# Patient Record
Sex: Male | Born: 1960 | Race: White | Hispanic: No | Marital: Single | Smoking: Current every day smoker
Health system: Southern US, Community
[De-identification: ages and names within clinical notes are randomized; demographics above are authoritative.]

## PROBLEM LIST (undated history)

## (undated) DIAGNOSIS — F32A Depression, unspecified: Secondary | ICD-10-CM

## (undated) DIAGNOSIS — I2510 Atherosclerotic heart disease of native coronary artery without angina pectoris: Secondary | ICD-10-CM

## (undated) DIAGNOSIS — Z8489 Family history of other specified conditions: Secondary | ICD-10-CM

## (undated) DIAGNOSIS — Z950 Presence of cardiac pacemaker: Secondary | ICD-10-CM

## (undated) DIAGNOSIS — K219 Gastro-esophageal reflux disease without esophagitis: Secondary | ICD-10-CM

## (undated) DIAGNOSIS — I10 Essential (primary) hypertension: Secondary | ICD-10-CM

## (undated) DIAGNOSIS — E039 Hypothyroidism, unspecified: Secondary | ICD-10-CM

## (undated) DIAGNOSIS — F329 Major depressive disorder, single episode, unspecified: Secondary | ICD-10-CM

## (undated) DIAGNOSIS — IMO0002 Reserved for concepts with insufficient information to code with codable children: Secondary | ICD-10-CM

## (undated) DIAGNOSIS — E785 Hyperlipidemia, unspecified: Secondary | ICD-10-CM

## (undated) HISTORY — PX: LUMBAR MICRODISCECTOMY: SHX99

## (undated) HISTORY — DX: Atherosclerotic heart disease of native coronary artery without angina pectoris: I25.10

## (undated) HISTORY — PX: APPENDECTOMY: SHX54

## (undated) HISTORY — DX: Presence of cardiac pacemaker: Z95.0

## (undated) HISTORY — DX: Hyperlipidemia, unspecified: E78.5

## (undated) HISTORY — PX: ABDOMINOPLASTY: SUR9

## (undated) HISTORY — PX: SQUAMOUS CELL CARCINOMA EXCISION: SHX2433

## (undated) HISTORY — DX: Hypothyroidism, unspecified: E03.9

## (undated) HISTORY — PX: TONSILLECTOMY: SUR1361

## (undated) HISTORY — DX: Depression, unspecified: F32.A

## (undated) HISTORY — PX: BACK SURGERY: SHX140

## (undated) HISTORY — DX: Major depressive disorder, single episode, unspecified: F32.9

---

## 1998-12-18 ENCOUNTER — Ambulatory Visit (HOSPITAL_COMMUNITY): Admission: RE | Admit: 1998-12-18 | Discharge: 1998-12-18 | Payer: Self-pay | Admitting: *Deleted

## 2000-03-05 ENCOUNTER — Ambulatory Visit (HOSPITAL_COMMUNITY): Admission: RE | Admit: 2000-03-05 | Discharge: 2000-03-05 | Payer: Self-pay | Admitting: *Deleted

## 2002-03-26 ENCOUNTER — Encounter: Payer: Self-pay | Admitting: Family Medicine

## 2002-03-26 ENCOUNTER — Inpatient Hospital Stay (HOSPITAL_COMMUNITY): Admission: AD | Admit: 2002-03-26 | Discharge: 2002-03-28 | Payer: Self-pay | Admitting: Family Medicine

## 2002-03-27 HISTORY — PX: CARDIAC CATHETERIZATION: SHX172

## 2002-09-26 ENCOUNTER — Encounter: Payer: Self-pay | Admitting: Emergency Medicine

## 2002-09-26 ENCOUNTER — Emergency Department (HOSPITAL_COMMUNITY): Admission: EM | Admit: 2002-09-26 | Discharge: 2002-09-26 | Payer: Self-pay | Admitting: Emergency Medicine

## 2004-05-13 ENCOUNTER — Ambulatory Visit (HOSPITAL_COMMUNITY): Admission: RE | Admit: 2004-05-13 | Discharge: 2004-05-13 | Payer: Self-pay | Admitting: Family Medicine

## 2005-08-26 ENCOUNTER — Ambulatory Visit (HOSPITAL_COMMUNITY): Admission: RE | Admit: 2005-08-26 | Discharge: 2005-08-26 | Payer: Self-pay | Admitting: Neurosurgery

## 2005-09-14 ENCOUNTER — Ambulatory Visit (HOSPITAL_COMMUNITY): Admission: RE | Admit: 2005-09-14 | Discharge: 2005-09-15 | Payer: Self-pay | Admitting: Neurosurgery

## 2005-11-02 ENCOUNTER — Ambulatory Visit (HOSPITAL_COMMUNITY): Admission: RE | Admit: 2005-11-02 | Discharge: 2005-11-02 | Payer: Self-pay | Admitting: Neurosurgery

## 2005-11-09 ENCOUNTER — Ambulatory Visit (HOSPITAL_COMMUNITY): Admission: RE | Admit: 2005-11-09 | Discharge: 2005-11-10 | Payer: Self-pay | Admitting: Neurosurgery

## 2008-02-24 ENCOUNTER — Encounter: Admission: RE | Admit: 2008-02-24 | Discharge: 2008-02-24 | Payer: Self-pay | Admitting: Neurosurgery

## 2009-11-27 HISTORY — PX: APPENDECTOMY: SHX54

## 2010-09-15 ENCOUNTER — Ambulatory Visit (HOSPITAL_COMMUNITY): Admission: RE | Admit: 2010-09-15 | Discharge: 2010-09-15 | Payer: Self-pay | Admitting: General Surgery

## 2010-09-15 ENCOUNTER — Inpatient Hospital Stay (HOSPITAL_COMMUNITY): Admission: AD | Admit: 2010-09-15 | Discharge: 2010-09-17 | Payer: Self-pay | Admitting: General Surgery

## 2010-09-16 ENCOUNTER — Encounter (INDEPENDENT_AMBULATORY_CARE_PROVIDER_SITE_OTHER): Payer: Self-pay | Admitting: General Surgery

## 2011-02-08 LAB — CBC
HCT: 38.1 % — ABNORMAL LOW (ref 39.0–52.0)
HCT: 40.6 % (ref 39.0–52.0)
Hemoglobin: 13.2 g/dL (ref 13.0–17.0)
Hemoglobin: 13.9 g/dL (ref 13.0–17.0)
MCH: 32.7 pg (ref 26.0–34.0)
MCH: 32.8 pg (ref 26.0–34.0)
MCHC: 34.4 g/dL (ref 30.0–36.0)
MCHC: 34.5 g/dL (ref 30.0–36.0)
MCV: 94.8 fL (ref 78.0–100.0)
MCV: 95.3 fL (ref 78.0–100.0)
Platelets: 226 10*3/uL (ref 150–400)
Platelets: 275 10*3/uL (ref 150–400)
RBC: 4.02 MIL/uL — ABNORMAL LOW (ref 4.22–5.81)
RBC: 4.26 MIL/uL (ref 4.22–5.81)
RDW: 12.9 % (ref 11.5–15.5)
RDW: 13.1 % (ref 11.5–15.5)
WBC: 8.1 10*3/uL (ref 4.0–10.5)
WBC: 9.3 10*3/uL (ref 4.0–10.5)

## 2011-02-08 LAB — DIFFERENTIAL
Basophils Absolute: 0 10*3/uL (ref 0.0–0.1)
Basophils Absolute: 0 10*3/uL (ref 0.0–0.1)
Basophils Relative: 0 % (ref 0–1)
Basophils Relative: 1 % (ref 0–1)
Eosinophils Absolute: 0.2 10*3/uL (ref 0.0–0.7)
Eosinophils Absolute: 0.2 10*3/uL (ref 0.0–0.7)
Eosinophils Relative: 2 % (ref 0–5)
Eosinophils Relative: 2 % (ref 0–5)
Lymphocytes Relative: 19 % (ref 12–46)
Lymphocytes Relative: 25 % (ref 12–46)
Lymphs Abs: 1.8 10*3/uL (ref 0.7–4.0)
Lymphs Abs: 2.1 10*3/uL (ref 0.7–4.0)
Monocytes Absolute: 0.6 10*3/uL (ref 0.1–1.0)
Monocytes Absolute: 1 10*3/uL (ref 0.1–1.0)
Monocytes Relative: 11 % (ref 3–12)
Monocytes Relative: 7 % (ref 3–12)
Neutro Abs: 5.3 10*3/uL (ref 1.7–7.7)
Neutro Abs: 6.2 10*3/uL (ref 1.7–7.7)
Neutrophils Relative %: 65 % (ref 43–77)
Neutrophils Relative %: 67 % (ref 43–77)

## 2011-02-08 LAB — BASIC METABOLIC PANEL
BUN: 11 mg/dL (ref 6–23)
BUN: 7 mg/dL (ref 6–23)
CO2: 24 mEq/L (ref 19–32)
CO2: 25 mEq/L (ref 19–32)
Calcium: 8.7 mg/dL (ref 8.4–10.5)
Calcium: 9.3 mg/dL (ref 8.4–10.5)
Chloride: 104 mEq/L (ref 96–112)
Chloride: 108 mEq/L (ref 96–112)
Creatinine, Ser: 0.86 mg/dL (ref 0.4–1.5)
Creatinine, Ser: 0.87 mg/dL (ref 0.4–1.5)
GFR calc Af Amer: >60 mL/min (ref >60)
GFR calc Af Amer: >60 mL/min (ref >60)
GFR calc non Af Amer: >60 mL/min (ref >60)
GFR calc non Af Amer: >60 mL/min (ref >60)
Glucose, Bld: 94 mg/dL (ref 70–99)
Glucose, Bld: 98 mg/dL (ref 70–99)
Potassium: 4 mEq/L (ref 3.5–5.1)
Potassium: 4.2 mEq/L (ref 3.5–5.1)
Sodium: 137 mEq/L (ref 135–145)
Sodium: 139 mEq/L (ref 135–145)

## 2011-04-14 NOTE — Op Note (Signed)
NAMETIMM, BONENBERGER               ACCOUNT NO.:  [id number]   MEDICAL RECORD NO.:  [id number]          PATIENT TYPE:  AMB   LOCATION:  SDS                          FACILITY:  [facility]   PHYSICIAN:  Danae Orleans. Venetia Maxon, M.D.  DATE OF BIRTH:  August 29, 1961   DATE OF PROCEDURE:  09/14/2005  DATE OF DISCHARGE:                                 OPERATIVE REPORT   PREOPERATIVE DIAGNOSIS:  L4-5 herniated lumbar disk on the left with  spondylosis, degenerative disk disease and lumbar radiculopathy.   POSTOPERATIVE DIAGNOSIS:  L4-5 herniated lumbar disk on the left with  spondylosis, degenerative disk disease and lumbar radiculopathy.   OPERATION PERFORMED:  Left L4-5 microdiskectomy with microdissection.   SURGEON:  Danae Orleans. Venetia Maxon, M.D.   ANESTHESIA:  General endotracheal.   ESTIMATED BLOOD LOSS:  Minimal.   COMPLICATIONS:  None.   DISPOSITION:  Recovery.   INDICATIONS FOR PROCEDURE:  Bill Castro is a 50 year old man with a large  free fragment disk herniation at the L4-5 level on the left.  It was elected  to take him to surgery for microdiskectomy.   DESCRIPTION OF PROCEDURE:  Bill Castro was brought to the operating room.  Following the satisfactory and uncomplicated induction of general  endotracheal anesthesia and placement of intravenous lines, the patient was  placed in a prone position on the Wilson frame.  The low back was then  prepped and draped in the usual sterile fashion.  The area of planned  incision was infiltrated with 0.25% Marcaine, 0.5% lidocaine, 1:200,000  epinephrine.  A small incision was made in the midline overlying the L4-5  interspace and carried through subcutaneous fat to the lumbodorsal fascia  which was incised on the left side of midline.  Subperiosteal dissection was  performed exposing the  interspace.  A marker probe was placed at this level  and intraoperative x-ray confirmed this to be the correct level.  Using a  high speed drill under loupe  magnification, a laminotomy was performed of L4  as well as a medial facetectomy.  A foraminotomy was performed overlying the  L5 nerve root with the removal by Kerrison rongeur of the superior aspect of  the L5 lamina.  The ligamentum flavum was then detached and removed in  piecemeal fashion using a variety of Kerrison rongeurs.  The lateral recess  was identified and in this location there was a fairly large fragment of  herniated disk material.  A microscope was brought into the field and using  microdissection technique, the L5 nerve root was mobilized medially and  multiple fragments of disk material were removed with resultant significant  decompression of the L5 nerve root and thecal sac.  There was a small  spondylitic ridge that remained but there did not appear to be free disk  material and there did not appear to be any significant residual compression  of the nerve root.  The annulus was inspected and while there was a bulge of  the annulus, there did not appear to be significant nerve root compression  from this and consequently, it was elected not to  incise the annulus and  remove any additional disk material.  Hemostasis was assured.  The wound was  copiously irrigated with bacitracin saline.  The nerve root was then bathed  in 2 mL of fentanyl and 80 mg of Depo-Medrol and the self-retaining  retractor was removed.  The lumbodorsal fascia was closed with 0 Vicryl  suture.  The subcutaneous tissue were reapproximated with 2-0 Vicryl  interrupted inverted sutures and the skin edges were reapproximated with  interrupted 3-0 Vicryl interrupted inverted sutures.  The wound was dressed  with DermaBond.  The patient was extubated in the operating room and taken  to the recovery room in stable and satisfactory condition having tolerated  the operation well.  Counts were correct at the end of the case.      Danae Orleans. Venetia Maxon, M.D.  Electronically Signed     JDS/MEDQ  D:   09/14/2005  T:  09/14/2005  Job:  [id number]

## 2011-04-14 NOTE — H&P (Signed)
[facility]  Patient:    Bill Castro, Bill Castro Visit Number: [id number] MRN: [id number]          Service Type: MED Location: [phone] Attending Physician:  Virgina Evener Dictated by:   Colette Ribas, M.D. Admit Date:  03/27/2002                           History and Physical  ADMISSION DIAGNOSIS:  Chest pain.  PRIMARY PHYSICIAN:  [facility].  HISTORY OF PRESENT ILLNESS:  [age]-year-old gentleman, who yesterday afternoon had acute onset of left-sided chest pain radiating to the neck, associated nausea, some diaphoresis and shortness of breath.  Seemed to last all evening and when he went to bed it was still present.  It was quite intense at first. He felt this could be just stomach related and did not think much about it, but almost came to the emergency department.  Today he came to the doctors office completely chest pain-free, and no chest pain all day today.  He had been taking over-the-counter cold medicines for a little bit of nasal congestion.  He has been hypertensive in the past but blood pressure well controlled now that he has lost 70 pounds.  Strong family history of coronary artery disease and he is a smoker, as well as has a history of hyperlipidemia.  In the office he was quite stable, with no complaints whatsoever, but on EKG showed ST elevation in pericordial leads as well as J-point elevation and peaked T waves.  He had some inferior lead elevation as well.  Due to these changes it was felt that he could have had a myocardial event and he must be admitted to the hospital.  PAST MEDICAL HISTORY:  Hypertension.  PAST SURGICAL HISTORY:  "Tumor" removed from the left side of his skin ten years ago.  MEDICATIONS:  None.  ALLERGIES:  1. PENICILLIN.  2. ERYTHROMYCIN.  SOCIAL HISTORY:  Tobacco use.  No alcohol use.  No cocaine use.  Occupation, works Armed forces operational officer for [employer].  FAMILY HISTORY:  Quite strong  for coronary artery disease and CVAs.  PHYSICAL EXAMINATION:  VITAL SIGNS:  Blood pressure 110/80, pulse 72, respirations 18, TEMP 98.0 degrees.  Weight 217 pounds.  GENERAL:  Pleasant, talkative gentleman in no acute distress.  HEENT:  Normocephalic, atraumatic.  PERRL.  EOMI.  Nasopharynx and oropharynx clear.  NECK:  Supple.  CHEST:  Clear to auscultation bilaterally.  CARDIOVASCULAR:  Regular rate and rhythm.  Normal S1 and S2.  No lifts, heaves, gallops or rubs.  ABDOMEN:  Bowel sounds positive.  Soft, nontender, nondistended.  No hepatosplenomegaly.  No masses.  EXTREMITIES:  No clubbing, cyanosis, or edema.  LABORATORY DATA:  ECGs as stated above.  ASSESSMENT:  [age]-year-old gentleman with chest pain.  Rule out myocardial cause.  PLAN:  1. Admit for monitoring and serial enzymes, CPK, troponin, MB x3.  2. ECG daily.  3. Monitor.  4. Aspirin 325 mg q.d.  5. Heparinize.  6. Consult Dr. Domingo Sep, who will see the patient this afternoon and     will discuss with her further interventions. Dictated by:   Colette Ribas, M.D. Attending Physician:  Virgina Evener DD:  03/26/02 TD:  03/27/02 Job: [id number]

## 2011-04-14 NOTE — Op Note (Signed)
NAMEMICHIO, THIER               ACCOUNT NO.:  [id number]   MEDICAL RECORD NO.:  [id number]          PATIENT TYPE:  OIB   LOCATION:  3007                         FACILITY:  [facility]   PHYSICIAN:  Danae Orleans. Venetia Maxon, M.D.  DATE OF BIRTH:  August 23, 1961   DATE OF PROCEDURE:  11/09/2005  DATE OF DISCHARGE:  11/10/2005                                 OPERATIVE REPORT   PREOPERATIVE DIAGNOSIS:  Recurrent herniated lumbar disc L4-L5 left with  spondylosis, degenerative disc disease, and radiculopathy.   POSTOPERATIVE DIAGNOSIS:  Recurrent herniated lumbar disc L4-L5 left with  spondylosis, degenerative disc disease, and radiculopathy.   PROCEDURE:  Redo left L4-L5 microdiscectomy with microdissection.   SURGEON:  Danae Orleans. Venetia Maxon, M.D.   ASSISTANT:  Stefani Dama, M.D.   ANESTHESIA:  General endotracheal anesthesia.   ESTIMATED BLOOD LOSS:  Minimal.   COMPLICATIONS:  None.   DISPOSITION:  Recovery room.   INDICATIONS FOR PROCEDURE:  Bill Castro is a 50 year old man who  previously underwent left L4-L5 microdiscectomy on September 14, 2005, for a  large free fragment herniated disc at the L4-L5 level on the left.  He did  well postoperatively, but then had sudden recurrence of pain and underwent a  repeat MRI which showed a recurrent disc fragment.  At the time of the  initial surgery, the annulus was preserved and the fragment was removed  without more aggressive discectomy.  However, because of the large recurrent  disc herniation and the significant disc degeneration at this level, it was  felt appropriate at this point to remove recurrent disc material but also to  perform a discectomy with removal of disc material from the interspace, as  well.   DESCRIPTION OF PROCEDURE:  Bill Castro is brought to the operating room.  Following satisfactory uncomplicated induction of general endotracheal  anesthesia and the placement of intravenous lines, the patient was placed in  a prone  position on the Wilson frame.  His low back was then prepped and  draped in the usual sterile fashion.  The area of planned incision was  infiltrated with 0.25% Marcaine and 0.5% lidocaine with 1:200,000  epinephrine.  His previous incision was reopened and carried through about 2  inches of adipost tissue to the lumbodorsal fascia which was incised on the  left side of the midline.  Subperiosteal dissection was performed exposing  the L4 and L5 interspace.  A laminotomy defect was identified at this level  and a marker probe was placed and an interoperative x-ray confirmed correct  orientation at the L4-L5 level.  Subsequently, the lateral recess was  further decompressed with Kerrison rongeurs and laminotomy defect was  carried cephalad, somewhat.  The lateral thecal sac was identified.  There  was a fragment of herniated disc material in the lateral recess directly  compressing the L5 nerve root.  The fragment of disc material was removed  which resulted in significant decompression of the thecal sac and nerve  root.  The annulus was then inspected.  There was a hole in the annulus from  which the fragment had emanated and  the annulus appeared to be fairly  degenerated.  It was, therefore, elected to perform an annulotomy and to  remove the disc material from the interspace, as well.  An annulotomy was  performed with the retractor protecting the nerve root and the common dural  tube.  This was done using micro-dissection technique to mobilize the sac  and nerve root.  Subsequently, after the annulutotomy was performed, the  interspace was evacuated of disc material using a variety of pituitary  rongeurs.  A Coblation wand was then used at standard settings to coblate  the remaining loose disc material and this was used both medially and  laterally and subsequently residual disc material was removed with pituitary  rongeurs and curets were used to remove any remaining disc material that  was  felt to cause significant persistent compression.  The wound was then  copiously irrigated with Bacitracin saline and the operative bed was bathed  in 2 mL of Fentanyl and 80 mg Depo-Medrol.  The self-retaining retractor was  then removed.  The lumbodorsal fascia was closed with #1 Vicryl sutures, the  subcutaneous tissues were reapproximated with 2-0 Vicryl interrupted  inverted sutures, and the skin edges were reapproximated with interrupted 3-  0 Vicryl subcuticular stitch.  The wound was dressed with Dermabond.  The  patient was extubated in the operating room and taken to the recovery room  in stable condition having tolerated the operation well.  Counts were  correct at the end of the case.      Danae Orleans. Venetia Maxon, M.D.  Electronically Signed     JDS/MEDQ  D:  11/09/2005  T:  11/10/2005  Job:  [id number]

## 2011-04-14 NOTE — Discharge Summary (Signed)
[facility]  Patient:    Bill Castro, Bill Castro Visit Number: [id number] MRN: [id number]          Service Type: MED Location: [phone] Attending Physician:  Heinz Knuckles Dictated by:   Colette Ribas, M.D. Admit Date:  03/27/2002 Disc. Date: 03/27/02                             Discharge Summary  TRANSFER DIAGNOSES:  Chest pain, rule out myocardial infarction.  HISTORY OF PRESENT ILLNESS/PAST MEDICAL HISTORY:  Please see admission H&P.  HOSPITAL COURSE:  A 50 year old gentleman who had acute onset of left-sided substernal chest pain with associated symptoms.  He was put in for rule out protocol.  Dr. Domingo Sep and I talked about the patient right at admission and were strongly concerned that this could be cardiac.  We were quite surprised that both sets of enzymes at this point have been negative.  Despite this he was set up for transfer to [facility] for cardiac catheterization as the ECG changes and symptomatology is quite suspicious.  Vital signs have remained stable.  He has remained chest pain free overnight and is ready for transfer to [facility] today.  Please see progress note on day of transfer for laboratories and physical.  Transfer to [facility] today. Dictated by:   Colette Ribas, M.D. Attending Physician:  Heinz Knuckles DD:  03/27/02 TD:  03/28/02 Job: [id number]

## 2011-04-14 NOTE — Procedures (Signed)
[facility]  Patient:    Bill Castro, Bill Castro Visit Number: [id number] MRN: [id number]          Service Type: MED Location: [phone] Attending Physician:  Heinz Knuckles Dictated by:   Kari Baars, M.D. Proc. Date: 03/26/02 Admit Date:  03/27/2002                            EKG Interpretations  The rhythm is a sinus rhythm with a rate in the 70s.  There is a left ventricular hypertrophy by voltage criteria.  There is relatively wide spread ST-T wave changes which could be myocardial infarction, early repolarization. There are Q-waves inferiorly which are prominent in limb lead 3.  This could also be from inferior myocardial infarction.  Clinical correlation is suggested.  Abnormal electrocardiogram. Dictated by:   Kari Baars, M.D. Attending Physician:  Heinz Knuckles DD:  03/26/02 TD:  03/28/02 Job: [id number]

## 2011-04-14 NOTE — Discharge Summary (Signed)
[place]  Patient:    Bill Castro, Bill Castro Visit Number: [id number] MRN: [id number]          Service Type: MED Location: [phone] Attending Physician:  Virgina Evener Dictated by:   Marya Fossa, P.A. Admit Date:  03/27/2002 Discharge Date: 03/28/2002   CC:         Dr. Lenore Cordia, [place]   Discharge Summary  DATE OF BIRTH:  Dec 18, 1960  ADMISSION DIAGNOSES: 1. Chest pain, rule out myocardial infarction. 2. Abnormal EKG. 3. Hypertension. 4. History of obsessive-compulsive disorder. 5. Herniated disk. 6. Unknown lipid status. 7. Ongoing tobacco abuse.  DISCHARGE DIAGNOSES: 1. Chest pain, resolved, myocardial infarction ruled out with negative    enzymes status post cardiac catheterization revealing noncritical    coronary artery disease.  LV dysfunction 30-35% ejection fraction.    Medical therapy. 2. Abnormal EKG. 3. Hypertension. 4. History of obsessive-compulsive disorder. 5. Herniated disk. 6. Unknown lipid status. 7. Ongoing tobacco abuse - counseled on smoking cessation.  HISTORY OF PRESENT ILLNESS:  Bill Castro is a 50 year old male with OCD, hypertension, and herniated disk who experienced the first episode of chest pain around three oclock in the afternoon on March 25, 2002.  It continued all night.  At worst, it was 10/10.  In the morning, he began having recurrent chest pain and went and saw Dr. Zollie Pee.  He had shortness of breath, diaphoresis, and radiation to the left chest and bilaterally to the neck.  Dr. Zollie Pee asked Dr. Domingo Sep to evaluate the patient at [facility].  At that time, he was currently pain-free.  His cardiac risk factors include tobacco, hypertension, unknown lipid status, and family history.  EKG in the emergency room shows normal sinus rhythm with changes suggestive of an old inferior MI, and mild ST elevation diffusely.  Initial cardiac enzymes are pending.  Because  of the patients recent symptoms of chest pain and abnormal EKG, Dr. Domingo Sep feels it prudent to evaluate for unstable angina, rule out MI.  She will transfer him to [facility] for cardiac catheterization. IV heparin and IV nitroglycerin started, as well as beta-blocker and statins if LFTs okay.  PROCEDURE:  Cardiac catheterization by Dr. Lenise Herald Mar 27, 2002.  COMPLICATIONS:  None.  CONSULTATIONS:  None.  COURSE IN HOSPITAL:  Bill Castro was admitted to [facility] as a transfer from [facility] on Mar 27, 2002.  He was on IV heparin and IV nitroglycerin, and started on a beta-blocker as well as aspirin.  PREPROCEDURE LABORATORY STUDIES:  Potassium 3.9, BUN 11, creatinine 1.0.  LFTs within normal limits.  WBC 8.2, hemoglobin 14.7, and platelet 243.  INR 1.5. Cardiac enzymes negative.  PROCEDURE:  The patient was taken to the cardiac cath lab on Mar 27, 2002 by Dr. Lenise Herald.  This revealed a normal left main and normal LAD. Diagonal one had a 30% ostial lesion.  The circumflex was widely patent. There was a 20% ostial/proximal OM lesion.  The RCA was free of disease except at its distal-most portion.  Two small branches had 90% lesions.  EF, however, was low at 30-35% with global hypokinesis.  Dr. Jenne Campus recommended medical therapy of coronary artery disease.  He feels these small vessels are not responsible for the patients chest pain, however, if the patient has recurrent or calcitrant symptoms, we may need to attempt intervention.  He also does not feel that the small vessels are responsible for  his LV dysfunction and this is an idiopathic cardiomyopathy or cardiomyopathy of some other cause, possibly hypertension.  The patient tolerated the procedure well. On Mar 28, 2002, the patient continued to be stable.  BUN 11, creatinine 1.2, total cholesterol 159, triglycerides 118, HDL 45, and LDL 90.  The patient was felt stable for discharge to home.  A  ______ consult will be obtained before the patient is discharged.  He will be also seen by cardiac rehab.  DISCHARGE MEDICATIONS: 1. Coated aspirin 81 mg q.d. 2. Coreg 3.125 mg b.i.d. 3. Lanoxin 0.125 mg a day. 4. Altace 2.5 mg b.i.d. 5. Zocor 10 mg q.d. 6. Nitroglycerin as needed for chest pain.  DISCHARGE INSTRUCTIONS: 1. No strenuous activity, lifting more than five pounds, or driving for    two days. 2. May return to work Tuesday, Apr 01, 2002 without restrictions. 3. He is to stop smoking. 4. Low fat, low cholesterol, 2 gram, salt restriction. 5. May shower.  Do not soak in a tub for three days. 6. He is asked to call the office with any problems or questions.  Dr. Domingo Sep will see the patient back in followup on Thursday, May 15 at 9:45.  Of note, the new closure device was used, a Medtronic, on this patient and the groin remained stable. Dictated by:   Marya Fossa, P.A. Attending Physician:  Virgina Evener DD:  03/28/02 TD:  03/31/02 Job: [id number]

## 2011-04-14 NOTE — Cardiovascular Report (Signed)
[place]  Patient:    Bill Castro, Bill Castro Visit Number: [id number] MRN: [id number]          Service Type: MED Location: [phone] Attending Physician:  Virgina Evener Dictated by:   Lenise Herald, M.D. Proc. Date: 03/27/02 Admit Date:  03/27/2002 Discharge Date: 03/28/2002   CC:         Netta Cedars, M.D.   Cardiac Catheterization  PROCEDURES: 1. Left heart catheterization. 2. Coronary angiography. 3. Left ventriculogram.  ATTENDING:  Lenise Herald, M.D.  COMPLICATIONS:  None.  INDICATIONS:  Bill Castro is a 50 year old male patient of Dr. Kem Boroughs and [facility] with a history of recent chest pain.  He was seen at [facility] ER with mild ST elevation consistent with pericarditis; however, his symptoms were consistent with unstable angina.  He ruled out for myocardial infarction.  He is now referred for cardiac catheterization to define his coronary anatomy.  DESCRIPTION OF PROCEDURE:  After giving informed written consent, the patient was brought to the cardiac catheterization lab where his right and left groins were shaved, prepped, and draped in the usual sterile fashion.  ECG monitoring was established.  Using the modified Seldinger technique, a #6 French arterial sheath was inserted in the right femoral artery.  A #6 French diagnostic catheter was then used to perform diagnostic angiography.  CATHETERIZATION FINDINGS: 1. This revealed a large left main with no significant disease.  2. The LAD is a large vessel which coursed to the apex and gave rise to 1    diagonal branch.  The LAD has no significant disease.  The first diagonal    is a large vessel which bifurcates distally and has mild 30% ostial    narrowing.  3. The left coronary artery gives rise to a medium sized ramus intermedius    with no significant disease.  4. The left circumflex is a large vessel which coursed in the AV groove and   gave rise to 2 obtuse marginal branches.  The AV groove circumflex has no    significant disease.  The first OM is a large vessel which bifurcates    distally and has 20% proximal narrowing.  The second OM is a small vessel    with no significant disease.  5. The right coronary artery is a large vessel which is dominant and gives    rise to both PDA as well as a posterolateral branch.  The RCA has no    significant disease.  The PDA is a small vessel which bifurcates in the mid    segment.  Both of the distal segments of the PDA have 90% stenosis.  This    is a 1.5 to 2.0 vessel.  The PLA has no significant disease.  LEFT VENTRICULOGRAM:  Left ventriculogram reveals an EF of 30-35% with global hypokinesis.  HEMODYNAMICS:  Systemic arterial pressure 97/62.  LV systemic pressure 101/6. LVEDP of 14.  CONCLUSIONS:  Significant one-vessel coronary artery disease which appears to be a very small vessel and is not responsible for the patients low ejection fraction.  Medical management will be recommended at this time; however, should the symptoms persist, percutaneous coronary intervention could be attempted of his posterior descending artery. 2. Dictated by:   Lenise Herald, M.D. Attending Physician:  Virgina Evener DD:  03/27/02 TD:  03/29/02 Job: [id number]

## 2012-01-19 ENCOUNTER — Emergency Department (INDEPENDENT_AMBULATORY_CARE_PROVIDER_SITE_OTHER): Payer: BC Managed Care – PPO

## 2012-01-19 ENCOUNTER — Encounter (HOSPITAL_COMMUNITY): Payer: Self-pay | Admitting: *Deleted

## 2012-01-19 ENCOUNTER — Emergency Department (INDEPENDENT_AMBULATORY_CARE_PROVIDER_SITE_OTHER)
Admission: EM | Admit: 2012-01-19 | Discharge: 2012-01-19 | Disposition: A | Discharge status: Home / Self Care | Payer: BC Managed Care – PPO | Source: Home / Self Care | Attending: Emergency Medicine | Admitting: Emergency Medicine

## 2012-01-19 DIAGNOSIS — S62639A Displaced fracture of distal phalanx of unspecified finger, initial encounter for closed fracture: Secondary | ICD-10-CM

## 2012-01-19 NOTE — Progress Notes (Signed)
Orthopedic Tech Progress Note Patient Details:  Bill Castro 28-Jul-1961 [id number]  Type of Splint: Other (comment) (ulnar gutter) Splint Location: right hand Splint Interventions: Application    Nikki Dom 01/19/2012, 8:57 PM

## 2012-01-19 NOTE — Discharge Instructions (Signed)
Thank you for coming in today. You have a fracture of the base of the little finger, that involves the joint.  I would like you to see the orthopedic doctor preferably a hand doctor next week early.   Do not take the splint off. Use Tylenol or ibuprofen as needed for pain.  Finger Fracture (Phalangeal) A broken bone of the finger (phalangealfracture) is a common injury for athletes. A single injury (trauma) is likely to fracture multiple bones on the same or different fingers. SYMPTOMS   Severe pain, at the time of injury.   Pain, tenderness, swelling, and later bruising of the finger and then the hand.   Visible deformity, if the fracture is complete and the bone fragments separate enough to distort the normal shape.   Numbness or coldness from swelling in the finger, causing pressure on blood vessels or nerves (uncommon).  CAUSES  Direct or indirect injury (trauma) to the finger.  RISK INCREASES WITH:   Contact sports (football, rugby) or other sports where injury to the hand is likely (soccer, baseball, basketball).   Sports that require hitting (boxing, martial arts).   History of bone or joint disease, such as osteoporosis, or previous bone restraint.   Poor hand strength and flexibility.  PREVENTION   For contact sports, wear appropriate and properly fitted protective equipment for the hand.   Learn and use proper technique when hitting, punching, or landing after a fall.   If you had a previous finger injury or hand restraint, use tape or padding to protect the finger when playing sports where finger injury is likely.  PROGNOSIS  With proper treatment and normal alignment of the bones, healing can usually be expected in 4 to 6 weeks. Sometimes, surgery is needed.  RELATED COMPLICATIONS   Fracture does not heal (nonunion).   Bone heals in wrong position (malunion).   Chronic pain, stiffness, or swelling of the hand.   Excessive bleeding, causing pressure on nerves  and blood vessels.   Unstable or arthritic joint, following repeated injury or delayed treatment.   Hindrance of normal growth in children.   Infection in skin broken over the fracture (open fracture) or at the incision or pin sites from surgery.   Shortening of injured bones.   Bony bumps or loss of shape of the fingers.   Arthritic or stiff finger joint, if the fracture reaches the joint.  TREATMENT  If the bones are properly aligned, treatment involves ice and medicine to reduce pain and inflammation. Then, the finger is restrained for 4 or more weeks, to allow for healing. If the fracture is out of alignment (displaced), involves more than one bone, or involves a joint, surgery is usually advised. Surgery often involves placing removable pins, screws, and sometimes plates, to hold the bones in proper alignment. After restraint (with or without surgery), stretching and strengthening exercises are needed. Exercises may be completed at home or with a therapist. For certain sports, wearing a splint or having the finger taped during future activity is advised.  MEDICATION   If pain medicine is needed, nonsteroidal anti-inflammatory medicines (aspirin and ibuprofen), or other minor pain relievers (acetaminophen), are often advised.   Do not take pain medicine for 7 days before surgery.   Prescription pain relievers are usually prescribed only after surgery. Use only as directed and only as much as you need.  COLD THERAPY   Cold treatment (icing) relieves pain and reduces inflammation. Cold treatment should be applied for 10 to 15  minutes every 2 to 3 hours, and immediately after activity that aggravates your symptoms. Use ice packs or an ice massage.  SEEK MEDICAL CARE IF:   Pain, tenderness, or swelling gets worse, despite treatment.   You experience pain, numbness, or coldness in the hand.   Blue, gray, or dark color appears in the fingernails.   Any of the following occur after  surgery: fever, increased pain, swelling, redness, drainage of fluids, or bleeding in the affected area.   New, unexplained symptoms develop. (Drugs used in treatment may produce side effects.)  Document Released: 11/13/2005 Document Revised: 07/26/2011 Document Reviewed: 02/25/2009 [facility] Patient Information 2012 [place], [place].

## 2012-01-19 NOTE — ED Provider Notes (Signed)
Bill Castro is a 51 y.o. male who presents to Urgent Care today for today we'll leave in the vets office with his dog he tried to stop a door from closing with his right hand.  The metal bar on the door crush his were signed of his right hand resulting in immediate pain and swelling.  He has normal sensation to his fingertips but has noted some swelling.  He can move his hand normally although it does have pain. He denies any wrist pain or difficulty with motion.    PMH reviewed. Significant for hypertension ROS as above otherwise neg Medications reviewed. No current facility-administered medications for this encounter.   Current Outpatient Prescriptions  Medication Sig Dispense Refill  . aspirin 81 MG tablet Take 81 mg by mouth daily.      . ramipril (ALTACE) 5 MG tablet Take 5 mg by mouth daily.        Exam:  There were no vitals taken for this visit. Gen: Well NAD MSK: Fingers are neurovascularly intact with normal capillary refill.  Tender to palpation on fourth and fifth metacarpophalangeal joints. Tender to palpation on the distal fourth and fifth metacarpals.  Normal hand motion. Normal finger alignment with  a closed hand.  Hand x-ray shows small proximal right fifth phalangeal fracture involving the articular surface  Assessment and Plan: 51 year old male with proximal articular fifth phalangeal fracture.  Placed in ulnar gutter splint and referred to orthopedics (hand) for definitive management. I provided instructions and handout. Patient expresses understanding.     Clementeen Graham, MD 01/19/12 2059

## 2012-01-19 NOTE — ED Notes (Signed)
Pt slammed right hand in door.

## 2012-08-15 ENCOUNTER — Other Ambulatory Visit (HOSPITAL_COMMUNITY): Payer: Self-pay | Admitting: Internal Medicine

## 2012-08-15 ENCOUNTER — Ambulatory Visit (HOSPITAL_COMMUNITY)
Admission: RE | Admit: 2012-08-15 | Discharge: 2012-08-15 | Disposition: A | Discharge status: Home / Self Care | Payer: BC Managed Care – PPO | Source: Ambulatory Visit | Attending: Internal Medicine | Admitting: Internal Medicine

## 2012-08-15 DIAGNOSIS — R059 Cough, unspecified: Secondary | ICD-10-CM | POA: Insufficient documentation

## 2012-08-15 DIAGNOSIS — R05 Cough: Secondary | ICD-10-CM

## 2012-08-15 DIAGNOSIS — J069 Acute upper respiratory infection, unspecified: Secondary | ICD-10-CM | POA: Insufficient documentation

## 2014-03-30 ENCOUNTER — Emergency Department (HOSPITAL_COMMUNITY)
Admission: EM | Admit: 2014-03-30 | Discharge: 2014-03-30 | Disposition: A | Discharge status: Home / Self Care | Payer: BC Managed Care – PPO | Source: Home / Self Care | Attending: Emergency Medicine | Admitting: Emergency Medicine

## 2014-03-30 ENCOUNTER — Encounter (HOSPITAL_COMMUNITY): Payer: Self-pay | Admitting: Emergency Medicine

## 2014-03-30 DIAGNOSIS — J329 Chronic sinusitis, unspecified: Secondary | ICD-10-CM

## 2014-03-30 MED ORDER — SULFAMETHOXAZOLE-TMP DS 800-160 MG PO TABS: 1.0000 | ORAL_TABLET | Freq: Two times a day (BID) | ORAL | Status: DC

## 2014-03-30 NOTE — ED Provider Notes (Signed)
Medical screening examination/treatment/procedure(s) were performed by non-physician practitioner and as supervising physician I was immediately available for consultation/collaboration.  Enrique Manganaro, M.D.  Tawn Fitzner C [place] Whedbee, MD 03/30/14 1934 

## 2014-03-30 NOTE — Discharge Instructions (Signed)

## 2014-03-30 NOTE — ED Provider Notes (Signed)
CSN: [id number]     Arrival date & time 03/30/14  0918 History   First MD Initiated Contact with Patient 03/30/14 [phone]     Chief Complaint  Patient presents with  . Cough   (Consider location/radiation/quality/duration/timing/severity/associated sxs/prior Treatment) Patient is a 53 y.o. male presenting with cough. The history is provided by the patient. No language interpreter was used.  Cough Cough characteristics:  Productive and harsh Sputum characteristics:  Yellow Severity:  Moderate Onset quality:  Gradual Duration:  3 days Timing:  Constant Progression:  Worsening Chronicity:  New Smoker: no   Relieved by:  Nothing Worsened by:  Nothing tried Ineffective treatments:  None tried Associated symptoms: fever and sinus congestion   Associated symptoms: no chest pain     Past Medical History  Diagnosis Date  . Cardiomyopathy    Past Surgical History  Procedure Laterality Date  . Appendectomy     History reviewed. No pertinent family history. History  Substance Use Topics  . Smoking status: Current Every Day Smoker  . Smokeless tobacco: Not on file  . Alcohol Use: No    Review of Systems  Constitutional: Positive for fever.  Respiratory: Positive for cough.   Cardiovascular: Negative for chest pain.  All other systems reviewed and are negative.   Allergies  Erythromycin and Penicillins  Home Medications   Prior to Admission medications   Medication Sig Start Date End Date Taking? Authorizing Provider  BusPIRone HCl (BUSPAR PO) Take by mouth.   Yes Historical Provider, MD  Escitalopram Oxalate (LEXAPRO PO) Take by mouth.   Yes Historical Provider, MD  Levothyroxine Sodium (SYNTHROID PO) Take by mouth.   Yes Historical Provider, MD  aspirin 81 MG tablet Take 81 mg by mouth daily.    Historical Provider, MD  ramipril (ALTACE) 5 MG tablet Take 5 mg by mouth daily.    Historical Provider, MD   BP 132/78  Pulse 78  Temp(Src) 98.6 F (37 C) (Oral)  Resp 18   SpO2 100% Physical Exam  Nursing note and vitals reviewed. Constitutional: He is oriented to person, place, and time. He appears well-developed and well-nourished.  HENT:  Head: Normocephalic and atraumatic.  Right Ear: External ear normal.  Left Ear: External ear normal.  Nose: Nose normal.  Mouth/Throat: Oropharynx is clear and moist.  Tender maxillary sinuses bilat  Eyes: EOM are normal. Pupils are equal, round, and reactive to light.  Neck: Normal range of motion.  Pulmonary/Chest: Effort normal.  Abdominal: Soft. He exhibits no distension.  Musculoskeletal: Normal range of motion.  Neurological: He is alert and oriented to person, place, and time.  Skin: Skin is warm.  Psychiatric: He has a normal mood and affect.    ED Course  Procedures (including critical care time) Labs Review Labs Reviewed - No data to display  Imaging Review No results found.   MDM   1. Sinusitis    Bactrim DS [address]20    Lonia SkinnerLeslie K KensingtonSofia, New JerseyPA-C 03/30/14 1006

## 2014-03-30 NOTE — ED Notes (Signed)
Pt   Reports     Symptoms  Of         Congestion       Sinus  Drainage        And      Nasal  stuffyness     For  About 3  Days       Pt  Sitting  Upright on the  Exam table  Speaking in  Complete  sentances  And  Is  In no  Acute  Distress

## 2015-12-30 ENCOUNTER — Ambulatory Visit: Payer: Self-pay | Admitting: Cardiology

## 2016-02-18 ENCOUNTER — Encounter: Payer: Self-pay | Admitting: *Deleted

## 2016-02-21 ENCOUNTER — Ambulatory Visit: Payer: Self-pay | Admitting: Cardiology

## 2016-04-11 ENCOUNTER — Ambulatory Visit: Payer: Self-pay | Admitting: Cardiology

## 2017-09-09 ENCOUNTER — Encounter (HOSPITAL_COMMUNITY): Payer: Self-pay | Admitting: Emergency Medicine

## 2017-09-09 ENCOUNTER — Ambulatory Visit (HOSPITAL_COMMUNITY)
Admission: EM | Admit: 2017-09-09 | Discharge: 2017-09-09 | Disposition: A | Discharge status: Home / Self Care | Payer: Commercial Managed Care - PPO | Attending: Family Medicine | Admitting: Family Medicine

## 2017-09-09 DIAGNOSIS — S060X0A Concussion without loss of consciousness, initial encounter: Secondary | ICD-10-CM

## 2017-09-09 DIAGNOSIS — R51 Headache: Secondary | ICD-10-CM

## 2017-09-09 DIAGNOSIS — W228XXA Striking against or struck by other objects, initial encounter: Secondary | ICD-10-CM | POA: Diagnosis not present

## 2017-09-09 DIAGNOSIS — S0101XA Laceration without foreign body of scalp, initial encounter: Secondary | ICD-10-CM

## 2017-09-09 NOTE — ED Triage Notes (Signed)
Pt reports head inj last night ... sts he was throwing out the trash and bend over but when he raised his head he hit the corner of the cabinet  Has a small lac to top of scalp... Bleeding controlled... Denies LOC  C/o persistent HA, ringing on right ear.   A&O x4... NAD... Ambulatory

## 2017-09-09 NOTE — ED Provider Notes (Signed)
[facility]   [id number] 09/09/17 Arrival Time: 1201   SUBJECTIVE:  Bill Castro is a 56 y.o. male who presents to the urgent care with complaint of scalp laceration.  He had some bleeding and a persistent headache which is not worsening, not associated with N/V, and not associated with focal weakness.  He has had some tinnitus in the right ear.  He was standing up when he struck the left parietal area on a cabinet.  He did not sleep well despite taking Tylenol for pain.  No diplopia.  Patient does office work   Past Medical History:  Diagnosis Date  . Cardiomyopathy   . Depressive disorder   . Hyperlipidemia   . Hypothyroidism    Family History  Problem Relation Age of Onset  . Heart disease Father    Social History   Social History  . Marital status: Single    Spouse name: N/A  . Number of children: N/A  . Years of education: N/A   Occupational History  . Not on file.   Social History Main Topics  . Smoking status: Current Every Day Smoker  . Smokeless tobacco: Not on file  . Alcohol use No  . Drug use: No  . Sexual activity: Not on file   Other Topics Concern  . Not on file   Social History Narrative  . No narrative on file   No outpatient prescriptions have been marked as taking for the 09/09/17 encounter [facility] Encounter).   Allergies  Allergen Reactions  . Erythromycin   . Penicillins       ROS: As per HPI, remainder of ROS negative.   OBJECTIVE:   There were no vitals filed for this visit.   General appearance: alert; no distress Eyes: PERRL; EOMI; conjunctiva normal; normal fundi HENT: normocephalic; atraumatic; TMs normal, canal normal, external ears normal without trauma; nasal mucosa normal; oral mucosa normal Neck: supple Lungs:no respiratory distress Back: no CVA tenderness Extremities: no cyanosis or edema; symmetrical with no gross deformities Skin: warm and dry, dry 1/2 cm left parietal scalp  lac Neurologic: normal gait; grossly normal; CN III-XII intact Psychological: alert and cooperative; normal mood and affect      Labs:  Results for orders placed or performed during the hospital encounter of 54/27/06  Basic metabolic panel  Result Value Ref Range   Sodium 139 135 - 145 mEq/L   Potassium 4.2 3.5 - 5.1 mEq/L   Chloride 108 96 - 112 mEq/L   CO2 24 19 - 32 mEq/L   Glucose, Bld 98 70 - 99 mg/dL   BUN 11 6 - 23 mg/dL   Creatinine, Ser 0.86 0.4 - 1.5 mg/dL   Calcium 9.3 8.4 - 10.5 mg/dL   GFR calc non Af Amer >60 >60 mL/min   GFR calc Af Amer  >60 mL/min    >60        The eGFR has been calculated using the MDRD equation. This calculation has not been validated in all clinical situations. eGFR's persistently <60 mL/min signify possible Chronic Kidney Disease.  CBC  Result Value Ref Range   WBC 8.1 4.0 - 10.5 K/uL   RBC 4.26 4.22 - 5.81 MIL/uL   Hemoglobin 13.9 13.0 - 17.0 g/dL   HCT 40.6 39.0 - 52.0 %   MCV 95.3 78.0 - 100.0 fL   MCH 32.8 26.0 - 34.0 pg   MCHC 34.4 30.0 - 36.0 g/dL   RDW 12.9 11.5 - 15.5 %  Platelets 275 150 - 400 K/uL  Differential  Result Value Ref Range   Neutrophils Relative % 65 43 - 77 %   Neutro Abs 5.3 1.7 - 7.7 K/uL   Lymphocytes Relative 25 12 - 46 %   Lymphs Abs 2.1 0.7 - 4.0 K/uL   Monocytes Relative 7 3 - 12 %   Monocytes Absolute 0.6 0.1 - 1.0 K/uL   Eosinophils Relative 2 0 - 5 %   Eosinophils Absolute 0.2 0.0 - 0.7 K/uL   Basophils Relative 0 0 - 1 %   Basophils Absolute 0.0 0.0 - 0.1 K/uL  Basic metabolic panel  Result Value Ref Range   Sodium 137 135 - 145 mEq/L   Potassium 4.0 3.5 - 5.1 mEq/L   Chloride 104 96 - 112 mEq/L   CO2 25 19 - 32 mEq/L   Glucose, Bld 94 70 - 99 mg/dL   BUN 7 6 - 23 mg/dL   Creatinine, Ser 0.87 0.4 - 1.5 mg/dL   Calcium 8.7 8.4 - 10.5 mg/dL   GFR calc non Af Amer >60 >60 mL/min   GFR calc Af Amer  >60 mL/min    >60        The eGFR has been calculated using the MDRD  equation. This calculation has not been validated in all clinical situations. eGFR's persistently <60 mL/min signify possible Chronic Kidney Disease.  CBC  Result Value Ref Range   WBC 9.3 4.0 - 10.5 K/uL   RBC 4.02 (L) 4.22 - 5.81 MIL/uL   Hemoglobin 13.2 13.0 - 17.0 g/dL   HCT 38.1 (L) 39.0 - 52.0 %   MCV 94.8 78.0 - 100.0 fL   MCH 32.7 26.0 - 34.0 pg   MCHC 34.5 30.0 - 36.0 g/dL   RDW 13.1 11.5 - 15.5 %   Platelets 226 150 - 400 K/uL  Differential  Result Value Ref Range   Neutrophils Relative % 67 43 - 77 %   Neutro Abs 6.2 1.7 - 7.7 K/uL   Lymphocytes Relative 19 12 - 46 %   Lymphs Abs 1.8 0.7 - 4.0 K/uL   Monocytes Relative 11 3 - 12 %   Monocytes Absolute 1.0 0.1 - 1.0 K/uL   Eosinophils Relative 2 0 - 5 %   Eosinophils Absolute 0.2 0.0 - 0.7 K/uL   Basophils Relative 1 0 - 1 %   Basophils Absolute 0.0 0.0 - 0.1 K/uL    Labs Reviewed - No data to display  No results found.     ASSESSMENT & PLAN:  No diagnosis found.  No orders of the defined types were placed in this encounter.   Reviewed expectations re: course of current medical issues. Questions answered. Outlined signs and symptoms indicating need for more acute intervention. Patient verbalized understanding. After Visit Summary given.    Procedures:      Robyn Haber, MD 09/09/17 1234

## 2017-09-09 NOTE — Discharge Instructions (Signed)
No stressful work for several days.  Only work 6 hours tomorrow.  If headache worsens or new symptoms develop, go to emergency department for follow up CAT scan.  Tylenol may be used for pain up to 2000 mg per day in divided doses.

## 2017-12-19 ENCOUNTER — Encounter (HOSPITAL_COMMUNITY)
Admission: EM | Disposition: A | Discharge status: Home / Self Care | Payer: Self-pay | Source: Home / Self Care | Attending: Emergency Medicine

## 2017-12-19 ENCOUNTER — Observation Stay (HOSPITAL_COMMUNITY)
Admission: EM | Admit: 2017-12-19 | Discharge: 2017-12-20 | Disposition: A | Discharge status: Home / Self Care | Payer: Commercial Managed Care - PPO | Attending: Internal Medicine | Admitting: Internal Medicine

## 2017-12-19 ENCOUNTER — Inpatient Hospital Stay (HOSPITAL_COMMUNITY): Payer: Commercial Managed Care - PPO

## 2017-12-19 ENCOUNTER — Other Ambulatory Visit: Payer: Self-pay

## 2017-12-19 ENCOUNTER — Emergency Department (HOSPITAL_COMMUNITY): Payer: Commercial Managed Care - PPO

## 2017-12-19 ENCOUNTER — Encounter (HOSPITAL_COMMUNITY): Payer: Self-pay

## 2017-12-19 DIAGNOSIS — E039 Hypothyroidism, unspecified: Secondary | ICD-10-CM | POA: Insufficient documentation

## 2017-12-19 DIAGNOSIS — I442 Atrioventricular block, complete: Secondary | ICD-10-CM

## 2017-12-19 DIAGNOSIS — F17210 Nicotine dependence, cigarettes, uncomplicated: Secondary | ICD-10-CM | POA: Diagnosis not present

## 2017-12-19 DIAGNOSIS — Z79890 Hormone replacement therapy (postmenopausal): Secondary | ICD-10-CM | POA: Diagnosis not present

## 2017-12-19 DIAGNOSIS — R202 Paresthesia of skin: Secondary | ICD-10-CM

## 2017-12-19 DIAGNOSIS — R29810 Facial weakness: Secondary | ICD-10-CM | POA: Insufficient documentation

## 2017-12-19 DIAGNOSIS — I469 Cardiac arrest, cause unspecified: Secondary | ICD-10-CM

## 2017-12-19 DIAGNOSIS — E785 Hyperlipidemia, unspecified: Secondary | ICD-10-CM | POA: Diagnosis not present

## 2017-12-19 DIAGNOSIS — Z880 Allergy status to penicillin: Secondary | ICD-10-CM | POA: Diagnosis not present

## 2017-12-19 DIAGNOSIS — Z79899 Other long term (current) drug therapy: Secondary | ICD-10-CM | POA: Insufficient documentation

## 2017-12-19 DIAGNOSIS — Z881 Allergy status to other antibiotic agents status: Secondary | ICD-10-CM | POA: Insufficient documentation

## 2017-12-19 DIAGNOSIS — R55 Syncope and collapse: Secondary | ICD-10-CM

## 2017-12-19 DIAGNOSIS — Z950 Presence of cardiac pacemaker: Secondary | ICD-10-CM

## 2017-12-19 DIAGNOSIS — I429 Cardiomyopathy, unspecified: Secondary | ICD-10-CM | POA: Insufficient documentation

## 2017-12-19 HISTORY — DX: Reserved for concepts with insufficient information to code with codable children: IMO0002

## 2017-12-19 HISTORY — PX: INSERT / REPLACE / REMOVE PACEMAKER: SUR710

## 2017-12-19 HISTORY — DX: Presence of cardiac pacemaker: Z95.0

## 2017-12-19 HISTORY — DX: Family history of other specified conditions: Z84.89

## 2017-12-19 HISTORY — PX: PACEMAKER IMPLANT: EP1218

## 2017-12-19 LAB — CBC
HCT: 45.5 % (ref 39.0–52.0)
Hemoglobin: 15.2 g/dL (ref 13.0–17.0)
MCH: 31.7 pg (ref 26.0–34.0)
MCHC: 33.4 g/dL (ref 30.0–36.0)
MCV: 95 fL (ref 78.0–100.0)
Platelets: 300 10*3/uL (ref 150–400)
RBC: 4.79 MIL/uL (ref 4.22–5.81)
RDW: 12.9 % (ref 11.5–15.5)
WBC: 9.6 10*3/uL (ref 4.0–10.5)

## 2017-12-19 LAB — COMPREHENSIVE METABOLIC PANEL
ALT: 21 U/L (ref 17–63)
AST: 20 U/L (ref 15–41)
Albumin: 4.2 g/dL (ref 3.5–5.0)
Alkaline Phosphatase: 77 U/L (ref 38–126)
Anion gap: 11 (ref 5–15)
BUN: 12 mg/dL (ref 6–20)
CO2: 23 mmol/L (ref 22–32)
Calcium: 8.9 mg/dL (ref 8.9–10.3)
Chloride: 105 mmol/L (ref 101–111)
Creatinine, Ser: 0.88 mg/dL (ref 0.61–1.24)
GFR calc Af Amer: >60 mL/min (ref >60)
GFR calc non Af Amer: >60 mL/min (ref >60)
Glucose, Bld: 120 mg/dL — ABNORMAL HIGH (ref 65–99)
Potassium: 3.5 mmol/L (ref 3.5–5.1)
Sodium: 139 mmol/L (ref 135–145)
Total Bilirubin: 0.2 mg/dL — ABNORMAL LOW (ref 0.3–1.2)
Total Protein: 7.3 g/dL (ref 6.5–8.1)

## 2017-12-19 LAB — I-STAT CHEM 8, ED
BUN: 10 mg/dL (ref 6–20)
Calcium, Ion: 1.11 mmol/L — ABNORMAL LOW (ref 1.15–1.40)
Chloride: 106 mmol/L (ref 101–111)
Creatinine, Ser: 0.9 mg/dL (ref 0.61–1.24)
Glucose, Bld: 115 mg/dL — ABNORMAL HIGH (ref 65–99)
HCT: 47 % (ref 39.0–52.0)
Hemoglobin: 16 g/dL (ref 13.0–17.0)
Potassium: 3.6 mmol/L (ref 3.5–5.1)
Sodium: 142 mmol/L (ref 135–145)
TCO2: 23 mmol/L (ref 22–32)

## 2017-12-19 LAB — DIFFERENTIAL
Basophils Absolute: 0.1 10*3/uL (ref 0.0–0.1)
Basophils Relative: 1 %
Eosinophils Absolute: 0.4 10*3/uL (ref 0.0–0.7)
Eosinophils Relative: 4 %
Lymphocytes Relative: 34 %
Lymphs Abs: 3.3 10*3/uL (ref 0.7–4.0)
Monocytes Absolute: 0.8 10*3/uL (ref 0.1–1.0)
Monocytes Relative: 8 %
Neutro Abs: 5.1 10*3/uL (ref 1.7–7.7)
Neutrophils Relative %: 53 %

## 2017-12-19 LAB — MAGNESIUM: Magnesium: 2.2 mg/dL (ref 1.7–2.4)

## 2017-12-19 LAB — APTT: aPTT: 26 seconds (ref 24–36)

## 2017-12-19 LAB — ECHOCARDIOGRAM COMPLETE
Height: 72 in
Weight: 3200 oz

## 2017-12-19 LAB — PROTIME-INR
INR: 0.88
Prothrombin Time: 11.9 seconds (ref 11.4–15.2)

## 2017-12-19 LAB — I-STAT TROPONIN, ED: Troponin i, poc: 0.01 ng/mL (ref 0.00–0.08)

## 2017-12-19 LAB — T4, FREE: Free T4: 0.77 ng/dL (ref 0.61–1.12)

## 2017-12-19 LAB — TSH: TSH: 3.883 u[IU]/mL (ref 0.350–4.500)

## 2017-12-19 LAB — ETHANOL: Alcohol, Ethyl (B): <10 mg/dL (ref <10)

## 2017-12-19 SURGERY — PACEMAKER IMPLANT

## 2017-12-19 MED ORDER — MIDAZOLAM HCL 5 MG/5ML IJ SOLN
INTRAMUSCULAR | Status: AC
  Filled 2017-12-19: qty 5

## 2017-12-19 MED ORDER — FENTANYL CITRATE (PF) 100 MCG/2ML IJ SOLN
INTRAMUSCULAR | Status: AC
  Filled 2017-12-19: qty 2

## 2017-12-19 MED ORDER — VANCOMYCIN HCL IN DEXTROSE 1-5 GM/200ML-% IV SOLN
1000.0000 mg | Freq: Two times a day (BID) | INTRAVENOUS | Status: AC
  Administered 2017-12-20: 1000 mg via INTRAVENOUS
  Filled 2017-12-19: qty 200

## 2017-12-19 MED ORDER — SODIUM CHLORIDE 0.9% FLUSH: 3.0000 mL | Freq: Two times a day (BID) | INTRAVENOUS | Status: DC

## 2017-12-19 MED ORDER — LIDOCAINE HCL 1 % IJ SOLN
INTRAMUSCULAR | Status: AC
  Filled 2017-12-19: qty 20

## 2017-12-19 MED ORDER — YOU HAVE A PACEMAKER BOOK
Freq: Once | Status: AC
  Administered 2017-12-20: 06:00:00
  Filled 2017-12-19: qty 1

## 2017-12-19 MED ORDER — SODIUM CHLORIDE 0.9 % IV SOLN: INTRAVENOUS | Status: DC

## 2017-12-19 MED ORDER — POTASSIUM CHLORIDE 10 MEQ/100ML IV SOLN
10.0000 meq | Freq: Once | INTRAVENOUS | Status: AC
  Administered 2017-12-19: 10 meq via INTRAVENOUS
  Filled 2017-12-19: qty 100

## 2017-12-19 MED ORDER — FENTANYL CITRATE (PF) 100 MCG/2ML IJ SOLN
INTRAMUSCULAR | Status: DC | PRN
  Administered 2017-12-19 (×3): 25 ug via INTRAVENOUS

## 2017-12-19 MED ORDER — MIDAZOLAM HCL 5 MG/5ML IJ SOLN
INTRAMUSCULAR | Status: DC | PRN
  Administered 2017-12-19: 2 mg via INTRAVENOUS

## 2017-12-19 MED ORDER — ASPIRIN EC 81 MG PO TBEC: 81.0000 mg | DELAYED_RELEASE_TABLET | Freq: Every day | ORAL | Status: DC

## 2017-12-19 MED ORDER — HEPARIN (PORCINE) IN NACL 2-0.9 UNIT/ML-% IJ SOLN
INTRAMUSCULAR | Status: AC
  Filled 2017-12-19: qty 500

## 2017-12-19 MED ORDER — MIDAZOLAM HCL 5 MG/5ML IJ SOLN
INTRAMUSCULAR | Status: DC | PRN
  Administered 2017-12-19 (×2): 2 mg via INTRAVENOUS

## 2017-12-19 MED ORDER — SODIUM CHLORIDE 0.9 % IV SOLN: 250.0000 mL | INTRAVENOUS | Status: DC | PRN

## 2017-12-19 MED ORDER — SODIUM CHLORIDE 0.9% FLUSH: 3.0000 mL | INTRAVENOUS | Status: DC | PRN

## 2017-12-19 MED ORDER — ACETAMINOPHEN 325 MG PO TABS
650.0000 mg | ORAL_TABLET | ORAL | Status: DC | PRN
  Administered 2017-12-19: 650 mg via ORAL
  Filled 2017-12-19 (×2): qty 2

## 2017-12-19 MED ORDER — LIDOCAINE HCL 1 % IJ SOLN
INTRAMUSCULAR | Status: AC
  Filled 2017-12-19: qty 40

## 2017-12-19 MED ORDER — ACETAMINOPHEN 325 MG PO TABS
325.0000 mg | ORAL_TABLET | ORAL | Status: DC | PRN
  Administered 2017-12-20: 08:00:00 650 mg via ORAL

## 2017-12-19 MED ORDER — VANCOMYCIN HCL IN DEXTROSE 1-5 GM/200ML-% IV SOLN
1000.0000 mg | INTRAVENOUS | Status: AC
  Administered 2017-12-19: 1000 mg via INTRAVENOUS

## 2017-12-19 MED ORDER — CHLORHEXIDINE GLUCONATE 4 % EX LIQD: 60.0000 mL | Freq: Once | CUTANEOUS | Status: DC

## 2017-12-19 MED ORDER — ESCITALOPRAM OXALATE 20 MG PO TABS
20.0000 mg | ORAL_TABLET | Freq: Every day | ORAL | Status: DC
  Filled 2017-12-19: qty 1

## 2017-12-19 MED ORDER — DOBUTAMINE IN D5W 4-5 MG/ML-% IV SOLN
2.5000 ug/kg/min | INTRAVENOUS | Status: DC
  Administered 2017-12-19: 2.5 ug/kg/min via INTRAVENOUS

## 2017-12-19 MED ORDER — LEVOTHYROXINE SODIUM 50 MCG PO TABS: 50.0000 ug | ORAL_TABLET | Freq: Every day | ORAL | Status: DC

## 2017-12-19 MED ORDER — SODIUM CHLORIDE 0.9 % IV SOLN: 250.0000 mL | INTRAVENOUS | Status: DC

## 2017-12-19 MED ORDER — VANCOMYCIN HCL IN DEXTROSE 1-5 GM/200ML-% IV SOLN
INTRAVENOUS | Status: AC
  Filled 2017-12-19: qty 200

## 2017-12-19 MED ORDER — SODIUM CHLORIDE 0.9 % IR SOLN
80.0000 mg | Status: AC
  Administered 2017-12-19: 80 mg

## 2017-12-19 MED ORDER — ONDANSETRON HCL 4 MG/2ML IJ SOLN: 4.0000 mg | Freq: Four times a day (QID) | INTRAMUSCULAR | Status: DC | PRN

## 2017-12-19 MED ORDER — LIDOCAINE HCL (PF) 1 % IJ SOLN
INTRAMUSCULAR | Status: DC | PRN
  Administered 2017-12-19: 50 mL

## 2017-12-19 MED ORDER — DOBUTAMINE IN D5W 4-5 MG/ML-% IV SOLN
INTRAVENOUS | Status: AC
  Filled 2017-12-19: qty 250

## 2017-12-19 MED ORDER — HEPARIN (PORCINE) IN NACL 2-0.9 UNIT/ML-% IJ SOLN
INTRAMUSCULAR | Status: AC | PRN
  Administered 2017-12-19: 500 mL

## 2017-12-19 SURGICAL SUPPLY — 8 items
CABLE SURGICAL S-101-97-12 (CABLE) ×2 IMPLANT
IPG PACE AZUR XT DR MRI W1DR01 (Pacemaker) ×1 IMPLANT
LEAD CAPSURE NOVUS 5076-52CM (Lead) ×2 IMPLANT
LEAD CAPSURE NOVUS 5076-58CM (Lead) ×2 IMPLANT
PACE AZURE XT DR MRI W1DR01 (Pacemaker) ×2 IMPLANT
PAD DEFIB LIFELINK (PAD) ×2 IMPLANT
SHEATH CLASSIC 7F (SHEATH) ×4 IMPLANT
TRAY PACEMAKER INSERTION (PACKS) ×2 IMPLANT

## 2017-12-19 NOTE — ED Notes (Signed)
Pt awake at this time.  No distress.  Cardiology at bedside.  Orders received.

## 2017-12-19 NOTE — H&P (Signed)
Cardiology Admission History and Physical:   Patient ID: Bill Castro; MRN: [id number]; DOB: 1961/07/08   Admission date: 12/19/2017  Primary Care Provider: Assunta FoundGolding, John, MD Primary Cardiologist: Former pt of Bill Castro, [facility], Bill Castro  Chief Complaint:  Syncope, asystole  Patient Profile:   Bill Castro is a 57 y.o. male with a history of hypothyroidism, DCM, in review of Bill Castro's note, he was able to track down a note about LVEF 30-35% from 2003. Cath at that time without significant CAD, HLD.    History of Present Illness:   Bill Castro [facility] after a syncopal event at home, had warning with brief lightheadedness, [facility] he had 2 witnessed syncopal events associated with asystolic episodes.  In review of Bill Castro's note, once 17 seconds.  He was started on Dobutamine and [facility], and PPM implantation.  Echo was done on patient's arriveal, Bill Castro at bedside estimated LVEF at least 50-60%  He denies any illness of late, no CP, palpitations.  No prior hx of syncope.  LABS: K+ 3.6 BUN/Creat 10/0.90 Mag 2.2 poc Trop 0.01 WBC 9.6 H/H 16/47 Plts 300 TSH 2.883  Home meds reviewed, no nodal blocking/rate limiting agents   Past Medical History:  Diagnosis Date  . Cardiomyopathy   . Depressive disorder   . Hyperlipidemia   . Hypothyroidism     Past Surgical History:  Procedure Laterality Date  . APPENDECTOMY       Medications Prior to Admission: Prior to Admission medications   Medication Sig Start Date End Date Taking? Authorizing Provider  escitalopram (LEXAPRO) 20 MG tablet take 1 tablet once a day. 09/29/17  Yes [provider]  levothyroxine (SYNTHROID, LEVOTHROID) 50 MCG tablet take 1 tablet daily 09/29/17  Yes [provider]     Allergies:    Allergies  Allergen Reactions  . Erythromycin Swelling  . Penicillins     Has patient had a PCN  reaction causing immediate rash, facial/tongue/throat swelling, SOB or lightheadedness with hypotension: Unknown Has patient had a PCN reaction causing severe rash involving mucus membranes or skin necrosis: Unknown Has patient had a PCN reaction that required hospitalization: Unknown Has patient had a PCN reaction occurring within Bill last 10 years: No If all of Bill above answers are "NO", then may proceed with Cephalosporin use.     Social History:   Social History   Socioeconomic History  . Marital status: Single    Spouse name: Not on file  . Number of children: Not on file  . Years of education: Not on file  . Highest education level: Not on file  Social Needs  . Financial resource strain: Not on file  . Food insecurity - worry: Not on file  . Food insecurity - inability: Not on file  . Transportation needs - medical: Not on file  . Transportation needs - non-medical: Not on file  Occupational History  . Not on file  Tobacco Use  . Smoking status: Current Every Day Smoker  . Smokeless tobacco: Never Used  Substance and Sexual Activity  . Alcohol use: No  . Drug use: No  . Sexual activity: Not on file  Other Topics Concern  . Not on file  Social History Narrative  . Not on file    Family History:   Bill patient's family history includes Heart disease in [name].    ROS:  Please see Bill history of present illness.  All other ROS reviewed and negative.     Physical Exam/Data:   Vitals:   12/19/17 1320 12/19/17 1325 12/19/17 1330 12/19/17 1335  BP: (!) 192/93 (!) 169/86 (!) 162/76   Pulse: (!) 104 (!) 109 (!) 103 (!) 102  Resp: 13 20 (!) 27 (!) 24  SpO2: 100% 99% 99% 99%  Weight:      Height:        Intake/Output Summary (Last 24 hours) at 12/19/2017 1343 Last data filed at 12/19/2017 1300 Gross per 24 hour  Intake 104.42 ml  Output -  Net 104.42 ml   Filed Weights   12/19/17 1113  Weight: 200 lb (90.7 kg)   Body mass index is 27.12 kg/m.    General:  Well nourished, well developed, in no acute distress HEENT: normal Lymph: no adenopathy Neck: no JVD Endocrine:  No thryomegaly Vascular: No carotid bruits  Cardiac: RRR; tachycardic, no murmurs, gallops or rubs Lungs:  CTA b/l, no wheezing, rhonchi or rales  Abd: soft, nontender Ext: no edema Musculoskeletal:  No deformities, BUE and BLE strength normal and equal Skin: warm and dry  Neuro:  No focal abnormalities noted Psych:  Normal affect    EKG:  Bill ECG that was done was personally reviewed and demonstrates  SR 105bpm, PR , QRS 96ms, QTc Telemetry: currently SR/ST 100-105bpm  Relevant CV Studies:  Echo done, pending read  Laboratory Data:  Chemistry Recent Labs  Lab 12/19/17 1115 12/19/17 1131  NA 139 142  K 3.5 3.6  CL 105 106  CO2 23  --   GLUCOSE 120* 115*  BUN 12 10  CREATININE 0.88 0.90  CALCIUM 8.9  --   GFRNONAA >60  --   GFRAA >60  --   ANIONGAP 11  --     Recent Labs  Lab 12/19/17 1115  PROT 7.3  ALBUMIN 4.2  AST 20  ALT 21  ALKPHOS 77  BILITOT 0.2*   Hematology Recent Labs  Lab 12/19/17 1115 12/19/17 1131  WBC 9.6  --   RBC 4.79  --   HGB 15.2 16.0  HCT 45.5 47.0  MCV 95.0  --   MCH 31.7  --   MCHC 33.4  --   RDW 12.9  --   PLT 300  --    Cardiac EnzymesNo results for input(s): TROPONINI in Bill last 168 hours.  Recent Labs  Lab 12/19/17 1129  TROPIPOC 0.01    BNPNo results for input(s): BNP, PROBNP in Bill last 168 hours.  DDimer No results for input(s): DDIMER in Bill last 168 hours.  Radiology/Studies:  Dg Chest Port 1 View Result Date: 12/19/2017 CLINICAL DATA:  57 year old male with intermittent right face numbness today followed by syncopal episode, woke up on floor. EXAM: PORTABLE CHEST 1 VIEW COMPARISON:  08/15/2012 and earlier. FINDINGS: Portable AP upright view at 1138 hrs. Pacer or resuscitation pads project over Bill left chest. Normal cardiac size and mediastinal contours. Visualized tracheal  air column is within normal limits. Allowing for portable technique Bill lungs are clear. No acute osseous abnormality identified. IMPRESSION: Negative.  No acute cardiopulmonary abnormality. Electronically Signed   By: Odessa Fleming M.D.   On: 12/19/2017 11:59    Ct Head Code Stroke Wo Contrast Result Date: 12/19/2017 CLINICAL DATA:  Code stroke.  Right facial droop and numbness. EXAM: CT HEAD WITHOUT CONTRAST TECHNIQUE: Contiguous axial images were obtained from Bill base of Bill skull through  Bill vertex without intravenous contrast. COMPARISON:  None. FINDINGS: Brain: There is no evidence of acute infarct, intracranial hemorrhage, mass, midline shift, or extra-axial fluid collection. Bill ventricles and sulci are normal for age. Vascular: Mild calcified atherosclerosis in Bill carotid siphons. No hyperdense vessel. Skull: No fracture or focal osseous lesion. Sinuses/Orbits: Mild left frontal and ethmoid sinus mucosal thickening. Clear mastoid air cells. Unremarkable orbits. Other: None. ASPECTS [facility] Stroke Program Early CT Score) - Ganglionic level infarction (caudate, lentiform nuclei, internal capsule, insula, M1-M3 cortex): 7 - Supraganglionic infarction (M4-M6 cortex): 3 Total score (0-10 with 10 being normal): 10 IMPRESSION: 1. No evidence of acute intracranial abnormality. Unremarkable CT appearance of Bill brain. 2. ASPECTS is 10. These results were called by telephone at Bill time of interpretation on 12/19/2017 at 11:34 am to Dr. Samuel Jester , who verbally acknowledged these results. Electronically Signed   By: Sebastian Ache M.D.   On: 12/19/2017 11:34    Assessment and Plan:   1. Recurrent syncope 2. Asystole observed at [facility]  No reversible causes for sinus arrest/asystolic events, EKG is non-ischemic looking, no anginal complaints, TSH is wnl.    Dr. Ladona Ridgel has seen and examined Bill patient, discussed PPM implant procedure, risks and benefits, Bill patient is agreeable to proceed.     For  questions or updates, please contact [facility] Please consult [email or web address] for contact info under Cardiology/STEMI.    Signed, Sheilah Pigeon, PA-C  12/19/2017 1:43 PM   EP attending  Patient seen and examined.  Agree with Bill findings as noted above.  I have reviewed Bill documentation above and agree.  Bill patient has had recurrent syncope secondary to prolonged periods of asystole.  There has been transient complete heart block.  Bill patient's symptoms are not consistent with autonomic dysfunction although this still represents Bill most likely diagnosis.  There is no reversible features with regard to his treatment and for this reason I recommended insertion of a permanent dual-chamber pacemaker in hopes of preventing additional malignant neurally mediated syncope.  His EKG demonstrates sinus rhythm with no conduction abnormalities.  Review of his electrogram is demonstrates prolonged pauses of over 10 seconds.  I have discussed Bill treatment options with Bill patient.  Bill risks, goals, benefits, and expectations of pacemaker insertion were reviewed.  He wishes to proceed.  Of note, I have discussed Bill mechanism and physiology of neurally mediated syncope.  Despite successful pacemaker insertion, there is certainly a potential for recurrent syncope if his vaso-depression is not controlled.  Sharrell Ku, MD

## 2017-12-19 NOTE — Progress Notes (Signed)
CODE STROKE 1105 CALL 1110 EXAM STARTED 1112 EXAM FINISHED 1112 IMAGES SENT TO PACS 1118 EXAM COMPLETED IN EPIC 1120 [facility] CALLED

## 2017-12-19 NOTE — ED Notes (Signed)
Pt episode while getting off CT table where he felt like he was having another episode (like he had PTA).  Pt on cardiac monitor and went into asystole.  Pt unresponsive.  Code blue called and preparing to start compressions.  Pt became responsive while starting compressions.

## 2017-12-19 NOTE — ED Notes (Signed)
Pt awake at this time.  No distress.  Sinus tach on monitor  105 .

## 2017-12-19 NOTE — ED Notes (Signed)
Patient to CT.

## 2017-12-19 NOTE — ED Triage Notes (Addendum)
Patient reports of waking up at 1000 this morning and felt normal. States he went to get a cup of coffee at 1015 and felt numbness to right side of face.  States numbness went way  And came back a short time later, yet numbness is present again. Reports of going into garage and passed out and woke up in floor. Abrasion noted to right forearm. Patient has swelling/possible mild droop noted to right side of face.    EDP at bedside.

## 2017-12-19 NOTE — Plan of Care (Signed)
  Progressing Activity: Risk for activity intolerance will decrease 12/19/2017 2253 - Progressing by Leata MouseAninon, Sharla Tankard S, RN Pain Managment: General experience of comfort will improve 12/19/2017 2253 - Progressing by Leata MouseAninon, Evyn Putzier S, RN Safety: Ability to remain free from injury will improve 12/19/2017 2253 - Progressing by Leata MouseAninon, Kerby Borner S, RN Education: Knowledge of cardiac device and self-care will improve 12/19/2017 2253 - Progressing by Leata MouseAninon, Marie Chow S, RN Ability to safely manage health related needs after discharge will improve 12/19/2017 2253 - Progressing by Leata MouseAninon, Hicks Feick S, RN Cardiac: Ability to achieve and maintain adequate cardiopulmonary perfusion will improve 12/19/2017 2253 - Progressing by Leata MouseAninon, Khamauri Bauernfeind S, RN

## 2017-12-19 NOTE — Progress Notes (Signed)
  Echocardiogram 2D Echocardiogram has been performed.  Leta JunglingCooper, Rosemary Pentecost M 12/19/2017, 1:24 PM

## 2017-12-19 NOTE — Consult Note (Signed)
[facility] TeleNeurology Consult Services  Asked to see this patient in telemedicine consultation. Consultation was performed with assistance of ancillary/medical staff at bedside.  Comments: Last Known normal last night Door Time: 1059 [facility] Contacted: 1124 [facility] first log in: 1134 NIHSS assessment time: 1140 Needle Time: no iv tpa Call back time: 1140  HPI:  [id number] yom presents to hospital with concern for stroke sxs. He went to bed feeling fine but woke up this am at 915 with R facial numbness. He state he walked to the garage with recurrent sxs and woke up on the ground. He denies hx of seizures or stroke and in ED there was concern for few second of pause on telemetry per d/w nursing. CT scan head per radiology report is negative for acute process.  VSS  Gen Wn/Wd in Nad  TeleStroke Assessment: LOC:   0 LOC questions:  0 LOC Commands :   0 Gaze : 0 Visual fields :  0  Facial movements : 0 Upper limb Motor  0 Lower limb Motor  0 Limb Coordination  - 0 Sensory -  - 1 Language -  0 Speech -   0 Neglect / extinction -  0  NIHSS Score: 1    IMPRESSION  TIA/VBI Syncope concern for cardiac etiology  Differential Diagnosis: 1- Cardioembolic stroke 2- Small vessel disease 3- Thrombotic artery to artery mechanism 4- Hypercoagulable state related 5- Thrombotic large vessel disease  Blood Pressure and Blood Glucose within acceptable parameters.   Medical Decision Making:   Patient is not candidate for alteplase due to onset greater than 4.5hrs  Not an IR candidate as low clinical suspicion for LVO by neurologic assessment.   Recommendations: - Daily antithrombotics to initiate now if no contraindication.  - Further work up with Stroke labs, MRI brain, ECHO, NIVS Carotid will be deferred to inpt neurology service -  Needs Inpatient Neurology consultation and follow up  - Thank you for allowing us to participate in the care of your  patient, if there are any questions please don't hesitate to contact us  Discussed plan of care with patient/family/hospital staff   Physician: Terrace ArabiaMohammed Christyn Gutkowski, DO   [facility]

## 2017-12-19 NOTE — Progress Notes (Signed)
Full consult to follow. Contacted about multiple episodes of asystole in ER. Admitted with syncope at home. Documented episodes of asystole in ER, self recovered. Home meds are only lexapro and synthroid. Pads in place, starting dobutamine. Will need transfer to [facility] for temp wire placement, my PA is working to help make arrangements.   Dina RichJonathan Teaghan Melrose MD

## 2017-12-19 NOTE — ED Notes (Signed)
Back in ER with  doctor at bedside and pt had another episode where he went into asystole and was unresponsive.

## 2017-12-19 NOTE — ED Provider Notes (Signed)
[facility] EMERGENCY DEPARTMENT Provider Note   CSN: [id number] Arrival date & time: 12/19/17  1059   An emergency department physician performed an initial assessment on this suspected stroke patient at 1111.  History   Chief Complaint Chief Complaint  Patient presents with  . Code Stroke    HPI MATHAN DARROCH is a 57 y.o. male.  The history is provided by the patient. The history is limited by the condition of the patient (Urgent need for intervention).    Pt was seen at 1110.  Per pt, c/o sudden onset and resolution of 1 episodes of near syncope and 1 episode of syncope that occurred PTA. Pt states he woke up at 10am and "felt fine." Pt states at 1015am he felt his right lower face "go numb" and he felt lightheaded. Pt states this lasted approximately 5 minutes before resolving. This occurred again a short time later. Pt states the 3rd time this happened he "woke up on the floor in the garage."  Pt states he felt similar symptoms x1 a few months ago, but did not seek medical attention. Pt continues to have right sided facial "numbness." He states he currently does not feel lightheaded. Denies CP/palpitations, no SOB/cough, no abd pain, no N/V/D, no back pain, no neck pain, no headache, no slurred speech, no visual changes, no focal motor weakness, no tingling/numbness in extremities.   Past Medical History:  Diagnosis Date  . Cardiomyopathy   . Depressive disorder   . Hyperlipidemia   . Hypothyroidism     Patient Active Problem List   Diagnosis Date Noted  . Asystole (HCC) 12/19/2017    Past Surgical History:  Procedure Laterality Date  . APPENDECTOMY         Home Medications    Prior to Admission medications   Medication Sig Start Date End Date Taking? Authorizing Provider  escitalopram (LEXAPRO) 20 MG tablet take 1 tablet once a day. 09/29/17  Yes [provider]  levothyroxine (SYNTHROID, LEVOTHROID) 50 MCG tablet take 1 tablet daily 09/29/17  Yes  [provider]    Family History Family History  Problem Relation Age of Onset  . Heart disease Father     Social History Social History   Tobacco Use  . Smoking status: Current Every Day Smoker  . Smokeless tobacco: Never Used  Substance Use Topics  . Alcohol use: No  . Drug use: No     Allergies   Erythromycin and Penicillins   Review of Systems Review of Systems  Unable to perform ROS: Acuity of condition     Physical Exam Updated Vital Signs BP (!) 148/94   Pulse (!) 107   Resp (!) 29   Ht 6' (1.829 m)   Wt 90.7 kg (200 lb)   SpO2 100%   BMI 27.12 kg/m    Patient Vitals for the past 24 hrs:  BP Pulse Resp SpO2 Height Weight  12/19/17 1200 (!) 148/94 (!) 107 (!) 29 100 % - -  12/19/17 1145 (!) 153/90 (!) 101 (!) 27 100 % - -  12/19/17 1130 (!) 165/91 (!) 105 (!) 23 100 % - -  12/19/17 1129 - (!) 107 (!) 28 100 % - -  12/19/17 1128 138/89 (!) 108 (!) 27 100 % - -  12/19/17 1121 (!) 154/100 96 (!) 34 100 % - -  12/19/17 1115 (!) 121/91 (!) 108 (!) 24 100 % - -  12/19/17 1113 - - - - 6' (1.829 m) 90.7  kg (200 lb)  12/19/17 1110 138/84 (!) 105 18 100 % - -     Physical Exam 1110: Physical examination:  Nursing notes reviewed; Vital signs and O2 SAT reviewed;  Constitutional: Well developed, Well nourished, Well hydrated, In no acute distress; Head:  Normocephalic, atraumatic; Eyes: EOMI, PERRL, No scleral icterus; ENMT: Mouth and pharynx normal, Mucous membranes moist; Neck: Supple, Full range of motion, No lymphadenopathy; Cardiovascular: Tachycardic rate and rhythm, No gallop; Respiratory: Breath sounds clear & equal bilaterally, No wheezes.  Speaking full sentences with ease, Normal respiratory effort/excursion; Chest: Nontender, Movement normal; Abdomen: Soft, Nontender, Nondistended, Normal bowel sounds; Genitourinary: No CVA tenderness; Extremities: Pulses normal, +superficial abrasion right forearm. No tenderness, No edema, No calf edema or  asymmetry.; Neuro: AA&Ox3, Major CN grossly intact. Speech clear.  +right lower facial droop.  No nystagmus. Grips equal. Strength 5/5 equal bilat UE's and LE's.  DTR 2/4 equal bilat UE's and LE's.  +mild subjective decreased sensation right face, otherwise no gross sensory deficits.  Normal cerebellar testing bilat UE's (finger-nose) and LE's (heel-shin)..; Skin: Color normal, Warm, Dry.   ED Treatments / Results  Labs (all labs ordered are listed, but only abnormal results are displayed)   EKG  EKG Interpretation  Date/Time:  Wednesday December 19 2017 11:10:24 EST Ventricular Rate:  105 PR Interval:    QRS Duration: 96 QT Interval:  342 QTC Calculation: 452 R Axis:   19 Text Interpretation:  Sinus tachycardia Borderline prolonged PR interval Abnormal R-wave progression, early transition Artifact When compared with ECG of 09/15/2010 No significant change was found Confirmed by Samuel Jester [phone]) on 12/19/2017 11:20:02 AM       Radiology   Procedures Procedures (including critical care time)  Medications Ordered in ED Medications  potassium chloride 10 mEq in 100 mL IVPB (10 mEq Intravenous New Bag/Given 12/19/17 1150)  0.9 %  sodium chloride infusion (not administered)  DOBUTamine (DOBUTREX) infusion 4000 mcg/mL (2.5 mcg/kg/min  90.7 kg Intravenous New Bag/Given 12/19/17 1142)  aspirin EC tablet 81 mg (not administered)  acetaminophen (TYLENOL) tablet 650 mg (not administered)  sodium chloride flush (NS) 0.9 % injection 3 mL (not administered)  sodium chloride flush (NS) 0.9 % injection 3 mL (not administered)  0.9 %  sodium chloride infusion (not administered)  escitalopram (LEXAPRO) tablet 20 mg (not administered)  levothyroxine (SYNTHROID, LEVOTHROID) tablet 50 mcg (not administered)     Initial Impression / Assessment and Plan / ED Course  I have reviewed the triage vital signs and the nursing notes.  Pertinent labs & imaging results that were available during  my care of the patient were reviewed by me and considered in my medical decision making (see chart for details).  MDM Reviewed: previous chart, nursing note and vitals Reviewed previous: labs and ECG Interpretation: labs, ECG, x-ray and CT scan Total time providing critical care: 30-74 minutes. This excludes time spent performing separately reportable procedures and services. Consults: cardiology and neurology   CRITICAL CARE Performed by: Laray Anger Total critical care time: 40 minutes Critical care time was exclusive of separately billable procedures and treating other patients. Critical care was necessary to treat or prevent imminent or life-threatening deterioration. Critical care was time spent personally by me on the following activities: development of treatment plan with patient and/or surrogate as well as nursing, discussions with consultants, evaluation of patient's response to treatment, examination of patient, obtaining history from patient or surrogate, ordering and performing treatments and interventions, ordering and review of laboratory studies,  ordering and review of radiographic studies, pulse oximetry and re-evaluation of patient's condition.   Results for orders placed or performed during the hospital encounter of 12/19/17  Ethanol  Result Value Ref Range   Alcohol, Ethyl (B) <10 <10 mg/dL  Protime-INR  Result Value Ref Range   Prothrombin Time 11.9 11.4 - 15.2 seconds   INR 0.88   APTT  Result Value Ref Range   aPTT 26 24 - 36 seconds  CBC  Result Value Ref Range   WBC 9.6 4.0 - 10.5 K/uL   RBC 4.79 4.22 - 5.81 MIL/uL   Hemoglobin 15.2 13.0 - 17.0 g/dL   HCT 60.4 54.0 - 98.1 %   MCV 95.0 78.0 - 100.0 fL   MCH 31.7 26.0 - 34.0 pg   MCHC 33.4 30.0 - 36.0 g/dL   RDW 19.1 47.8 - 29.5 %   Platelets 300 150 - 400 K/uL  Differential  Result Value Ref Range   Neutrophils Relative % 53 %   Neutro Abs 5.1 1.7 - 7.7 K/uL   Lymphocytes Relative 34 %    Lymphs Abs 3.3 0.7 - 4.0 K/uL   Monocytes Relative 8 %   Monocytes Absolute 0.8 0.1 - 1.0 K/uL   Eosinophils Relative 4 %   Eosinophils Absolute 0.4 0.0 - 0.7 K/uL   Basophils Relative 1 %   Basophils Absolute 0.1 0.0 - 0.1 K/uL  Comprehensive metabolic panel  Result Value Ref Range   Sodium 139 135 - 145 mmol/L   Potassium 3.5 3.5 - 5.1 mmol/L   Chloride 105 101 - 111 mmol/L   CO2 23 22 - 32 mmol/L   Glucose, Bld 120 (H) 65 - 99 mg/dL   BUN 12 6 - 20 mg/dL   Creatinine, Ser 6.21 0.61 - 1.24 mg/dL   Calcium 8.9 8.9 - 30.8 mg/dL   Total Protein 7.3 6.5 - 8.1 g/dL   Albumin 4.2 3.5 - 5.0 g/dL   AST 20 15 - 41 U/L   ALT 21 17 - 63 U/L   Alkaline Phosphatase 77 38 - 126 U/L   Total Bilirubin 0.2 (L) 0.3 - 1.2 mg/dL   GFR calc non Af Amer >60 >60 mL/min   GFR calc Af Amer >60 >60 mL/min   Anion gap 11 5 - 15  Magnesium  Result Value Ref Range   Magnesium 2.2 1.7 - 2.4 mg/dL  I-Stat Chem 8, ED  (not at [facility], [facility])  Result Value Ref Range   Sodium 142 135 - 145 mmol/L   Potassium 3.6 3.5 - 5.1 mmol/L   Chloride 106 101 - 111 mmol/L   BUN 10 6 - 20 mg/dL   Creatinine, Ser 6.57 0.61 - 1.24 mg/dL   Glucose, Bld 846 (H) 65 - 99 mg/dL   Calcium, Ion 9.62 (L) 1.15 - 1.40 mmol/L   TCO2 23 22 - 32 mmol/L   Hemoglobin 16.0 13.0 - 17.0 g/dL   HCT 95.2 84.1 - 32.4 %  I-stat troponin, ED (not at [facility], [facility])  Result Value Ref Range   Troponin i, poc 0.01 0.00 - 0.08 ng/mL   Comment 3           Dg Chest Port 1 View Result Date: 12/19/2017 CLINICAL DATA:  57 year old male with intermittent right face numbness today followed by syncopal episode, woke up on floor. EXAM: PORTABLE CHEST 1 VIEW COMPARISON:  08/15/2012 and earlier. FINDINGS: Portable AP upright view at 1138 hrs. Pacer or resuscitation pads project over the  left chest. Normal cardiac size and mediastinal contours. Visualized tracheal air column is within normal limits. Allowing for portable technique the lungs are clear. No acute  osseous abnormality identified. IMPRESSION: Negative.  No acute cardiopulmonary abnormality. Electronically Signed   By: Odessa FlemingH  Hall M.D.   On: 12/19/2017 11:59   Ct Head Code Stroke Wo Contrast Result Date: 12/19/2017 CLINICAL DATA:  Code stroke.  Right facial droop and numbness. EXAM: CT HEAD WITHOUT CONTRAST TECHNIQUE: Contiguous axial images were obtained from the base of the skull through the vertex without intravenous contrast. COMPARISON:  None. FINDINGS: Brain: There is no evidence of acute infarct, intracranial hemorrhage, mass, midline shift, or extra-axial fluid collection. The ventricles and sulci are normal for age. Vascular: Mild calcified atherosclerosis in the carotid siphons. No hyperdense vessel. Skull: No fracture or focal osseous lesion. Sinuses/Orbits: Mild left frontal and ethmoid sinus mucosal thickening. Clear mastoid air cells. Unremarkable orbits. Other: None. ASPECTS [facility]([place] Stroke Program Early CT Score) - Ganglionic level infarction (caudate, lentiform nuclei, internal capsule, insula, M1-M3 cortex): 7 - Supraganglionic infarction (M4-M6 cortex): 3 Total score (0-10 with 10 being normal): 10 IMPRESSION: 1. No evidence of acute intracranial abnormality. Unremarkable CT appearance of the brain. 2. ASPECTS is 10. These results were called by telephone at the time of interpretation on 12/19/2017 at 11:34 am to Dr. Samuel JesterKATHLEEN Annelisa Ryback , who verbally acknowledged these results. Electronically Signed   By: Sebastian AcheAllen  Grady M.D.   On: 12/19/2017 11:34    1135:  Code Stroke called on pt's arrival. While in CT scan, pt stated he "felt like he was having another episode." Pt became unresponsive. Cardiac monitor changed from NSR to asystole for approximately 15-30 sec, then before CPR could be started, pt's monitor changed to NSR and pt became awake/alert. BP and O2 Sats stable. T/C to Cards Dr. Wyline MoodBranch, case discussed, including:  HPI, pertinent PM/SHx, VS/PE, dx testing, ED course and treatment:   Agreeable to consult.   1140:  TeleNeuro MD has evaluated pt: recommends CT-A. Pt will need to have this completed after asystole is addressed.   1145:  Pt had another episode of asystole while in the ED. Monitor changes from NSR rate 70-100's to asystole, back to NSR with rate 70-100's. BP stable. Sats 100% R/A. Cards at bedside for consult; [facility].      Final Clinical Impressions(s) / ED Diagnoses   Final diagnoses:  Paresthesias  Asystole [facility](HCC)    ED Discharge Orders    None        Samuel JesterMcManus, Makynlie Rossini, DO 12/22/17 [phone]

## 2017-12-19 NOTE — Consult Note (Signed)
Cardiology Consultation:   Patient ID: Trinna PostMark L Vanhandel; [id number]; 1961-02-02   Admit date: 12/19/2017 Date of Consult: 12/19/2017  Primary Care Provider: Assunta FoundGolding, John, MD Primary Cardiologist: Former Alanda AmassWeintraub Primary Electrophysiologist:  n/a   Patient Profile:   Trinna PostMark L Lessner is a 57 y.o. male with a hx of hypothyroidism who is being seen today for the evaluation of syncope at the request of Dr Clarene DukeMcManus.  History of Present Illness:   Mr. Julian HyChildrey 57 yo male history of depression, HL, hypothyroidism, cardiomyopathy previously followed by Dr Alanda AmassWeintraub little is known about this admitted with episodes of AMS and syncope. In ER patient had 2-3 episodes that all corresponded with asystole with spontaneous recovery of rhtyyhm. TElemetry reviewed, SR with sudden sinus arrest, specific strip I reviewed for 16 seconds. He is not on any av nodal agents at home, reports compliance with synthroid.   Past Medical History:  Diagnosis Date  . Cardiomyopathy   . Depressive disorder   . Hyperlipidemia   . Hypothyroidism     Past Surgical History:  Procedure Laterality Date  . APPENDECTOMY        Inpatient Medications: Scheduled Meds:  Continuous Infusions: . sodium chloride    . DOBUTamine    . DOBUTamine 2.5 mcg/kg/min (12/19/17 1142)  . potassium chloride     PRN Meds:   Allergies:    Allergies  Allergen Reactions  . Erythromycin Swelling  . Penicillins     Has patient had a PCN reaction causing immediate rash, facial/tongue/throat swelling, SOB or lightheadedness with hypotension: Unknown Has patient had a PCN reaction causing severe rash involving mucus membranes or skin necrosis: Unknown Has patient had a PCN reaction that required hospitalization: Unknown Has patient had a PCN reaction occurring within the last 10 years: No If all of the above answers are "NO", then may proceed with Cephalosporin use.     Social History:   Social History   Socioeconomic History   . Marital status: Single    Spouse name: Not on file  . Number of children: Not on file  . Years of education: Not on file  . Highest education level: Not on file  Social Needs  . Financial resource strain: Not on file  . Food insecurity - worry: Not on file  . Food insecurity - inability: Not on file  . Transportation needs - medical: Not on file  . Transportation needs - non-medical: Not on file  Occupational History  . Not on file  Tobacco Use  . Smoking status: Current Every Day Smoker  . Smokeless tobacco: Never Used  Substance and Sexual Activity  . Alcohol use: No  . Drug use: No  . Sexual activity: Not on file  Other Topics Concern  . Not on file  Social History Narrative  . Not on file    Family History:    Family History  Problem Relation Age of Onset  . Heart disease Father      ROS:  Per HPI  Physical Exam/Data:   Vitals:   12/19/17 1113  Weight: 200 lb (90.7 kg)  Height: 6' (1.829 m)   No intake or output data in the 24 hours ending 12/19/17 1149 Filed Weights   12/19/17 1113  Weight: 200 lb (90.7 kg)   Body mass index is 27.12 kg/m.  General:  Well nourished, well developed, in no acute distress HEENT: normal Lymph: no adenopathy Neck: no JVD Endocrine:  No thryomegaly Cardiac:  2/6 systolic murmur rusb Lungs:  clear  to auscultation bilaterally, no wheezing, rhonchi or rales  Abd: soft, nontender, no hepatomegaly  Ext: no edema Musculoskeletal:  No deformities, BUE and BLE strength normal and equal Skin: warm and dry  Neuro:  CNs 2-12 intact, no focal abnormalities noted Psych:  Normal affect     Laboratory Data:  Chemistry Recent Labs  Lab 12/19/17 1131  NA 142  K 3.6  CL 106  GLUCOSE 115*  BUN 10  CREATININE 0.90    No results for input(s): PROT, ALBUMIN, AST, ALT, ALKPHOS, BILITOT in the last 168 hours. Hematology Recent Labs  Lab 12/19/17 1115 12/19/17 1131  WBC 9.6  --   RBC 4.79  --   HGB 15.2 16.0  HCT 45.5  47.0  MCV 95.0  --   MCH 31.7  --   MCHC 33.4  --   RDW 12.9  --   PLT 300  --    Cardiac EnzymesNo results for input(s): TROPONINI in the last 168 hours.  Recent Labs  Lab 12/19/17 1129  TROPIPOC 0.01    BNPNo results for input(s): BNP, PROBNP in the last 168 hours.  DDimer No results for input(s): DDIMER in the last 168 hours.  Radiology/Studies:  Ct Head Code Stroke Wo Contrast  Result Date: 12/19/2017 CLINICAL DATA:  Code stroke.  Right facial droop and numbness. EXAM: CT HEAD WITHOUT CONTRAST TECHNIQUE: Contiguous axial images were obtained from the base of the skull through the vertex without intravenous contrast. COMPARISON:  None. FINDINGS: Brain: There is no evidence of acute infarct, intracranial hemorrhage, mass, midline shift, or extra-axial fluid collection. The ventricles and sulci are normal for age. Vascular: Mild calcified atherosclerosis in the carotid siphons. No hyperdense vessel. Skull: No fracture or focal osseous lesion. Sinuses/Orbits: Mild left frontal and ethmoid sinus mucosal thickening. Clear mastoid air cells. Unremarkable orbits. Other: None. ASPECTS [facility] Stroke Program Early CT Score) - Ganglionic level infarction (caudate, lentiform nuclei, internal capsule, insula, M1-M3 cortex): 7 - Supraganglionic infarction (M4-M6 cortex): 3 Total score (0-10 with 10 being normal): 10 IMPRESSION: 1. No evidence of acute intracranial abnormality. Unremarkable CT appearance of the brain. 2. ASPECTS is 10. These results were called by telephone at the time of interpretation on 12/19/2017 at 11:34 am to Dr. Samuel Jester , who verbally acknowledged these results. Electronically Signed   By: Sebastian Ache M.D.   On: 12/19/2017 11:34   2013 cath The patient was taken to the cardiac cath lab on Mar 27, 2002 by Dr. Lenise Herald.  This revealed a normal left main and normal LAD. Diagonal one had a 30% ostial lesion.  The circumflex was widely patent. There was a 20%  ostial/proximal OM lesion.  The RCA was free of disease except at its distal-most portion.  Two small branches had 90% lesions.  EF, however, was low at 30-35% with global hypokinesis Assessment and Plan:   1. Sinus arrest/asystole with syncope - documented episodes in the ER of asytole and syncope, the strip I have is up to 16 seconds. He is not on any av nodal agents. Thyroid tests pending - started on dobutamine 2.5, external pads placed in case pacing required - urgent transfer to [facility] for EP evaluation, and permanent vs temp pacer   2. Cardiomyopathy - per patient report, I don't know details. Previously seen by Dr Alanda Amass.  - there is a note about LVEF 30-35% from 2003. Cath at that time without significant CAD.  -I do not seen an echo in our  system, patient reports he has not followed up in some time.  - will need echo this admit   For questions or updates, please contact [facility] Please consult [email or web address] for contact info under Cardiology/STEMI.   Joanie Coddington, MD  12/19/2017 11:49 AM

## 2017-12-19 NOTE — Discharge Summary (Signed)
DISCHARGE SUMMARY    Patient ID: Bill Castro,  MRN: [id number], DOB/AGE: [id number] y.o.  Admit date: 12/19/2017 Discharge date: 12/20/17  Primary Care Physician: Assunta FoundGolding, John, MD  [facility]: [facility], Dr. Wyline MoodBranch Electrophysiologist: [facility], Dr. Ladona Ridgelaylor  Primary Discharge Diagnosis:  1. Syncope 2. Asystole  Secondary Discharge Diagnosis:  1. Hypothyroidism  Allergies  Allergen Reactions  . Erythromycin Swelling  . Penicillins     Has patient had a PCN reaction causing immediate rash, facial/tongue/throat swelling, SOB or lightheadedness with hypotension: Unknown Has patient had a PCN reaction causing severe rash involving mucus membranes or skin necrosis: Unknown Has patient had a PCN reaction that required hospitalization: Unknown Has patient had a PCN reaction occurring within the last 10 years: No If all of the above answers are "NO", then may proceed with Cephalosporin use.      Procedures This Admission:  1.  Implantation of a MDT dual chamber PPM on 12/19/17 by Dr Ladona Ridgelaylor.  The patient received a Medtronic (serial number [id number] right atrial lead and a medtronic (serial number [id number]) right ventricular lead medtronic (serial number [id number]) pacemaker There were no immediate post procedure complications. 2.  CXR on 12/20/17 demonstrated no pneumothorax status post device implantation.   Brief HPI: Bill Castro is a 57 y.o. male with a history of hypothyroidism, DCM, in review of Dr. Verna CzechBranch's note, he was able to track down a note about LVEF 30-35% from 2003. Cath at that time without significant CAD, HLD, [facility] a [facility]opal event while working in his garage, was seated at [employer] work[employer], while in the ED he had 2 recurrent syncopal events associated with asystolic episodes, started on Dobutamine and [facility]   Hospital Course:  The patient was admitted and without finding reversible  causes, suspected to be severe autonomic dysfunction, echo done noted LVEF 65-70%, and underwent implantation of a PPM with details as outlined above.  His dobutamine stopped post pacemaker, he was monitored on telemetry overnight which demonstrated SR.  Left chest was without hematoma or ecchymosis.  The device was interrogated and found to be functioning normally.  CXR was obtained and demonstrated no pneumothorax status post device implantation.  Wound care, arm mobility, and restrictions were reviewed with the patient.  The patient was examined by Dr. Ladona Ridgelaylor and considered stable for discharge to home.     Physical Exam: Vitals:   12/20/17 0200 12/20/17 0534 12/20/17 0600 12/20/17 0800  BP: 118/77 137/86 139/81 (!) 146/89  Pulse: 80 70 72 77  Resp: 17 16 17 14   Temp:  98.5 F (36.9 C)  98.4 F (36.9 C)  TempSrc:  Oral  Oral  SpO2: 98% 98% 96% 97%  Weight:  260 lb 9.3 oz (118.2 kg)    Height:        GEN- The patient is well appearing, alert and oriented x 3 today.   HEENT: normocephalic, atraumatic; sclera clear, conjunctiva pink; hearing intact; oropharynx clear; neck supple, no JVP Lungs- CTA b/l, normal work of breathing.  No wheezes, rales, rhonchi Heart- RRR, no murmurs, rubs or gallops, PMI not laterally displaced GI- soft, non-tender, non-distended Extremities- no clubbing, cyanosis, or edema MS- no significant deformity or atrophy Skin- warm and dry, no rash or lesion, left chest without hematoma/ecchymosis Psych- euthymic mood, full affect Neuro- no gross deficits   Labs:   Lab Results  Component Value Date   WBC 9.9 12/20/2017  HGB 14.5 12/20/2017   HCT 42.4 12/20/2017   MCV 95.3 12/20/2017   PLT 261 12/20/2017    Recent Labs  Lab 12/19/17 1115  12/20/17 0250  NA 139   < > 141  K 3.5   < > 4.0  CL 105   < > 110  CO2 23  --  22  BUN 12   < > 8  CREATININE 0.88   < > 0.91  CALCIUM 8.9  --  8.9  PROT 7.3  --   --   BILITOT 0.2*  --   --   ALKPHOS 77   --   --   ALT 21  --   --   AST 20  --   --   GLUCOSE 120*   < > 101*   < > = values in this interval not displayed.    Discharge Medications:  Allergies as of 12/20/2017      Reactions   Erythromycin Swelling   Penicillins    Has patient had a PCN reaction causing immediate rash, facial/tongue/throat swelling, SOB or lightheadedness with hypotension: Unknown Has patient had a PCN reaction causing severe rash involving mucus membranes or skin necrosis: Unknown Has patient had a PCN reaction that required hospitalization: Unknown Has patient had a PCN reaction occurring within the last 10 years: No If all of the above answers are "NO", then may proceed with Cephalosporin use.      Medication List    TAKE these medications   escitalopram 20 MG tablet Commonly known as:  LEXAPRO take 1 tablet once a day.   levothyroxine 50 MCG tablet Commonly known as:  SYNTHROID, LEVOTHROID take 1 tablet daily       Disposition:  Home  Discharge Instructions    Diet - low sodium heart healthy   Complete by:  As directed    Increase activity slowly   Complete by:  As directed      Follow-up Information    [facility] Follow up on 01/02/2018.   Specialty:  Cardiology Why:  9:00AM, wound check visit Contact information: [address], [address] [place] [address] [phone]       Marinus Maw, MD Follow up on 03/21/2018.   Specialty:  Cardiology Why:  10:15AM Contact information: [address] [place] [address] [phone]           Duration of Discharge Encounter: Greater than 30 minutes including physician time.  Norma Fredrickson, PA-C 12/20/2017 9:02 AM  EP Attending  Patient seen and examined. Agree with above. His PPM has been interogated under my direction and is working normally. Ok for DC home with usual followup.  Leonia Reeves.D.

## 2017-12-20 ENCOUNTER — Inpatient Hospital Stay (HOSPITAL_COMMUNITY): Payer: Commercial Managed Care - PPO

## 2017-12-20 ENCOUNTER — Encounter (HOSPITAL_COMMUNITY): Payer: Self-pay | Admitting: Internal Medicine

## 2017-12-20 DIAGNOSIS — I442 Atrioventricular block, complete: Secondary | ICD-10-CM | POA: Diagnosis not present

## 2017-12-20 LAB — CBC
HCT: 42.4 % (ref 39.0–52.0)
Hemoglobin: 14.5 g/dL (ref 13.0–17.0)
MCH: 32.6 pg (ref 26.0–34.0)
MCHC: 34.2 g/dL (ref 30.0–36.0)
MCV: 95.3 fL (ref 78.0–100.0)
Platelets: 261 10*3/uL (ref 150–400)
RBC: 4.45 MIL/uL (ref 4.22–5.81)
RDW: 13.2 % (ref 11.5–15.5)
WBC: 9.9 10*3/uL (ref 4.0–10.5)

## 2017-12-20 LAB — BASIC METABOLIC PANEL
Anion gap: 9 (ref 5–15)
BUN: 8 mg/dL (ref 6–20)
CO2: 22 mmol/L (ref 22–32)
Calcium: 8.9 mg/dL (ref 8.9–10.3)
Chloride: 110 mmol/L (ref 101–111)
Creatinine, Ser: 0.91 mg/dL (ref 0.61–1.24)
GFR calc Af Amer: >60 mL/min (ref >60)
GFR calc non Af Amer: >60 mL/min (ref >60)
Glucose, Bld: 101 mg/dL — ABNORMAL HIGH (ref 65–99)
Potassium: 4 mmol/L (ref 3.5–5.1)
Sodium: 141 mmol/L (ref 135–145)

## 2017-12-20 LAB — LIPID PANEL
Cholesterol: 197 mg/dL (ref 0–200)
HDL: 34 mg/dL — ABNORMAL LOW (ref >40)
LDL Cholesterol: 136 mg/dL — ABNORMAL HIGH (ref 0–99)
Total CHOL/HDL Ratio: 5.8 RATIO
Triglycerides: 134 mg/dL (ref <150)
VLDL: 27 mg/dL (ref 0–40)

## 2017-12-20 MED FILL — Lidocaine HCl Local Inj 1%: INTRAMUSCULAR | Qty: 60 | Status: AC

## 2017-12-20 NOTE — Care Management Note (Signed)
Case Management Note  Patient Details  Name: Bill Castro MRN: [id number] Date of Birth: [birth year]  Subjective/Objective:   From home alone, pta indep, s/p pace maker.  He states he has family that lives close by and neighbors if he needs any assistance but states he is pretty indep.                 Action/Plan: DC home no needs.   Expected Discharge Date:  12/20/17               Expected Discharge Plan:  Home/Self Care  In-House Referral:     Discharge planning Services  CM Consult  Post Acute Care Choice:    Choice offered to:     DME Arranged:    DME Agency:     HH Arranged:    HH Agency:     Status of Service:  Completed, signed off  If discussed at [employer] Length of Stay Meetings, dates discussed:    Additional Comments:  Leone Havenaylor, Jaquel [place], RN 12/20/2017, 9:53 AM

## 2017-12-20 NOTE — Discharge Instructions (Signed)
° ° °  Supplemental Discharge Instructions for  Pacemaker/Defibrillator Patients  Activity No heavy lifting or vigorous activity with your left/right arm for 6 to 8 weeks.  Do not raise your left/right arm above your head for one week.  Gradually raise your affected arm as drawn below.              12/23/17                    12/24/17                     12/25/17                   12/26/17 __  NO DRIVING for one week, you can resume driving 9/60/451/30/19  WOUND CARE - Keep the wound area clean and dry.  Do not get this area wet for one week. No showers for one week; you may shower on 12/26/17 . - The tape/steri-strips on your wound will fall off; do not pull them off.  No bandage is needed on the site.  DO  NOT apply any creams, oils, or ointments to the wound area. - If you notice any drainage or discharge from the wound, any swelling or bruising at the site, or you develop a fever > 101? F after you are discharged home, call the office at once.  Special Instructions - You are still able to use cellular telephones; use the ear opposite the side where you have your pacemaker/defibrillator.  Avoid carrying your cellular phone near your device. - When traveling through airports, show security personnel your identification card to avoid being screened in the metal detectors.  Ask the security personnel to use the hand wand. - Avoid arc welding equipment, MRI testing (magnetic resonance imaging), TENS units (transcutaneous nerve stimulators).  Call the office for questions about other devices. - Avoid electrical appliances that are in poor condition or are not properly grounded. - Microwave ovens are safe to be near or to operate.  Additional information for defibrillator patients should your device go off: - If your device goes off ONCE and you feel fine afterward, notify the device clinic nurses. - If your device goes off ONCE and you do not feel well afterward, call 911. - If your device goes off  TWICE, call 911. - If your device goes off THREE times in one day, call 911.  DO NOT DRIVE YOURSELF OR A FAMILY MEMBER WITH A DEFIBRILLATOR TO THE HOSPITAL--CALL 911.

## 2017-12-24 ENCOUNTER — Encounter: Payer: Self-pay | Admitting: Internal Medicine

## 2017-12-25 NOTE — Telephone Encounter (Signed)
Spoke with pt informed him not to take the bandage. The bandage will be removed at his 2/6 OV. Pt voiced understanding

## 2017-12-28 ENCOUNTER — Encounter: Payer: Self-pay | Admitting: Internal Medicine

## 2018-01-02 ENCOUNTER — Ambulatory Visit (INDEPENDENT_AMBULATORY_CARE_PROVIDER_SITE_OTHER): Payer: Commercial Managed Care - PPO | Admitting: *Deleted

## 2018-01-02 DIAGNOSIS — I469 Cardiac arrest, cause unspecified: Secondary | ICD-10-CM

## 2018-01-02 DIAGNOSIS — I442 Atrioventricular block, complete: Secondary | ICD-10-CM | POA: Diagnosis not present

## 2018-01-02 LAB — CUP PACEART INCLINIC DEVICE CHECK
Battery Remaining Longevity: 185 mo
Battery Voltage: 3.21 V
Brady Statistic AP VP Percent: 0.01 %
Brady Statistic AP VS Percent: 0.61 %
Brady Statistic AS VP Percent: 0.03 %
Brady Statistic AS VS Percent: 99.35 %
Brady Statistic RA Percent Paced: 0.75 %
Brady Statistic RV Percent Paced: 0.04 %
Date Time Interrogation Session: 20190206094129
Implantable Lead Implant Date: 20190123
Implantable Lead Implant Date: 20190123
Implantable Lead Location: 753859
Implantable Lead Location: 753860
Implantable Lead Model: 5076
Implantable Lead Model: 5076
Implantable Pulse Generator Implant Date: 20190123
Lead Channel Impedance Value: 380 Ohm
Lead Channel Impedance Value: 437 Ohm
Lead Channel Impedance Value: 513 Ohm
Lead Channel Impedance Value: 551 Ohm
Lead Channel Pacing Threshold Amplitude: 0.75 V
Lead Channel Pacing Threshold Amplitude: 1.25 V
Lead Channel Pacing Threshold Pulse Width: 0.4 ms
Lead Channel Pacing Threshold Pulse Width: 0.4 ms
Lead Channel Sensing Intrinsic Amplitude: 1.9 mV
Lead Channel Sensing Intrinsic Amplitude: 15.875 mV
Lead Channel Setting Pacing Amplitude: 3.5 V
Lead Channel Setting Pacing Amplitude: 3.5 V
Lead Channel Setting Pacing Pulse Width: 0.4 ms
Lead Channel Setting Sensing Sensitivity: 1.2 mV

## 2018-01-02 NOTE — Progress Notes (Signed)
Wound check appointment. Steri-strips removed. Wound without redness or edema. Incision edges approximated, wound well healed. Normal device function. Thresholds, sensing, and impedances consistent with implant measurements. Device programmed at 3.5V for extra safety margin until 3 month visit. Histogram distribution appropriate for patient and level of activity. 2 fast A&V episodes-- EGMs appear 1:1, Max rate 158/158 bpm, longest duration 1 minute 51 seconds, patient states asymptomatic. No mode switches or high ventricular rates noted. Patient educated about wound care, arm mobility, lifting restrictions. ROV with GT/R 03/21/18.

## 2018-01-08 ENCOUNTER — Encounter: Payer: Self-pay | Admitting: Internal Medicine

## 2018-01-09 ENCOUNTER — Encounter: Payer: Self-pay | Admitting: Neurology

## 2018-01-09 NOTE — Telephone Encounter (Signed)
[name] called to verify information in MyChart message. He is appreciative of information/help.

## 2018-01-13 ENCOUNTER — Encounter: Payer: Self-pay | Admitting: Neurology

## 2018-01-31 ENCOUNTER — Encounter: Payer: Self-pay | Admitting: Internal Medicine

## 2018-02-18 ENCOUNTER — Encounter: Payer: Self-pay | Admitting: Internal Medicine

## 2018-02-27 ENCOUNTER — Ambulatory Visit: Payer: Commercial Managed Care - PPO | Admitting: Neurology

## 2018-03-10 ENCOUNTER — Encounter (HOSPITAL_COMMUNITY): Payer: Self-pay | Admitting: Emergency Medicine

## 2018-03-10 ENCOUNTER — Ambulatory Visit (HOSPITAL_COMMUNITY)
Admission: EM | Admit: 2018-03-10 | Discharge: 2018-03-10 | Disposition: A | Discharge status: Home / Self Care | Payer: Commercial Managed Care - PPO | Attending: Internal Medicine | Admitting: Internal Medicine

## 2018-03-10 ENCOUNTER — Other Ambulatory Visit: Payer: Self-pay

## 2018-03-10 DIAGNOSIS — Z950 Presence of cardiac pacemaker: Secondary | ICD-10-CM

## 2018-03-10 DIAGNOSIS — Z9109 Other allergy status, other than to drugs and biological substances: Secondary | ICD-10-CM

## 2018-03-10 DIAGNOSIS — R42 Dizziness and giddiness: Secondary | ICD-10-CM

## 2018-03-10 DIAGNOSIS — T7840XA Allergy, unspecified, initial encounter: Secondary | ICD-10-CM | POA: Diagnosis not present

## 2018-03-10 DIAGNOSIS — I429 Cardiomyopathy, unspecified: Secondary | ICD-10-CM | POA: Diagnosis not present

## 2018-03-10 MED ORDER — FLUTICASONE PROPIONATE 50 MCG/ACT NA SUSP: 2.0000 | Freq: Every day | NASAL | 11 refills | Status: DC

## 2018-03-10 MED ORDER — CETIRIZINE HCL 10 MG PO TABS: 10.0000 mg | ORAL_TABLET | Freq: Every day | ORAL | 1 refills | Status: DC
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
## 2018-03-10 NOTE — Discharge Instructions (Signed)
Hydrate well with at least 2 liters (1 gallon) of water daily. You can use Opcon A for your eyes.

## 2018-03-10 NOTE — ED Triage Notes (Signed)
2 episodes dizziness today.  Described as fleeting episodes of dizziness.  No sob, no pain noticed, but felt like a fluttering in chest.    Denies cough, cold , runny nose.  Patient has had itchy ears and burning sinus.    Pacemaker placed in January 2019.

## 2018-03-21 ENCOUNTER — Ambulatory Visit (INDEPENDENT_AMBULATORY_CARE_PROVIDER_SITE_OTHER): Payer: Commercial Managed Care - PPO | Admitting: Internal Medicine

## 2018-03-21 ENCOUNTER — Encounter: Payer: Self-pay | Admitting: Internal Medicine

## 2018-03-21 VITALS — BP 166/98 | HR 97 | Ht 73.0 in | Wt 267.0 lb

## 2018-03-21 DIAGNOSIS — I10 Essential (primary) hypertension: Secondary | ICD-10-CM | POA: Diagnosis not present

## 2018-03-21 DIAGNOSIS — I471 Supraventricular tachycardia: Secondary | ICD-10-CM | POA: Diagnosis not present

## 2018-03-21 DIAGNOSIS — R55 Syncope and collapse: Secondary | ICD-10-CM

## 2018-03-21 MED ORDER — METOPROLOL SUCCINATE ER 25 MG PO TB24: 25.0000 mg | ORAL_TABLET | Freq: Every day | ORAL | 3 refills | Status: DC

## 2018-03-21 NOTE — Patient Instructions (Signed)
Medication Instructions:  Your physician recommends that you continue on your current medications as directed. Please refer to the Current Medication list given to you today.  Start Toprol 25 mg Daily   Labwork: NONE   Testing/Procedures: NONE   Follow-Up: Your physician wants you to follow-up in: 9 Months with Dr. Ladona Ridgelaylor. You will receive a reminder letter in the mail two months in advance. If you don't receive a letter, please call our office to schedule the follow-up appointment.   Any Other Special Instructions Will Be Listed Below (If Applicable).     If you need a refill on your cardiac medications before your next appointment, please call your pharmacy. Thank you for choosing [facility]!
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
## 2018-03-25 ENCOUNTER — Encounter: Payer: Self-pay | Admitting: Internal Medicine

## 2018-03-27 ENCOUNTER — Encounter: Payer: Self-pay | Admitting: Neurology

## 2018-05-23 ENCOUNTER — Encounter

## 2018-05-23 ENCOUNTER — Ambulatory Visit: Payer: Commercial Managed Care - PPO | Admitting: Neurology

## 2018-06-07 ENCOUNTER — Encounter: Payer: Self-pay | Admitting: Internal Medicine

## 2018-06-12 ENCOUNTER — Telehealth: Payer: Self-pay | Admitting: *Deleted

## 2018-06-12 ENCOUNTER — Encounter: Payer: Self-pay | Admitting: Internal Medicine

## 2018-06-12 NOTE — Telephone Encounter (Signed)
Error

## 2018-06-13 ENCOUNTER — Telehealth: Payer: Self-pay

## 2018-06-13 NOTE — Telephone Encounter (Signed)
Pt stated he felt some light pain in his chest. Sent a transmission. I let him talk to the device tech nurse.

## 2018-06-13 NOTE — Telephone Encounter (Signed)
Remote transmission reviewed. Presenting rhythm: AsVs. No atrial or ventricular arrhythmias recorded. Stable lead measurements. Normal device function.  Informed patient about the results of his remote transmission. I told patient that the ppm will not inform us whether or not he has a blockage or is experiencing an MI. I instructed patient to go to the ER for any persistent or worsening chest discomfort. Patient verbalized understanding.

## 2018-06-24 ENCOUNTER — Ambulatory Visit (INDEPENDENT_AMBULATORY_CARE_PROVIDER_SITE_OTHER): Payer: Commercial Managed Care - PPO | Admitting: *Deleted

## 2018-06-24 DIAGNOSIS — R55 Syncope and collapse: Secondary | ICD-10-CM

## 2018-06-24 NOTE — Progress Notes (Signed)
Remote pacemaker transmission.   

## 2018-06-25 ENCOUNTER — Encounter: Payer: Self-pay | Admitting: Cardiology

## 2018-06-28 LAB — CUP PACEART REMOTE DEVICE CHECK
Battery Remaining Longevity: 178 mo
Battery Voltage: 3.15 V
Brady Statistic AP VP Percent: 4.79 %
Brady Statistic AP VS Percent: 2.9 %
Brady Statistic AS VP Percent: 0.07 %
Brady Statistic AS VS Percent: 92.25 %
Brady Statistic RA Percent Paced: 7.76 %
Brady Statistic RV Percent Paced: 4.86 %
Date Time Interrogation Session: 20190729060432
Implantable Lead Implant Date: 20190123
Implantable Lead Implant Date: 20190123
Implantable Lead Location: 753859
Implantable Lead Location: 753860
Implantable Lead Model: 5076
Implantable Lead Model: 5076
Implantable Pulse Generator Implant Date: 20190123
Lead Channel Impedance Value: 361 Ohm
Lead Channel Impedance Value: 399 Ohm
Lead Channel Impedance Value: 418 Ohm
Lead Channel Impedance Value: 513 Ohm
Lead Channel Pacing Threshold Amplitude: 0.75 V
Lead Channel Pacing Threshold Amplitude: 1.125 V
Lead Channel Pacing Threshold Pulse Width: 0.4 ms
Lead Channel Pacing Threshold Pulse Width: 0.4 ms
Lead Channel Sensing Intrinsic Amplitude: 1.625 mV
Lead Channel Sensing Intrinsic Amplitude: 1.625 mV
Lead Channel Sensing Intrinsic Amplitude: 8.75 mV
Lead Channel Sensing Intrinsic Amplitude: 8.75 mV
Lead Channel Setting Pacing Amplitude: 2.25 V
Lead Channel Setting Pacing Amplitude: 2.5 V
Lead Channel Setting Pacing Pulse Width: 0.4 ms
Lead Channel Setting Sensing Sensitivity: 1.2 mV

## 2018-09-23 ENCOUNTER — Ambulatory Visit (INDEPENDENT_AMBULATORY_CARE_PROVIDER_SITE_OTHER): Payer: Commercial Managed Care - PPO | Admitting: *Deleted

## 2018-09-23 ENCOUNTER — Telehealth: Payer: Self-pay | Admitting: Cardiology

## 2018-09-23 DIAGNOSIS — R55 Syncope and collapse: Secondary | ICD-10-CM

## 2018-09-23 DIAGNOSIS — I442 Atrioventricular block, complete: Secondary | ICD-10-CM

## 2018-09-23 NOTE — Telephone Encounter (Signed)
LMOVM reminding pt to send remote transmission.   

## 2018-09-24 ENCOUNTER — Telehealth: Payer: Self-pay | Admitting: Cardiology

## 2018-09-24 NOTE — Telephone Encounter (Signed)
Patient called and stated that he took his dog to the vet today and he was holding her and she scratched him right at the device site and he is now afraid that his device could have been damaged during this. Pt is going to send a remote transmission. Please call him back with the results.

## 2018-09-24 NOTE — Progress Notes (Signed)
Remote pacemaker transmission.   

## 2018-09-24 NOTE — Telephone Encounter (Signed)
Remote transmission reviewed. Presenting rhythm: AsVs. No atrial or ventricular arrhythmias noted. <0.1% Ap/Vp. Stable lead measurements. Normal device function.  Called to inform patient about remote results. Patient verbalized understanding and appreciation of information.

## 2018-10-05 ENCOUNTER — Ambulatory Visit (HOSPITAL_COMMUNITY)
Admission: EM | Admit: 2018-10-05 | Discharge: 2018-10-05 | Disposition: A | Discharge status: Home / Self Care | Payer: Commercial Managed Care - PPO | Attending: Family Medicine | Admitting: Family Medicine

## 2018-10-05 ENCOUNTER — Encounter (HOSPITAL_COMMUNITY): Payer: Self-pay | Admitting: Emergency Medicine

## 2018-10-05 DIAGNOSIS — J0100 Acute maxillary sinusitis, unspecified: Secondary | ICD-10-CM | POA: Diagnosis not present

## 2018-10-05 MED ORDER — DOXYCYCLINE HYCLATE 100 MG PO CAPS: 100.0000 mg | ORAL_CAPSULE | Freq: Two times a day (BID) | ORAL | 0 refills | Status: DC

## 2018-10-05 NOTE — Discharge Instructions (Signed)
Get plenty of rest and push fluids Doxycycline prescribed.  Use as directed and to completion Follow up with PCP if symptoms persist Return or go to ER if you have any new or worsening symptoms fever, chills, nausea, vomiting, chest pain, cough, shortness of breath, wheezing, abdominal pain, changes in bowel or bladder habits, etc..Marland Kitchen

## 2018-10-05 NOTE — ED Triage Notes (Signed)
Pt c/o congestion, headache, facial pain, ear pain for several days.
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
## 2018-10-29 NOTE — Telephone Encounter (Signed)
Remote transmission reviewed. Presenting rhythm: AsVs @ 100bpm. No atrial or ventricular arrhythmias recorded. 1.5%Ap/0.4%Vp. Stable lead measurements. Normal device function.

## 2018-11-01 ENCOUNTER — Telehealth: Payer: Self-pay

## 2018-11-01 MED ORDER — METOPROLOL SUCCINATE ER 50 MG PO TB24: 50.0000 mg | ORAL_TABLET | Freq: Every day | ORAL | 3 refills | Status: DC

## 2018-11-01 NOTE — Telephone Encounter (Signed)
Discussed with Dr. Ladona Ridgelaylor  Will increase Toprol XL to 50 mg daily for palpitations and HR's in the 100's.  Order placed.  Pt notified via MyChart.

## 2018-11-01 NOTE — Telephone Encounter (Signed)
Sent message via MyChart.

## 2018-11-01 NOTE — Telephone Encounter (Signed)
Pt called you back and want to talk to you about the my chart message. I told him that Dr. Ladona Ridgelaylor nurse Boneta LucksJenny will give him a call back. The best number to call him [phone]

## 2018-11-09 ENCOUNTER — Emergency Department (HOSPITAL_COMMUNITY)
Admission: EM | Admit: 2018-11-09 | Discharge: 2018-11-09 | Disposition: A | Discharge status: Home / Self Care | Payer: Commercial Managed Care - PPO | Attending: Emergency Medicine | Admitting: Emergency Medicine

## 2018-11-09 ENCOUNTER — Encounter (HOSPITAL_COMMUNITY): Payer: Self-pay | Admitting: Emergency Medicine

## 2018-11-09 ENCOUNTER — Other Ambulatory Visit: Payer: Self-pay

## 2018-11-09 DIAGNOSIS — Z7982 Long term (current) use of aspirin: Secondary | ICD-10-CM | POA: Insufficient documentation

## 2018-11-09 DIAGNOSIS — R42 Dizziness and giddiness: Secondary | ICD-10-CM

## 2018-11-09 DIAGNOSIS — Z79899 Other long term (current) drug therapy: Secondary | ICD-10-CM | POA: Insufficient documentation

## 2018-11-09 DIAGNOSIS — E039 Hypothyroidism, unspecified: Secondary | ICD-10-CM | POA: Insufficient documentation

## 2018-11-09 DIAGNOSIS — F17210 Nicotine dependence, cigarettes, uncomplicated: Secondary | ICD-10-CM | POA: Insufficient documentation

## 2018-11-09 DIAGNOSIS — Z85828 Personal history of other malignant neoplasm of skin: Secondary | ICD-10-CM | POA: Insufficient documentation

## 2018-11-09 DIAGNOSIS — Z950 Presence of cardiac pacemaker: Secondary | ICD-10-CM | POA: Insufficient documentation

## 2018-11-09 LAB — BASIC METABOLIC PANEL
Anion gap: 10 (ref 5–15)
BUN: 13 mg/dL (ref 6–20)
CO2: 22 mmol/L (ref 22–32)
Calcium: 9.1 mg/dL (ref 8.9–10.3)
Chloride: 104 mmol/L (ref 98–111)
Creatinine, Ser: 0.9 mg/dL (ref 0.61–1.24)
GFR calc Af Amer: >60 mL/min (ref >60)
GFR calc non Af Amer: >60 mL/min (ref >60)
Glucose, Bld: 105 mg/dL — ABNORMAL HIGH (ref 70–99)
Potassium: 3.8 mmol/L (ref 3.5–5.1)
Sodium: 136 mmol/L (ref 135–145)

## 2018-11-09 LAB — CBC WITH DIFFERENTIAL/PLATELET
Abs Immature Granulocytes: 0.02 10*3/uL (ref 0.00–0.07)
Basophils Absolute: 0.1 10*3/uL (ref 0.0–0.1)
Basophils Relative: 1 %
Eosinophils Absolute: 0.2 10*3/uL (ref 0.0–0.5)
Eosinophils Relative: 3 %
HCT: 46.7 % (ref 39.0–52.0)
Hemoglobin: 15.4 g/dL (ref 13.0–17.0)
Immature Granulocytes: 0 %
Lymphocytes Relative: 26 %
Lymphs Abs: 2.4 10*3/uL (ref 0.7–4.0)
MCH: 32.2 pg (ref 26.0–34.0)
MCHC: 33 g/dL (ref 30.0–36.0)
MCV: 97.7 fL (ref 80.0–100.0)
Monocytes Absolute: 0.7 10*3/uL (ref 0.1–1.0)
Monocytes Relative: 7 %
Neutro Abs: 5.8 10*3/uL (ref 1.7–7.7)
Neutrophils Relative %: 63 %
Platelets: 295 10*3/uL (ref 150–400)
RBC: 4.78 MIL/uL (ref 4.22–5.81)
RDW: 12.9 % (ref 11.5–15.5)
WBC: 9.2 10*3/uL (ref 4.0–10.5)
nRBC: 0 % (ref 0.0–0.2)

## 2018-11-09 LAB — TROPONIN I: Troponin I: <0.03 ng/mL (ref <0.03)

## 2018-11-09 NOTE — ED Triage Notes (Addendum)
Patient c/o intermittent episodes of dizziness. Per patient woke this morning at 11am and had some "slight dizziness" that went away. Per patient approx 2 hours prior to arriving here to ED was sitting and talking on phone had a more severe dizzy spell lasting approx 1 minute. Patient states "I started panicking a little." Denies headache but reports had numbness and tingling in right hand that disappeared when dizziness did. Denies headache, slurred speech, or chest pain. Patient does have pacemaker and afib. Patient states blood pressure went from 125/80 to 138/85. Patient also reports sinus pressure. Patient also states that a week ago his Toprol was increased due to increased heart rate.

## 2018-11-17 LAB — CUP PACEART REMOTE DEVICE CHECK
Battery Remaining Longevity: 174 mo
Battery Voltage: 3.11 V
Brady Statistic AP VP Percent: 2.44 %
Brady Statistic AP VS Percent: 3.32 %
Brady Statistic AS VP Percent: 0.05 %
Brady Statistic AS VS Percent: 94.19 %
Brady Statistic RA Percent Paced: 5.83 %
Brady Statistic RV Percent Paced: 2.49 %
Date Time Interrogation Session: 20191028154104
Implantable Lead Implant Date: 20190123
Implantable Lead Implant Date: 20190123
Implantable Lead Location: 753859
Implantable Lead Location: 753860
Implantable Lead Model: 5076
Implantable Lead Model: 5076
Implantable Pulse Generator Implant Date: 20190123
Lead Channel Impedance Value: 342 Ohm
Lead Channel Impedance Value: 380 Ohm
Lead Channel Impedance Value: 399 Ohm
Lead Channel Impedance Value: 627 Ohm
Lead Channel Pacing Threshold Amplitude: 0.875 V
Lead Channel Pacing Threshold Amplitude: 1.375 V
Lead Channel Pacing Threshold Pulse Width: 0.4 ms
Lead Channel Pacing Threshold Pulse Width: 0.4 ms
Lead Channel Sensing Intrinsic Amplitude: 1.125 mV
Lead Channel Sensing Intrinsic Amplitude: 1.125 mV
Lead Channel Sensing Intrinsic Amplitude: 8 mV
Lead Channel Sensing Intrinsic Amplitude: 8 mV
Lead Channel Setting Pacing Amplitude: 2.5 V
Lead Channel Setting Pacing Amplitude: 2.75 V
Lead Channel Setting Pacing Pulse Width: 0.4 ms
Lead Channel Setting Sensing Sensitivity: 1.2 mV
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
## 2018-12-23 ENCOUNTER — Ambulatory Visit (INDEPENDENT_AMBULATORY_CARE_PROVIDER_SITE_OTHER): Payer: Commercial Managed Care - PPO

## 2018-12-23 DIAGNOSIS — R55 Syncope and collapse: Secondary | ICD-10-CM

## 2018-12-23 DIAGNOSIS — I442 Atrioventricular block, complete: Secondary | ICD-10-CM | POA: Diagnosis not present

## 2018-12-24 LAB — CUP PACEART REMOTE DEVICE CHECK
Battery Remaining Longevity: 172 mo
Battery Voltage: 3.05 V
Brady Statistic AP VP Percent: 0.07 %
Brady Statistic AP VS Percent: 0.32 %
Brady Statistic AS VP Percent: 0.03 %
Brady Statistic AS VS Percent: 99.57 %
Brady Statistic RA Percent Paced: 0.43 %
Brady Statistic RV Percent Paced: 0.1 %
Date Time Interrogation Session: 20200127062015
Implantable Lead Implant Date: 20190123
Implantable Lead Implant Date: 20190123
Implantable Lead Location: 753859
Implantable Lead Location: 753860
Implantable Lead Model: 5076
Implantable Lead Model: 5076
Implantable Pulse Generator Implant Date: 20190123
Lead Channel Impedance Value: 342 Ohm
Lead Channel Impedance Value: 399 Ohm
Lead Channel Impedance Value: 437 Ohm
Lead Channel Impedance Value: 608 Ohm
Lead Channel Pacing Threshold Amplitude: 1 V
Lead Channel Pacing Threshold Amplitude: 1 V
Lead Channel Pacing Threshold Pulse Width: 0.4 ms
Lead Channel Pacing Threshold Pulse Width: 0.4 ms
Lead Channel Sensing Intrinsic Amplitude: 1.125 mV
Lead Channel Sensing Intrinsic Amplitude: 1.125 mV
Lead Channel Sensing Intrinsic Amplitude: 8 mV
Lead Channel Sensing Intrinsic Amplitude: 8 mV
Lead Channel Setting Pacing Amplitude: 2 V
Lead Channel Setting Pacing Amplitude: 2.5 V
Lead Channel Setting Pacing Pulse Width: 0.4 ms
Lead Channel Setting Sensing Sensitivity: 1.2 mV

## 2018-12-24 NOTE — Progress Notes (Signed)
Remote pacemaker transmission.   

## 2018-12-31 NOTE — Telephone Encounter (Signed)
See MyChart message from 12/23/18.

## 2019-03-24 ENCOUNTER — Other Ambulatory Visit: Payer: Self-pay

## 2019-03-24 ENCOUNTER — Ambulatory Visit (INDEPENDENT_AMBULATORY_CARE_PROVIDER_SITE_OTHER): Payer: Commercial Managed Care - PPO | Admitting: *Deleted

## 2019-03-24 DIAGNOSIS — I442 Atrioventricular block, complete: Secondary | ICD-10-CM

## 2019-03-24 DIAGNOSIS — R55 Syncope and collapse: Secondary | ICD-10-CM

## 2019-03-24 LAB — CUP PACEART REMOTE DEVICE CHECK
Battery Remaining Longevity: 169 mo
Battery Voltage: 3.03 V
Brady Statistic AP VP Percent: 0.01 %
Brady Statistic AP VS Percent: 0.1 %
Brady Statistic AS VP Percent: 0.03 %
Brady Statistic AS VS Percent: 99.86 %
Brady Statistic RA Percent Paced: 0.14 %
Brady Statistic RV Percent Paced: 0.04 %
Date Time Interrogation Session: 20200427091002
Implantable Lead Implant Date: 20190123
Implantable Lead Implant Date: 20190123
Implantable Lead Location: 753859
Implantable Lead Location: 753860
Implantable Lead Model: 5076
Implantable Lead Model: 5076
Implantable Pulse Generator Implant Date: 20190123
Lead Channel Impedance Value: 323 Ohm
Lead Channel Impedance Value: 380 Ohm
Lead Channel Impedance Value: 380 Ohm
Lead Channel Impedance Value: 570 Ohm
Lead Channel Pacing Threshold Amplitude: 1.125 V
Lead Channel Pacing Threshold Amplitude: 1.25 V
Lead Channel Pacing Threshold Pulse Width: 0.4 ms
Lead Channel Pacing Threshold Pulse Width: 0.4 ms
Lead Channel Sensing Intrinsic Amplitude: 1.25 mV
Lead Channel Sensing Intrinsic Amplitude: 1.25 mV
Lead Channel Sensing Intrinsic Amplitude: 8.5 mV
Lead Channel Sensing Intrinsic Amplitude: 8.5 mV
Lead Channel Setting Pacing Amplitude: 2.25 V
Lead Channel Setting Pacing Amplitude: 2.5 V
Lead Channel Setting Pacing Pulse Width: 0.4 ms
Lead Channel Setting Sensing Sensitivity: 1.2 mV

## 2019-03-25 ENCOUNTER — Encounter: Payer: Commercial Managed Care - PPO | Admitting: Internal Medicine

## 2019-03-31 NOTE — Progress Notes (Signed)
Remote ICD transmission.   

## 2019-04-26 ENCOUNTER — Telehealth: Payer: Self-pay | Admitting: Physician Assistant

## 2019-04-26 ENCOUNTER — Ambulatory Visit (HOSPITAL_COMMUNITY)
Admission: EM | Admit: 2019-04-26 | Discharge: 2019-04-26 | Disposition: A | Discharge status: Home / Self Care | Payer: Commercial Managed Care - PPO | Attending: Family Medicine | Admitting: Family Medicine

## 2019-04-26 ENCOUNTER — Encounter (HOSPITAL_COMMUNITY): Payer: Self-pay | Admitting: Physician Assistant

## 2019-04-26 ENCOUNTER — Other Ambulatory Visit: Payer: Self-pay

## 2019-04-26 DIAGNOSIS — R0789 Other chest pain: Secondary | ICD-10-CM

## 2019-04-26 DIAGNOSIS — M546 Pain in thoracic spine: Secondary | ICD-10-CM

## 2019-04-26 NOTE — ED Triage Notes (Signed)
Per pt he had been having middle back pain for a few days now. Pt said when he lays down it feels better. Says feels like a strain
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
## 2019-04-26 NOTE — Discharge Instructions (Signed)
Your EKG unchanged from prior. Take tylenol for now for pain. Please follow up with cardiology for further evaluation needed. If experiencing acute chest pain, with shortness of breath, nausea/vomiting, sweating, go to the ED for further evaluation needed.

## 2019-04-26 NOTE — Telephone Encounter (Signed)
PPM in 2019, not pacer dependent. Implanted for CHB.  Thursday night had a burning sensation in his back. Worse the next day. Has been doing yard work, which is unusual for him. Likely overexerted, per patient. Last night burning in the middle of his chest pain.   Went to UC at [facility]. EKG was abnormal.  When he stands up straight, his back pain subsides. But chest pain radiates to his right arm at times. Nothing makes it worse. Standing up straight makes it better. He also reports feeling GERD last night with lots of belching acid.   He is a current smoker. He does not have HTN or HLD. Both parents had heart disease.   EKGs and clinical case was reviewed with Dr. Tenny Craw (DOD) and Dr. Ladona Ridgel and deemed not acute. I advised tylenol and ibuprofen. He will call back if not better next week.   Bill Rutherford Duke, PA-C 04/26/2019, 1:14 PM

## 2019-05-05 ENCOUNTER — Other Ambulatory Visit: Payer: Self-pay | Admitting: Internal Medicine

## 2019-06-05 ENCOUNTER — Other Ambulatory Visit: Payer: Self-pay

## 2019-06-05 ENCOUNTER — Telehealth: Payer: Self-pay | Admitting: Internal Medicine

## 2019-06-05 ENCOUNTER — Encounter: Payer: Self-pay | Admitting: Internal Medicine

## 2019-06-05 ENCOUNTER — Ambulatory Visit (INDEPENDENT_AMBULATORY_CARE_PROVIDER_SITE_OTHER): Payer: Commercial Managed Care - PPO | Admitting: Internal Medicine

## 2019-06-05 VITALS — BP 130/90 | HR 99 | Temp 98.7°F | Ht 73.0 in | Wt 215.0 lb

## 2019-06-05 DIAGNOSIS — Z950 Presence of cardiac pacemaker: Secondary | ICD-10-CM

## 2019-06-05 DIAGNOSIS — I442 Atrioventricular block, complete: Secondary | ICD-10-CM

## 2019-06-05 LAB — CUP PACEART INCLINIC DEVICE CHECK
Battery Remaining Longevity: 167 mo
Battery Voltage: 3.02 V
Brady Statistic AP VP Percent: 0.38 %
Brady Statistic AP VS Percent: 1.07 %
Brady Statistic AS VP Percent: 0.02 %
Brady Statistic AS VS Percent: 98.52 %
Brady Statistic RA Percent Paced: 1.49 %
Brady Statistic RV Percent Paced: 0.4 %
Date Time Interrogation Session: 20200709122243
Implantable Lead Implant Date: 20190123
Implantable Lead Implant Date: 20190123
Implantable Lead Location: 753859
Implantable Lead Location: 753860
Implantable Lead Model: 5076
Implantable Lead Model: 5076
Implantable Pulse Generator Implant Date: 20190123
Lead Channel Impedance Value: 323 Ohm
Lead Channel Impedance Value: 399 Ohm
Lead Channel Impedance Value: 418 Ohm
Lead Channel Impedance Value: 608 Ohm
Lead Channel Pacing Threshold Amplitude: 1 V
Lead Channel Pacing Threshold Amplitude: 1.5 V
Lead Channel Pacing Threshold Pulse Width: 0.4 ms
Lead Channel Pacing Threshold Pulse Width: 0.4 ms
Lead Channel Sensing Intrinsic Amplitude: 2.875 mV
Lead Channel Sensing Intrinsic Amplitude: 8.5 mV
Lead Channel Setting Pacing Amplitude: 2.25 V
Lead Channel Setting Pacing Amplitude: 2.75 V
Lead Channel Setting Pacing Pulse Width: 0.4 ms
Lead Channel Setting Sensing Sensitivity: 1.2 mV

## 2019-06-05 MED ORDER — METOPROLOL TARTRATE 25 MG PO TABS: ORAL_TABLET | ORAL | 3 refills | Status: DC
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
## 2019-06-05 NOTE — Patient Instructions (Addendum)
Medication Instructions: Your physician recommends that you continue on your current medications as directed. Please refer to the Current Medication list given to you today.   Labwork: None  Procedures/Testing: None  Follow-Up: 6 months with [place] PA-C,   1 year with Dr.Taylor remote 09/04/2019  Any Additional Special Instructions Will Be Listed Below (If Applicable).     If you need a refill on your cardiac medications before your next appointment, please call your pharmacy.      Thank you for choosing [facility] !

## 2019-06-05 NOTE — Telephone Encounter (Signed)
Pt called back after his visit w/ Dr. Lovena Le stating that he and Dr. Lovena Le discussed changing his metoprolol succinate (TOPROL-XL) 50 MG 24 hr tablet [[id number]] ENDED  Please give pt a call @ [phone]

## 2019-06-10 ENCOUNTER — Ambulatory Visit (HOSPITAL_COMMUNITY)
Admission: RE | Admit: 2019-06-10 | Discharge: 2019-06-10 | Disposition: A | Discharge status: Home / Self Care | Payer: Commercial Managed Care - PPO | Source: Ambulatory Visit | Attending: Family Medicine | Admitting: Family Medicine

## 2019-06-10 ENCOUNTER — Other Ambulatory Visit: Payer: Self-pay

## 2019-06-10 ENCOUNTER — Other Ambulatory Visit (HOSPITAL_COMMUNITY): Payer: Self-pay | Admitting: Family Medicine

## 2019-06-10 ENCOUNTER — Telehealth: Payer: Self-pay

## 2019-06-10 DIAGNOSIS — M546 Pain in thoracic spine: Secondary | ICD-10-CM

## 2019-06-10 DIAGNOSIS — I469 Cardiac arrest, cause unspecified: Secondary | ICD-10-CM | POA: Diagnosis present

## 2019-06-10 NOTE — Telephone Encounter (Signed)
Pt wanted to know will it be okay for him to have an X-ray. I told him per Jonni Sanger, [place] it was okay for him to have an x-ray. Pt verbalized understanding.

## 2019-07-02 ENCOUNTER — Other Ambulatory Visit (HOSPITAL_COMMUNITY): Payer: Self-pay | Admitting: Internal Medicine

## 2019-07-02 ENCOUNTER — Other Ambulatory Visit: Payer: Self-pay | Admitting: Internal Medicine

## 2019-07-02 DIAGNOSIS — M546 Pain in thoracic spine: Secondary | ICD-10-CM

## 2019-07-09 ENCOUNTER — Other Ambulatory Visit (HOSPITAL_COMMUNITY): Payer: Self-pay | Admitting: Internal Medicine

## 2019-07-09 ENCOUNTER — Other Ambulatory Visit: Payer: Self-pay | Admitting: Internal Medicine

## 2019-07-09 DIAGNOSIS — R1310 Dysphagia, unspecified: Secondary | ICD-10-CM

## 2019-07-10 ENCOUNTER — Telehealth: Payer: Self-pay

## 2019-07-10 NOTE — Telephone Encounter (Signed)
-   The pt states he was seen by his pcp and his pcp suggested that he takes the Metoprolol 50 mg in the morning and 25 mg everyday regardless. He wants to make sure with Dr. Lovena Le before he starts it the way his pcp wants him to take it. I told him I will send this to Dr. Lovena Le nurse and she will give him a call back.

## 2019-07-14 NOTE — Telephone Encounter (Signed)
I will forward pt's message to Dr.Taylor for review

## 2019-07-18 ENCOUNTER — Other Ambulatory Visit: Payer: Self-pay

## 2019-07-18 ENCOUNTER — Encounter: Payer: Self-pay | Admitting: Cardiology

## 2019-07-18 ENCOUNTER — Telehealth: Payer: Self-pay | Admitting: Cardiology

## 2019-07-18 ENCOUNTER — Ambulatory Visit (HOSPITAL_COMMUNITY)
Admission: RE | Admit: 2019-07-18 | Discharge: 2019-07-18 | Disposition: A | Discharge status: Home / Self Care | Payer: Commercial Managed Care - PPO | Source: Ambulatory Visit | Attending: Internal Medicine | Admitting: Internal Medicine

## 2019-07-18 DIAGNOSIS — M546 Pain in thoracic spine: Secondary | ICD-10-CM | POA: Insufficient documentation

## 2019-07-18 NOTE — Telephone Encounter (Signed)
After consulting w/ the Device Tech RN and she reviewed the carelink express transmission and determined that patient settings were back to pre MRI state. Informed pt that he is good to go. Pt verbalized understanding.

## 2019-07-18 NOTE — Telephone Encounter (Signed)
Patient called w/ questions after having an MRI. Does he need to come to the office to have his device checked.

## 2019-08-04 ENCOUNTER — Emergency Department (HOSPITAL_COMMUNITY)
Admission: EM | Admit: 2019-08-04 | Discharge: 2019-08-04 | Disposition: A | Discharge status: Home / Self Care | Payer: Commercial Managed Care - PPO | Attending: Emergency Medicine | Admitting: Emergency Medicine

## 2019-08-04 ENCOUNTER — Encounter (HOSPITAL_COMMUNITY): Payer: Self-pay | Admitting: Emergency Medicine

## 2019-08-04 ENCOUNTER — Other Ambulatory Visit: Payer: Self-pay

## 2019-08-04 ENCOUNTER — Emergency Department (HOSPITAL_COMMUNITY): Payer: Commercial Managed Care - PPO

## 2019-08-04 DIAGNOSIS — F17210 Nicotine dependence, cigarettes, uncomplicated: Secondary | ICD-10-CM | POA: Diagnosis not present

## 2019-08-04 DIAGNOSIS — Z7982 Long term (current) use of aspirin: Secondary | ICD-10-CM | POA: Diagnosis not present

## 2019-08-04 DIAGNOSIS — Z79899 Other long term (current) drug therapy: Secondary | ICD-10-CM | POA: Insufficient documentation

## 2019-08-04 DIAGNOSIS — R002 Palpitations: Secondary | ICD-10-CM | POA: Insufficient documentation

## 2019-08-04 DIAGNOSIS — E039 Hypothyroidism, unspecified: Secondary | ICD-10-CM | POA: Insufficient documentation

## 2019-08-04 DIAGNOSIS — Z950 Presence of cardiac pacemaker: Secondary | ICD-10-CM | POA: Insufficient documentation

## 2019-08-04 DIAGNOSIS — R0602 Shortness of breath: Secondary | ICD-10-CM | POA: Diagnosis not present

## 2019-08-04 DIAGNOSIS — Z85828 Personal history of other malignant neoplasm of skin: Secondary | ICD-10-CM | POA: Insufficient documentation

## 2019-08-04 LAB — COMPREHENSIVE METABOLIC PANEL
ALT: 23 U/L (ref 0–44)
AST: 18 U/L (ref 15–41)
Albumin: 4.3 g/dL (ref 3.5–5.0)
Alkaline Phosphatase: 59 U/L (ref 38–126)
Anion gap: 9 (ref 5–15)
BUN: 14 mg/dL (ref 6–20)
CO2: 23 mmol/L (ref 22–32)
Calcium: 9 mg/dL (ref 8.9–10.3)
Chloride: 105 mmol/L (ref 98–111)
Creatinine, Ser: 0.87 mg/dL (ref 0.61–1.24)
GFR calc Af Amer: >60 mL/min (ref >60)
GFR calc non Af Amer: >60 mL/min (ref >60)
Glucose, Bld: 97 mg/dL (ref 70–99)
Potassium: 4 mmol/L (ref 3.5–5.1)
Sodium: 137 mmol/L (ref 135–145)
Total Bilirubin: 0.7 mg/dL (ref 0.3–1.2)
Total Protein: 7.3 g/dL (ref 6.5–8.1)

## 2019-08-04 LAB — CBC WITH DIFFERENTIAL/PLATELET
Abs Immature Granulocytes: 0.02 10*3/uL (ref 0.00–0.07)
Basophils Absolute: 0.1 10*3/uL (ref 0.0–0.1)
Basophils Relative: 1 %
Eosinophils Absolute: 0.3 10*3/uL (ref 0.0–0.5)
Eosinophils Relative: 3 %
HCT: 46.3 % (ref 39.0–52.0)
Hemoglobin: 15.3 g/dL (ref 13.0–17.0)
Immature Granulocytes: 0 %
Lymphocytes Relative: 24 %
Lymphs Abs: 2.4 10*3/uL (ref 0.7–4.0)
MCH: 32.6 pg (ref 26.0–34.0)
MCHC: 33 g/dL (ref 30.0–36.0)
MCV: 98.7 fL (ref 80.0–100.0)
Monocytes Absolute: 0.6 10*3/uL (ref 0.1–1.0)
Monocytes Relative: 6 %
Neutro Abs: 6.8 10*3/uL (ref 1.7–7.7)
Neutrophils Relative %: 66 %
Platelets: 260 10*3/uL (ref 150–400)
RBC: 4.69 MIL/uL (ref 4.22–5.81)
RDW: 12.9 % (ref 11.5–15.5)
WBC: 10.2 10*3/uL (ref 4.0–10.5)
nRBC: 0 % (ref 0.0–0.2)

## 2019-08-04 LAB — TROPONIN I (HIGH SENSITIVITY)
Troponin I (High Sensitivity): 5 ng/L (ref <18)
Troponin I (High Sensitivity): 4 ng/L (ref <18)

## 2019-08-04 NOTE — ED Notes (Signed)
Patient transported to X-ray 

## 2019-08-04 NOTE — ED Notes (Signed)
Interrogated Medtronic pacemaker. Waiting for report from rep.
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
## 2019-08-04 NOTE — ED Notes (Signed)
Spoke with Wells Guiles (medtronic rep). Nothing new on report since last interrogated in August. EDP notified. Rep will fax report.

## 2019-08-04 NOTE — ED Triage Notes (Signed)
PT c/o palpitations intermittently since yesterday but today started having more occurences and became diaphoretic and felt shortness of breathe. PT denies any chest pain and states has a pacemaker.

## 2019-08-25 ENCOUNTER — Telehealth: Payer: Self-pay

## 2019-08-25 NOTE — Telephone Encounter (Signed)
Remote transmission unremarkable.  Pt states he has had some "pulling" in his left shoulder when he grabbed the heavy bag, which he typically tries to avoid.  He has been somewhat sore with "tingling" in the middle of his chest today.  He has a history of bulging disks, and the sensation is similar, just in a different location.   Encouraged patient to follow up with PCP for likely MSK etiology.    Pt knows to call back if he has any further questions or concerns. He has a remote next week, so will continue to follow trends.   Legrand Como [address] [place], PA-C  08/25/2019 3:29 PM

## 2019-08-25 NOTE — Telephone Encounter (Signed)
Pt is sending a transmission because he has an odd feeling in the center of his chest. Pt also states he went to the pet store to get some dog food. The cashier handed him the heavy bag and he could feel some pulling sensation from it.  He states that he felt some spasm on the left side of his ppm. I told him to send a manual transmission and the nurse will review it. I told him someone will give him a call back after the transmission is reviewed. The best number to call the pt is [phone].

## 2019-09-04 ENCOUNTER — Ambulatory Visit (INDEPENDENT_AMBULATORY_CARE_PROVIDER_SITE_OTHER): Payer: Commercial Managed Care - PPO | Admitting: *Deleted

## 2019-09-04 DIAGNOSIS — I442 Atrioventricular block, complete: Secondary | ICD-10-CM

## 2019-09-04 LAB — CUP PACEART REMOTE DEVICE CHECK
Battery Remaining Longevity: 163 mo
Battery Voltage: 3.01 V
Brady Statistic AP VP Percent: 1.8 %
Brady Statistic AP VS Percent: 1.3 %
Brady Statistic AS VP Percent: 0.03 %
Brady Statistic AS VS Percent: 96.88 %
Brady Statistic RA Percent Paced: 3.1 %
Brady Statistic RV Percent Paced: 1.83 %
Date Time Interrogation Session: 20201008043048
Implantable Lead Implant Date: 20190123
Implantable Lead Implant Date: 20190123
Implantable Lead Location: 753859
Implantable Lead Location: 753860
Implantable Lead Model: 5076
Implantable Lead Model: 5076
Implantable Pulse Generator Implant Date: 20190123
Lead Channel Impedance Value: 304 Ohm
Lead Channel Impedance Value: 361 Ohm
Lead Channel Impedance Value: 437 Ohm
Lead Channel Impedance Value: 494 Ohm
Lead Channel Pacing Threshold Amplitude: 1 V
Lead Channel Pacing Threshold Amplitude: 1.625 V
Lead Channel Pacing Threshold Pulse Width: 0.4 ms
Lead Channel Pacing Threshold Pulse Width: 0.4 ms
Lead Channel Sensing Intrinsic Amplitude: 1.375 mV
Lead Channel Sensing Intrinsic Amplitude: 9.25 mV
Lead Channel Setting Pacing Amplitude: 2.25 V
Lead Channel Setting Pacing Amplitude: 3.25 V
Lead Channel Setting Pacing Pulse Width: 0.4 ms
Lead Channel Setting Sensing Sensitivity: 1.2 mV

## 2019-09-05 ENCOUNTER — Encounter: Payer: Self-pay | Admitting: Cardiology

## 2019-09-05 NOTE — Progress Notes (Signed)
Remote pacemaker transmission.   

## 2019-09-10 ENCOUNTER — Encounter (HOSPITAL_COMMUNITY): Payer: Self-pay | Admitting: Physical Therapy

## 2019-09-10 ENCOUNTER — Other Ambulatory Visit: Payer: Self-pay

## 2019-09-10 ENCOUNTER — Ambulatory Visit (HOSPITAL_COMMUNITY): Payer: Commercial Managed Care - PPO | Attending: Neurosurgery | Admitting: Physical Therapy

## 2019-09-10 DIAGNOSIS — M546 Pain in thoracic spine: Secondary | ICD-10-CM | POA: Diagnosis not present
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
## 2019-09-10 NOTE — Patient Instructions (Addendum)
Scapular Retraction: Abduction (Standing)    With arms elevated and elbows bent to 90, pinch shoulder blades together and press arms back. Repeat __10__ times per set. Do __1__ sets per session. Do _2___ sessions per day.  http://orth.exer.us/950   Copyright  VHI. All rights reserved.  On Elbows (Prone)    Rise up on elbows as high as possible, keeping hips on floor. Hold __60__ seconds. Repeat __2__ times per set. Do _1___ sets per session. Do 2____ sessions per day.  http://orth.exer.us/92   Copyright  VHI. All rights reserved.

## 2019-09-11 ENCOUNTER — Ambulatory Visit (HOSPITAL_COMMUNITY): Payer: Commercial Managed Care - PPO | Admitting: Physical Therapy

## 2019-09-11 ENCOUNTER — Encounter (HOSPITAL_COMMUNITY): Payer: Self-pay | Admitting: Physical Therapy

## 2019-09-11 DIAGNOSIS — M546 Pain in thoracic spine: Secondary | ICD-10-CM | POA: Diagnosis not present
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
## 2019-09-16 ENCOUNTER — Ambulatory Visit (HOSPITAL_COMMUNITY): Payer: Commercial Managed Care - PPO | Admitting: Physical Therapy

## 2019-09-16 ENCOUNTER — Encounter (HOSPITAL_COMMUNITY): Payer: Self-pay | Admitting: Physical Therapy

## 2019-09-16 ENCOUNTER — Other Ambulatory Visit: Payer: Self-pay

## 2019-09-16 DIAGNOSIS — M546 Pain in thoracic spine: Secondary | ICD-10-CM

## 2019-09-16 DIAGNOSIS — Z20822 Contact with and (suspected) exposure to covid-19: Secondary | ICD-10-CM
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
## 2019-09-17 LAB — NOVEL CORONAVIRUS, NAA: SARS-CoV-2, NAA: NOT DETECTED

## 2019-09-18 ENCOUNTER — Ambulatory Visit (HOSPITAL_COMMUNITY): Payer: Commercial Managed Care - PPO | Admitting: Physical Therapy

## 2019-09-18 ENCOUNTER — Encounter (HOSPITAL_COMMUNITY): Payer: Self-pay | Admitting: Physical Therapy

## 2019-09-18 ENCOUNTER — Other Ambulatory Visit: Payer: Self-pay

## 2019-09-18 ENCOUNTER — Encounter (HOSPITAL_COMMUNITY): Payer: Commercial Managed Care - PPO | Admitting: Physical Therapy

## 2019-09-18 DIAGNOSIS — M546 Pain in thoracic spine: Secondary | ICD-10-CM | POA: Diagnosis not present
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
## 2019-09-24 ENCOUNTER — Encounter (HOSPITAL_COMMUNITY): Payer: Commercial Managed Care - PPO | Admitting: Physical Therapy

## 2019-09-25 ENCOUNTER — Other Ambulatory Visit: Payer: Self-pay

## 2019-09-25 ENCOUNTER — Ambulatory Visit (HOSPITAL_COMMUNITY): Payer: Commercial Managed Care - PPO | Admitting: Physical Therapy

## 2019-09-25 ENCOUNTER — Encounter (HOSPITAL_COMMUNITY): Payer: Self-pay | Admitting: Physical Therapy

## 2019-09-25 ENCOUNTER — Encounter (HOSPITAL_COMMUNITY): Payer: Commercial Managed Care - PPO | Admitting: Physical Therapy

## 2019-09-25 DIAGNOSIS — M546 Pain in thoracic spine: Secondary | ICD-10-CM
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
## 2019-09-26 ENCOUNTER — Encounter (HOSPITAL_COMMUNITY): Payer: Commercial Managed Care - PPO

## 2019-09-30 ENCOUNTER — Other Ambulatory Visit: Payer: Self-pay

## 2019-09-30 ENCOUNTER — Encounter (HOSPITAL_COMMUNITY): Payer: Self-pay

## 2019-09-30 ENCOUNTER — Ambulatory Visit (HOSPITAL_COMMUNITY): Payer: Commercial Managed Care - PPO | Attending: Neurosurgery

## 2019-09-30 ENCOUNTER — Encounter (HOSPITAL_COMMUNITY): Payer: Commercial Managed Care - PPO

## 2019-09-30 DIAGNOSIS — M546 Pain in thoracic spine: Secondary | ICD-10-CM | POA: Diagnosis not present
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
## 2019-10-02 ENCOUNTER — Encounter: Payer: Self-pay | Admitting: *Deleted

## 2019-10-02 ENCOUNTER — Encounter: Payer: Self-pay | Admitting: Student

## 2019-10-02 ENCOUNTER — Ambulatory Visit (HOSPITAL_COMMUNITY): Payer: Commercial Managed Care - PPO

## 2019-10-02 ENCOUNTER — Telehealth (HOSPITAL_COMMUNITY): Payer: Self-pay

## 2019-10-02 ENCOUNTER — Encounter (HOSPITAL_COMMUNITY): Payer: Commercial Managed Care - PPO

## 2019-10-02 ENCOUNTER — Telehealth (HOSPITAL_COMMUNITY): Payer: Self-pay | Admitting: Physical Therapy

## 2019-10-02 ENCOUNTER — Telehealth (INDEPENDENT_AMBULATORY_CARE_PROVIDER_SITE_OTHER): Payer: Commercial Managed Care - PPO | Admitting: Student

## 2019-10-02 VITALS — BP 124/80 | HR 74 | Ht 73.5 in | Wt 255.0 lb

## 2019-10-02 DIAGNOSIS — R0609 Other forms of dyspnea: Secondary | ICD-10-CM

## 2019-10-02 DIAGNOSIS — R002 Palpitations: Secondary | ICD-10-CM

## 2019-10-02 DIAGNOSIS — E039 Hypothyroidism, unspecified: Secondary | ICD-10-CM

## 2019-10-02 DIAGNOSIS — I442 Atrioventricular block, complete: Secondary | ICD-10-CM

## 2019-10-02 DIAGNOSIS — R0600 Dyspnea, unspecified: Secondary | ICD-10-CM | POA: Diagnosis not present

## 2019-10-02 DIAGNOSIS — R072 Precordial pain: Secondary | ICD-10-CM

## 2019-10-02 NOTE — Patient Instructions (Signed)
Medication Instructions:  Your physician recommends that you continue on your current medications as directed. Please refer to the Current Medication list given to you today.  *If you need a refill on your cardiac medications before your next appointment, please call your pharmacy*  Lab Work: NONE   If you have labs (blood work) drawn today and your tests are completely normal, you will receive your results only by: Marland Kitchen MyChart Message (if you have MyChart) OR . A paper copy in the mail If you have any lab test that is abnormal or we need to change your treatment, we will call you to review the results.  Testing/Procedures: Your physician has requested that you have a lexiscan myoview. For further information please visit [email or web address]. Please follow instruction sheet, as given.   Follow-Up: At [facility], you and your health needs are our priority.  As part of our continuing mission to provide you with exceptional heart care, we have created designated Provider Care Teams.  These Care Teams include your primary Cardiologist (physician) and Advanced Practice Providers (APPs -  Physician Assistants and Nurse Practitioners) who all work together to provide you with the care you need, when you need it.  Your next appointment:   2 Months   The format for your next appointment:   In Person  Provider:   You may see Cristopher Peru, MD or one of the following Advanced Practice Providers on your designated Care Team:    Bernerd Pho, PA-C   Ermalinda Barrios, PA-C    Other Instructions Thank you for choosing [facility]!

## 2019-10-02 NOTE — Telephone Encounter (Signed)
Dr Armandina Gemma  wants pt on hold until after he has some other test done. Pt will call us back after those results come in
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
## 2019-10-02 NOTE — Telephone Encounter (Signed)
pt called to cancel today's appt due to he had not gotten any sleep due to pain

## 2019-10-05 ENCOUNTER — Emergency Department (HOSPITAL_COMMUNITY): Payer: Commercial Managed Care - PPO

## 2019-10-05 ENCOUNTER — Encounter (HOSPITAL_COMMUNITY): Payer: Self-pay | Admitting: Emergency Medicine

## 2019-10-05 ENCOUNTER — Emergency Department (HOSPITAL_COMMUNITY)
Admission: EM | Admit: 2019-10-05 | Discharge: 2019-10-05 | Disposition: A | Discharge status: Home / Self Care | Payer: Commercial Managed Care - PPO | Attending: Emergency Medicine | Admitting: Emergency Medicine

## 2019-10-05 ENCOUNTER — Telehealth: Payer: Self-pay | Admitting: Physician Assistant

## 2019-10-05 DIAGNOSIS — Z79899 Other long term (current) drug therapy: Secondary | ICD-10-CM | POA: Diagnosis not present

## 2019-10-05 DIAGNOSIS — M549 Dorsalgia, unspecified: Secondary | ICD-10-CM | POA: Insufficient documentation

## 2019-10-05 DIAGNOSIS — R0602 Shortness of breath: Secondary | ICD-10-CM | POA: Insufficient documentation

## 2019-10-05 DIAGNOSIS — Z7982 Long term (current) use of aspirin: Secondary | ICD-10-CM | POA: Diagnosis not present

## 2019-10-05 DIAGNOSIS — F17210 Nicotine dependence, cigarettes, uncomplicated: Secondary | ICD-10-CM | POA: Diagnosis not present

## 2019-10-05 DIAGNOSIS — E039 Hypothyroidism, unspecified: Secondary | ICD-10-CM | POA: Insufficient documentation

## 2019-10-05 DIAGNOSIS — R0789 Other chest pain: Secondary | ICD-10-CM | POA: Insufficient documentation

## 2019-10-05 DIAGNOSIS — R079 Chest pain, unspecified: Secondary | ICD-10-CM | POA: Diagnosis present

## 2019-10-05 DIAGNOSIS — Z950 Presence of cardiac pacemaker: Secondary | ICD-10-CM | POA: Insufficient documentation

## 2019-10-05 LAB — CBC
HCT: 45.4 % (ref 39.0–52.0)
Hemoglobin: 15.5 g/dL (ref 13.0–17.0)
MCH: 32.6 pg (ref 26.0–34.0)
MCHC: 34.1 g/dL (ref 30.0–36.0)
MCV: 95.4 fL (ref 80.0–100.0)
Platelets: 340 10*3/uL (ref 150–400)
RBC: 4.76 MIL/uL (ref 4.22–5.81)
RDW: 12.1 % (ref 11.5–15.5)
WBC: 8.5 10*3/uL (ref 4.0–10.5)
nRBC: 0 % (ref 0.0–0.2)

## 2019-10-05 LAB — BASIC METABOLIC PANEL
Anion gap: 12 (ref 5–15)
BUN: 8 mg/dL (ref 6–20)
CO2: 20 mmol/L — ABNORMAL LOW (ref 22–32)
Calcium: 9.5 mg/dL (ref 8.9–10.3)
Chloride: 106 mmol/L (ref 98–111)
Creatinine, Ser: 0.9 mg/dL (ref 0.61–1.24)
GFR calc Af Amer: >60 mL/min (ref >60)
GFR calc non Af Amer: >60 mL/min (ref >60)
Glucose, Bld: 102 mg/dL — ABNORMAL HIGH (ref 70–99)
Potassium: 3.8 mmol/L (ref 3.5–5.1)
Sodium: 138 mmol/L (ref 135–145)

## 2019-10-05 LAB — TROPONIN I (HIGH SENSITIVITY)
Troponin I (High Sensitivity): 11 ng/L (ref <18)
Troponin I (High Sensitivity): 7 ng/L (ref <18)

## 2019-10-05 LAB — D-DIMER, QUANTITATIVE: D-Dimer, Quant: 0.36 ug/mL-FEU (ref 0.00–0.50)

## 2019-10-05 MED ORDER — SODIUM CHLORIDE 0.9% FLUSH: 3.0000 mL | Freq: Once | INTRAVENOUS | Status: DC

## 2019-10-05 MED ORDER — ASPIRIN 81 MG PO CHEW
324.0000 mg | CHEWABLE_TABLET | Freq: Once | ORAL | Status: AC
  Administered 2019-10-05: 324 mg via ORAL
  Filled 2019-10-05: qty 4

## 2019-10-05 NOTE — ED Triage Notes (Signed)
Pt arrives to ED with c/o CP since Friday night. Pt states pain comes and goes. Pt does have pacemaker he had put in 2019 for pauses.
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
## 2019-10-05 NOTE — Telephone Encounter (Signed)
   The patient called the answering service after-hours today. Most recent tele-med visit reviewed. Saw [place] PA-C recently for worsening back pain with radiation into his chest as well as fatigue and DOE. History is somewhat challenging given that he's been managed for a bulging disc in his T-spine. He had plan for stress test later this week. His neurosurgeon referred him to PT which he states only made symptoms worse. Over this weekend he began to notice episodic chest pain in his left chest and left arm. It comes and goes for brief durations. This is somewhat different than it was before. He is concerned this could represent an evolving heart problem. He also struggles with whether to just brush his symptoms off as anxiety/MSK. I told him unfortunately chest pain is not well evaluated over the phone, especially stuttering CP in the context of the evaluation he's had recently. I advised he proceed to the ER for evaluation and not to drive himself. Also discussed EMS as an urgent option. He laments about financial stressor and his dogs at home but verbalizes understanding of this recommendation and plans to proceed. Will forward to [place] to make her aware.    Charlie Pitter, PA-C

## 2019-10-06 ENCOUNTER — Telehealth: Payer: Self-pay | Admitting: Student

## 2019-10-06 NOTE — Telephone Encounter (Signed)
Pateint wanted [place] made aware that he spent day in [facility] yesterday - Ran all kinds of test and told him to touch base with her    He is also scheduled for Lexiscan this coming Friday and wanted to know if he needs to take medicaitons

## 2019-10-06 NOTE — Telephone Encounter (Signed)
Spoke with pt. Advised him he does not have to hold any medications prior to test. He will not eat/drink  after midnight the night before.

## 2019-10-07 ENCOUNTER — Encounter (HOSPITAL_COMMUNITY): Payer: Commercial Managed Care - PPO | Admitting: Physical Therapy

## 2019-10-09 ENCOUNTER — Encounter (HOSPITAL_COMMUNITY): Payer: Commercial Managed Care - PPO | Admitting: Physical Therapy

## 2019-10-09 ENCOUNTER — Telehealth: Payer: Self-pay | Admitting: Internal Medicine

## 2019-10-09 NOTE — Telephone Encounter (Signed)
Left message for patient to call.   Chanetta Marshall, NP 10/09/2019 4:59 PM

## 2019-10-09 NOTE — Telephone Encounter (Signed)
Patient has question prior to getting a Lexi scan done tomorrow. Would like to speak to someone in the device clinic.

## 2019-10-10 ENCOUNTER — Encounter (HOSPITAL_COMMUNITY)
Admission: RE | Admit: 2019-10-10 | Discharge: 2019-10-10 | Disposition: A | Discharge status: Home / Self Care | Payer: Commercial Managed Care - PPO | Source: Ambulatory Visit | Attending: Student | Admitting: Student

## 2019-10-10 ENCOUNTER — Encounter (HOSPITAL_BASED_OUTPATIENT_CLINIC_OR_DEPARTMENT_OTHER)
Admission: RE | Admit: 2019-10-10 | Discharge: 2019-10-10 | Disposition: A | Discharge status: Home / Self Care | Payer: Commercial Managed Care - PPO | Source: Ambulatory Visit | Attending: Student | Admitting: Student

## 2019-10-10 ENCOUNTER — Encounter (HOSPITAL_COMMUNITY): Payer: Self-pay

## 2019-10-10 ENCOUNTER — Other Ambulatory Visit: Payer: Self-pay

## 2019-10-10 DIAGNOSIS — R072 Precordial pain: Secondary | ICD-10-CM

## 2019-10-10 HISTORY — DX: Essential (primary) hypertension: I10

## 2019-10-10 LAB — NM MYOCAR MULTI W/SPECT W/WALL MOTION / EF
LV dias vol: 124 mL (ref 62–150)
LV sys vol: 49 mL
Peak HR: 94 {beats}/min
RATE: 0.32
Rest HR: 66 {beats}/min
SDS: 0
SRS: 0
SSS: 0
TID: 1.16

## 2019-10-10 MED ORDER — TECHNETIUM TC 99M TETROFOSMIN IV KIT
10.0000 | PACK | Freq: Once | INTRAVENOUS | Status: AC | PRN
  Administered 2019-10-10: 10.9 via INTRAVENOUS

## 2019-10-10 MED ORDER — REGADENOSON 0.4 MG/5ML IV SOLN
INTRAVENOUS | Status: AC
  Administered 2019-10-10: 0.4 mg via INTRAVENOUS
  Filled 2019-10-10: qty 5

## 2019-10-10 MED ORDER — TECHNETIUM TC 99M TETROFOSMIN IV KIT
30.0000 | PACK | Freq: Once | INTRAVENOUS | Status: AC | PRN
  Administered 2019-10-10: 09:00:00 29.5 via INTRAVENOUS

## 2019-10-10 MED ORDER — SODIUM CHLORIDE FLUSH 0.9 % IV SOLN
INTRAVENOUS | Status: AC
  Administered 2019-10-10: 10 mL via INTRAVENOUS
  Filled 2019-10-10: qty 10

## 2019-10-13 NOTE — Telephone Encounter (Signed)
Lexiscan completed 10/10/19 per notes in chart. Encounter closed.

## 2019-10-14 ENCOUNTER — Encounter: Payer: Self-pay | Admitting: Student

## 2019-10-14 ENCOUNTER — Other Ambulatory Visit: Payer: Self-pay | Admitting: Student

## 2019-10-14 ENCOUNTER — Encounter (HOSPITAL_COMMUNITY): Payer: Commercial Managed Care - PPO | Admitting: Physical Therapy

## 2019-10-14 ENCOUNTER — Telehealth: Payer: Self-pay | Admitting: Student

## 2019-10-14 ENCOUNTER — Encounter (HOSPITAL_COMMUNITY): Payer: Commercial Managed Care - PPO

## 2019-10-14 ENCOUNTER — Other Ambulatory Visit: Payer: Self-pay

## 2019-10-14 DIAGNOSIS — Z20822 Contact with and (suspected) exposure to covid-19: Secondary | ICD-10-CM

## 2019-10-14 MED ORDER — NITROGLYCERIN 0.4 MG SL SUBL: 0.4000 mg | SUBLINGUAL_TABLET | SUBLINGUAL | 3 refills | Status: AC | PRN

## 2019-10-14 NOTE — Telephone Encounter (Signed)
    Called the patient to review his abnormal stress test results. Had reviewed with Dr. Lovena Le who was in agreement for cath. The patient understands that risks include but are not limited to stroke (1 in 1000), death (1 in 99), kidney failure [usually temporary] (1 in 500), bleeding (1 in 200), allergic reaction [possibly serious] (1 in 200). He just had a CBC and BMET on 10/05/2019 and actually underwent COVID testing this morning due to his dyspnea. Will schedule cath for 11/23 as COVID results will be available by then.  He is aware to quarantine in the interim and a work note will be provided.   Procedure scheduled on 10/20/2019 with Dr. Saunders Revel at 0900. Will need to arrive at 0700. Rx for SL NTG sent in local pharmacy. Patient aware to present to the ED if symptoms worsen in the interim.   Patient asks to be called once cath letter and work note are ready.   Signed, Erma Heritage, PA-C 10/14/2019, 12:54 PM Pager: [phone]

## 2019-10-15 ENCOUNTER — Encounter: Payer: Self-pay | Admitting: *Deleted

## 2019-10-15 NOTE — Telephone Encounter (Signed)
Pt notified letter is ready for pick up.

## 2019-10-16 ENCOUNTER — Telehealth: Payer: Self-pay | Admitting: *Deleted

## 2019-10-16 LAB — NOVEL CORONAVIRUS, NAA: SARS-CoV-2, NAA: NOT DETECTED

## 2019-10-16 NOTE — Telephone Encounter (Signed)
I will forward to Dr End for review.  

## 2019-10-16 NOTE — Telephone Encounter (Signed)
Okay to proceed with cath as planned as long as patient is able to maintain social distancing as outlined in note below.  Bill Bush, MD [facility] Pager: [phone]

## 2019-10-16 NOTE — Telephone Encounter (Addendum)
Pt contacted pre-catheterization scheduled at [facility] for: Monday October 20, 2019 9 AM Verified arrival time and place: [place] [facility]) at: 7 AM   No solid food after midnight prior to cath, clear liquids until 5 AM day of procedure. Contrast allergy: no   AM meds can be  taken pre-cath with sip of water including: ASA 81 mg   Confirmed patient has responsible adult to drive home post procedure and observe 24 hours after arriving home:yes  Currently, due to Covid-19 pandemic, only one support person will be allowed with patient. Must be the same support person for that patient's entire stay, will be screened and required to wear a mask. They will be asked to wait in the waiting room for the duration of the patient's stay.  Patients are required to wear a mask when they enter the hospital.     COVID-19 Pre-Screening Questions:  . In the past 7 to 10 days have you had a cough,  shortness of breath, headache, congestion, fever (100 or greater) body aches, chills, sore throat, or sudden loss of taste or sense of smell? Shortness of breath possible  brochitis-was given antibiotics by PCP starting 10/13/19 feeling better, denies fever . Have you been around anyone with known Covid 19? no . Have you been around anyone who is awaiting Covid 19 test results in the past 7 to 10 days? no . Have you been around anyone who has been exposed to Covid 19, or has mentioned symptoms of Covid 19 within the past 7 to 10 days? no    I reviewed procedure/mask/vistor instructions, Covid-19 screening questions with patient, he verbalized understanding, thanked me for call.  Pt states he had a shortness of breath, given antibiotic for possible bronchitis 10/13/19, pt is feeling better, denies fever. Pt did have Covid-19 test 10/14/19 (not detected), has been in quarantine since test and knows to continue to quarantine with no visitors until going to hospital for procedure  10/20/19.  Pt called back to say that he had to drop two  dogs at the vet  for boarding Sunday night, pt is the only one that can handle one of the dogs, so no one can take the dog for him. Pt has been advised when he takes dogs for boarding that he wear mask, social distance at least 6 feet, no contact drop off and take 15 minutes or less to complete dropping them off for boarding.

## 2019-10-17 ENCOUNTER — Ambulatory Visit: Payer: Commercial Managed Care - PPO | Admitting: Student

## 2019-10-20 ENCOUNTER — Encounter (HOSPITAL_COMMUNITY)
Admission: RE | Disposition: A | Discharge status: Home / Self Care | Payer: Self-pay | Source: Home / Self Care | Attending: Internal Medicine

## 2019-10-20 ENCOUNTER — Ambulatory Visit (HOSPITAL_COMMUNITY)
Admission: RE | Admit: 2019-10-20 | Discharge: 2019-10-20 | Disposition: A | Discharge status: Home / Self Care | Payer: Commercial Managed Care - PPO | Attending: Internal Medicine | Admitting: Internal Medicine

## 2019-10-20 DIAGNOSIS — R0609 Other forms of dyspnea: Secondary | ICD-10-CM | POA: Insufficient documentation

## 2019-10-20 DIAGNOSIS — R0789 Other chest pain: Secondary | ICD-10-CM | POA: Diagnosis not present

## 2019-10-20 DIAGNOSIS — Z880 Allergy status to penicillin: Secondary | ICD-10-CM | POA: Insufficient documentation

## 2019-10-20 DIAGNOSIS — Z950 Presence of cardiac pacemaker: Secondary | ICD-10-CM | POA: Insufficient documentation

## 2019-10-20 DIAGNOSIS — Z881 Allergy status to other antibiotic agents status: Secondary | ICD-10-CM | POA: Diagnosis not present

## 2019-10-20 DIAGNOSIS — Z7982 Long term (current) use of aspirin: Secondary | ICD-10-CM | POA: Diagnosis not present

## 2019-10-20 DIAGNOSIS — R002 Palpitations: Secondary | ICD-10-CM | POA: Diagnosis not present

## 2019-10-20 DIAGNOSIS — R079 Chest pain, unspecified: Secondary | ICD-10-CM | POA: Diagnosis present

## 2019-10-20 DIAGNOSIS — F17210 Nicotine dependence, cigarettes, uncomplicated: Secondary | ICD-10-CM | POA: Diagnosis not present

## 2019-10-20 DIAGNOSIS — E039 Hypothyroidism, unspecified: Secondary | ICD-10-CM | POA: Diagnosis not present

## 2019-10-20 DIAGNOSIS — E785 Hyperlipidemia, unspecified: Secondary | ICD-10-CM | POA: Insufficient documentation

## 2019-10-20 DIAGNOSIS — I2510 Atherosclerotic heart disease of native coronary artery without angina pectoris: Secondary | ICD-10-CM | POA: Insufficient documentation

## 2019-10-20 DIAGNOSIS — R9439 Abnormal result of other cardiovascular function study: Secondary | ICD-10-CM | POA: Diagnosis not present

## 2019-10-20 DIAGNOSIS — Z79890 Hormone replacement therapy (postmenopausal): Secondary | ICD-10-CM | POA: Insufficient documentation

## 2019-10-20 DIAGNOSIS — Z791 Long term (current) use of non-steroidal anti-inflammatories (NSAID): Secondary | ICD-10-CM | POA: Diagnosis not present

## 2019-10-20 DIAGNOSIS — F329 Major depressive disorder, single episode, unspecified: Secondary | ICD-10-CM | POA: Insufficient documentation

## 2019-10-20 DIAGNOSIS — I429 Cardiomyopathy, unspecified: Secondary | ICD-10-CM | POA: Insufficient documentation

## 2019-10-20 DIAGNOSIS — I442 Atrioventricular block, complete: Secondary | ICD-10-CM | POA: Insufficient documentation

## 2019-10-20 DIAGNOSIS — Z79899 Other long term (current) drug therapy: Secondary | ICD-10-CM | POA: Diagnosis not present

## 2019-10-20 HISTORY — PX: LEFT HEART CATH AND CORONARY ANGIOGRAPHY: CATH118249

## 2019-10-20 SURGERY — LEFT HEART CATH AND CORONARY ANGIOGRAPHY
Anesthesia: LOCAL

## 2019-10-20 MED ORDER — FENTANYL CITRATE (PF) 100 MCG/2ML IJ SOLN
INTRAMUSCULAR | Status: DC | PRN
  Administered 2019-10-20: 50 ug via INTRAVENOUS

## 2019-10-20 MED ORDER — IOHEXOL 350 MG/ML SOLN
INTRAVENOUS | Status: DC | PRN
  Administered 2019-10-20: 40 mL

## 2019-10-20 MED ORDER — SODIUM CHLORIDE 0.9 % WEIGHT BASED INFUSION
3.0000 mL/kg/h | INTRAVENOUS | Status: AC
  Administered 2019-10-20: 3 mL/kg/h via INTRAVENOUS

## 2019-10-20 MED ORDER — SODIUM CHLORIDE 0.9% FLUSH: 3.0000 mL | Freq: Two times a day (BID) | INTRAVENOUS | Status: DC

## 2019-10-20 MED ORDER — MIDAZOLAM HCL 2 MG/2ML IJ SOLN
INTRAMUSCULAR | Status: DC | PRN
  Administered 2019-10-20: 1 mg via INTRAVENOUS

## 2019-10-20 MED ORDER — SODIUM CHLORIDE 0.9 % WEIGHT BASED INFUSION: 1.0000 mL/kg/h | INTRAVENOUS | Status: DC

## 2019-10-20 MED ORDER — MIDAZOLAM HCL 2 MG/2ML IJ SOLN
INTRAMUSCULAR | Status: AC
  Filled 2019-10-20: qty 2

## 2019-10-20 MED ORDER — HYDRALAZINE HCL 20 MG/ML IJ SOLN: 10.0000 mg | INTRAMUSCULAR | Status: DC | PRN

## 2019-10-20 MED ORDER — HEPARIN (PORCINE) IN NACL 1000-0.9 UT/500ML-% IV SOLN
INTRAVENOUS | Status: AC
  Filled 2019-10-20: qty 1000

## 2019-10-20 MED ORDER — VERAPAMIL HCL 2.5 MG/ML IV SOLN
INTRAVENOUS | Status: DC | PRN
  Administered 2019-10-20: 10 mL via INTRA_ARTERIAL

## 2019-10-20 MED ORDER — LIDOCAINE HCL (PF) 1 % IJ SOLN
INTRAMUSCULAR | Status: DC | PRN
  Administered 2019-10-20: 2 mL

## 2019-10-20 MED ORDER — VERAPAMIL HCL 2.5 MG/ML IV SOLN
INTRAVENOUS | Status: AC
  Filled 2019-10-20: qty 2

## 2019-10-20 MED ORDER — HEPARIN SODIUM (PORCINE) 1000 UNIT/ML IJ SOLN
INTRAMUSCULAR | Status: DC | PRN
  Administered 2019-10-20: 5000 [IU] via INTRAVENOUS

## 2019-10-20 MED ORDER — ATORVASTATIN CALCIUM 20 MG PO TABS: 20.0000 mg | ORAL_TABLET | Freq: Every day | ORAL | 11 refills | Status: DC

## 2019-10-20 MED ORDER — LIDOCAINE HCL (PF) 1 % IJ SOLN
INTRAMUSCULAR | Status: AC
  Filled 2019-10-20: qty 30

## 2019-10-20 MED ORDER — LABETALOL HCL 5 MG/ML IV SOLN: 10.0000 mg | INTRAVENOUS | Status: DC | PRN

## 2019-10-20 MED ORDER — SODIUM CHLORIDE 0.9 % IV SOLN: 250.0000 mL | INTRAVENOUS | Status: DC | PRN

## 2019-10-20 MED ORDER — FENTANYL CITRATE (PF) 100 MCG/2ML IJ SOLN
INTRAMUSCULAR | Status: AC
  Filled 2019-10-20: qty 2

## 2019-10-20 MED ORDER — SODIUM CHLORIDE 0.9% FLUSH: 3.0000 mL | INTRAVENOUS | Status: DC | PRN

## 2019-10-20 MED ORDER — ASPIRIN 81 MG PO CHEW: 81.0000 mg | CHEWABLE_TABLET | ORAL | Status: DC

## 2019-10-20 MED ORDER — ONDANSETRON HCL 4 MG/2ML IJ SOLN: 4.0000 mg | Freq: Four times a day (QID) | INTRAMUSCULAR | Status: DC | PRN

## 2019-10-20 MED ORDER — ACETAMINOPHEN 325 MG PO TABS: 650.0000 mg | ORAL_TABLET | ORAL | Status: DC | PRN

## 2019-10-20 MED ORDER — ISOSORBIDE MONONITRATE ER 30 MG PO TB24: 15.0000 mg | ORAL_TABLET | Freq: Every day | ORAL | 5 refills | Status: DC

## 2019-10-20 MED ORDER — HEPARIN SODIUM (PORCINE) 1000 UNIT/ML IJ SOLN
INTRAMUSCULAR | Status: AC
  Filled 2019-10-20: qty 1

## 2019-10-20 MED ORDER — HEPARIN (PORCINE) IN NACL 1000-0.9 UT/500ML-% IV SOLN
INTRAVENOUS | Status: DC | PRN
  Administered 2019-10-20 (×2): 500 mL

## 2019-10-20 MED ORDER — SODIUM CHLORIDE 0.9 % IV SOLN: INTRAVENOUS | Status: DC

## 2019-10-20 MED ORDER — FENTANYL CITRATE (PF) 100 MCG/2ML IJ SOLN
INTRAMUSCULAR | Status: DC | PRN
  Administered 2019-10-20: 25 ug via INTRAVENOUS

## 2019-10-20 SURGICAL SUPPLY — 9 items

## 2019-10-20 NOTE — Brief Op Note (Signed)
BRIEF CARDIAC CATHETERIZATION  10/20/2019  10:35 AM  PATIENT:  Bill Castro  58 y.o. male  PRE-OPERATIVE DIAGNOSIS:  Atypical chest pain and abnormal stress test  POST-OPERATIVE DIAGNOSIS:  Same  PROCEDURE:  Procedure(s): LEFT HEART CATH AND CORONARY ANGIOGRAPHY (N/A)  SURGEON:  Surgeon(s) and Role:    Nelva Bush, MD - Primary  FINDINGS: 1. Two vessel coronary artery disease with long 60% mid LAD stenosis and 80-90% stenosis involving distal RPDA. 2. Normal LVEF and LVEDP.  RECOMMENDATIONS: 1. Optimize medical therapy.  We will add isosorbide mononitrate 15 mg daily. 2. Secondary prevention with indefinite ASA 81 mg daily.  We will also start atorvastatin 20 mg daily for target LDL < 70. 3. If the patient has refractory angina despite maximal tolerated doses of two antianginal agents, PCI to the mid LAD could be considered.  Nelva Bush, MD [facility] Pager: [phone]

## 2019-10-20 NOTE — Interval H&P Note (Signed)
History and Physical Interval Note:  10/20/2019 9:55 AM  Bill Castro  has presented today for cardiac catheterization, with the diagnosis of chest pain and abnormal stress test.  The various methods of treatment have been discussed with the patient and family. After consideration of risks, benefits and other options for treatment, the patient has consented to  Procedure(s): LEFT HEART CATH AND CORONARY ANGIOGRAPHY (N/A) as a surgical intervention.  The patient's history has been reviewed, patient examined, no change in status, stable for surgery.  I have reviewed the patient's chart and labs.  Questions were answered to the patient's satisfaction.    Cath Lab Visit (complete for each Cath Lab visit)  Clinical Evaluation Leading to the Procedure:   ACS: No.  Non-ACS:    Anginal Classification: CCS IV  Anti-ischemic medical therapy: Minimal Therapy (1 class of medications)  Non-Invasive Test Results: Intermediate-risk stress test findings: cardiac mortality 1-3%/year  Prior CABG: No previous CABG  Bill Castro

## 2019-10-20 NOTE — Discharge Instructions (Signed)
Radial Site Care ° °This sheet gives you information about how to care for yourself after your procedure. Your health care provider may also give you more specific instructions. If you have problems or questions, contact your health care provider. °What can I expect after the procedure? °After the procedure, it is common to have: °· Bruising and tenderness at the catheter insertion area. °Follow these instructions at home: °Medicines °· Take over-the-counter and prescription medicines only as told by your health care provider. °Insertion site care °· Follow instructions from your health care provider about how to take care of your insertion site. Make sure you: °? Wash your hands with soap and water before you change your bandage (dressing). If soap and water are not available, use hand sanitizer. °? Change your dressing as told by your health care provider. °? Leave stitches (sutures), skin glue, or adhesive strips in place. These skin closures may need to stay in place for 2 weeks or longer. If adhesive strip edges start to loosen and curl up, you may trim the loose edges. Do not remove adhesive strips completely unless your health care provider tells you to do that. °· Check your insertion site every day for signs of infection. Check for: °? Redness, swelling, or pain. °? Fluid or blood. °? Pus or a bad smell. °? Warmth. °· Do not take baths, swim, or use a hot tub until your health care provider approves. °· You may shower 24-48 hours after the procedure, or as directed by your health care provider. °? Remove the dressing and gently wash the site with plain soap and water. °? Pat the area dry with a clean towel. °? Do not rub the site. That could cause bleeding. °· Do not apply powder or lotion to the site. °Activity ° °· For 24 hours after the procedure, or as directed by your health care provider: °? Do not flex or bend the affected arm. °? Do not push or pull heavy objects with the affected arm. °? Do not  drive yourself home from the hospital or clinic. You may drive 24 hours after the procedure unless your health care provider tells you not to. °? Do not operate machinery or power tools. °· Do not lift anything that is heavier than 10 lb (4.5 kg), or the limit that you are told, until your health care provider says that it is safe. °· Ask your health care provider when it is okay to: °? Return to work or school. °? Resume usual physical activities or sports. °? Resume sexual activity. °General instructions °· If the catheter site starts to bleed, raise your arm and put firm pressure on the site. If the bleeding does not stop, get help right away. This is a medical emergency. °· If you went home on the same day as your procedure, a responsible adult should be with you for the first 24 hours after you arrive home. °· Keep all follow-up visits as told by your health care provider. This is important. °Contact a health care provider if: °· You have a fever. °· You have redness, swelling, or yellow drainage around your insertion site. °Get help right away if: °· You have unusual pain at the radial site. °· The catheter insertion area swells very fast. °· The insertion area is bleeding, and the bleeding does not stop when you hold steady pressure on the area. °· Your arm or hand becomes pale, cool, tingly, or numb. °These symptoms may represent a serious problem   that is an emergency. Do not wait to see if the symptoms will go away. Get medical help right away. Call your local emergency services (911 in the [place] Do not drive yourself to the hospital. °Summary °· After the procedure, it is common to have bruising and tenderness at the site. °· Follow instructions from your health care provider about how to take care of your radial site wound. Check the wound every day for signs of infection. °· Do not lift anything that is heavier than 10 lb (4.5 kg), or the limit that you are told, until your health care provider says  that it is safe. °This information is not intended to replace advice given to you by your health care provider. Make sure you discuss any questions you have with your health care provider. °Document Released: 12/16/2010 Document Revised: 12/19/2017 Document Reviewed: 12/19/2017 °Elsevier Patient Education © 2020 [employer] ° °

## 2019-10-21 ENCOUNTER — Encounter (HOSPITAL_COMMUNITY): Payer: Self-pay | Admitting: Internal Medicine

## 2019-10-31 ENCOUNTER — Encounter: Payer: Self-pay | Admitting: Student

## 2019-10-31 ENCOUNTER — Other Ambulatory Visit: Payer: Self-pay

## 2019-10-31 ENCOUNTER — Ambulatory Visit (INDEPENDENT_AMBULATORY_CARE_PROVIDER_SITE_OTHER): Payer: Commercial Managed Care - PPO | Admitting: Student

## 2019-10-31 VITALS — BP 132/76 | HR 83 | Temp 96.8°F | Ht 73.0 in | Wt 253.0 lb

## 2019-10-31 DIAGNOSIS — I442 Atrioventricular block, complete: Secondary | ICD-10-CM

## 2019-10-31 DIAGNOSIS — Z87891 Personal history of nicotine dependence: Secondary | ICD-10-CM

## 2019-10-31 DIAGNOSIS — R0600 Dyspnea, unspecified: Secondary | ICD-10-CM

## 2019-10-31 DIAGNOSIS — R002 Palpitations: Secondary | ICD-10-CM

## 2019-10-31 DIAGNOSIS — Z8679 Personal history of other diseases of the circulatory system: Secondary | ICD-10-CM | POA: Diagnosis not present

## 2019-10-31 DIAGNOSIS — E785 Hyperlipidemia, unspecified: Secondary | ICD-10-CM

## 2019-10-31 DIAGNOSIS — I25118 Atherosclerotic heart disease of native coronary artery with other forms of angina pectoris: Secondary | ICD-10-CM

## 2019-10-31 DIAGNOSIS — R0609 Other forms of dyspnea: Secondary | ICD-10-CM
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
## 2019-10-31 NOTE — Patient Instructions (Signed)
Medication Instructions:  Your physician recommends that you continue on your current medications as directed. Please refer to the Current Medication list given to you today.  *If you need a refill on your cardiac medications before your next appointment, please call your pharmacy*  Lab Work: Your physician recommends that you return for lab work in: 6-8 Weeks   If you have labs (blood work) drawn today and your tests are completely normal, you will receive your results only by: Marland Kitchen MyChart Message (if you have MyChart) OR . A paper copy in the mail If you have any lab test that is abnormal or we need to change your treatment, we will call you to review the results.  Testing/Procedures: NONE    Follow-Up: At [facility], you and your health needs are our priority.  As part of our continuing mission to provide you with exceptional heart care, we have created designated Provider Care Teams.  These Care Teams include your primary Cardiologist (physician) and Advanced Practice Providers (APPs -  Physician Assistants and Nurse Practitioners) who all work together to provide you with the care you need, when you need it.  Your next appointment:   2-3 month(s)  The format for your next appointment:   In Person  Provider:   Carlyle Dolly, MD  Other Instructions Thank you for choosing [facility]!

## 2019-11-02 ENCOUNTER — Other Ambulatory Visit: Payer: Self-pay | Admitting: Internal Medicine

## 2019-11-18 ENCOUNTER — Ambulatory Visit (INDEPENDENT_AMBULATORY_CARE_PROVIDER_SITE_OTHER): Payer: Commercial Managed Care - PPO | Admitting: Internal Medicine

## 2019-11-18 ENCOUNTER — Encounter: Payer: Self-pay | Admitting: Internal Medicine

## 2019-11-18 ENCOUNTER — Other Ambulatory Visit: Payer: Self-pay

## 2019-11-18 DIAGNOSIS — R0600 Dyspnea, unspecified: Secondary | ICD-10-CM | POA: Diagnosis not present

## 2019-11-18 DIAGNOSIS — R0609 Other forms of dyspnea: Secondary | ICD-10-CM

## 2019-11-18 NOTE — Telephone Encounter (Signed)
Dr Melvyn Novas, please view the e-mail from patient with additional information from today's visit.  Thank you.

## 2019-11-18 NOTE — Patient Instructions (Signed)
Submaximal exercise is the best way to stay fit and reduce your anxiety   Please schedule a follow up visit in 6  months but call sooner if needed with pfts on return

## 2019-11-18 NOTE — Progress Notes (Signed)
Subjective:     Patient ID: Bill Castro, male   DOB: 09/25/1961,   MRN: [id number]  HPI [age] yow[age] quit smoking 09/2019 with background of seasonal rhinitis spring > fall better as got older and otcs then in June 2020 yardwork > felt a pop in back > one month after this paroxysms of gen chest and back pain sitting > activity lasting from sev minutes to an hour with sob >  Bill Castro eval rec shots has not started >  Then started having different of L shoulder pain also not necessarily related to activity.  Variably sob since then so referred to pulmonary clinic 11/18/2019 by Bill Castro  Prior hx of strangling / globus >  Bill Castro neg    LHC  10/20/2019 Conclusions: 1. Two vessel coronary artery disease with long 60% mid/distal LAD stenosis and 80-90% stenosis involving distal rPDA. 2. Normal left ventricular systolic function and filling pressure.  Recommendations: 1. Optimize medical therapy. We will add isosorbide mononitrate 15 mg daily. 2. Secondary prevention with indefinite ASA 81 mg daily. We will also start atorvastatin 20 mg daily for target LDL <70. 3. If the patient has refractory angina despite maximal tolerated doses of two antianginal agents, PCI to the mid LAD could be considered.   11/18/2019  First  ov/Bill Castro re: sob /cp  Chief Complaint  Patient presents with  . Consult    Shortness of breath for the last several months.    Dyspnea:  15 - one minute resolves on its own does ok x several flights of steps / no aerobiics Cough: none  Sleeping: flat/ one pillow SABA use: none  02: none    No obvious day to day or daytime variability or assoc excess/ purulent sputum or mucus plugs or hemoptysis or cp or chest tightness, subjective wheeze or overt sinus or hb symptoms.   Sleeping  Ok without nocturnal  or early am exacerbation  of respiratory  c/o's or need for noct saba. Also denies any obvious fluctuation of symptoms with weather or environmental changes or other  aggravating or alleviating factors except as outlined above   No unusual exposure hx or h/o childhood pna/ asthma or knowledge of premature birth.  Current Allergies, Complete Past Medical History, Past Surgical History, Family History, and Social History were reviewed in [facility] electronic medical record.          Current Meds  Medication Sig  . acetaminophen (TYLENOL) 500 MG tablet Take 1,000 mg by mouth every 6 (six) hours as needed (back pain).   Marland Kitchen. ALPRAZolam (XANAX) 1 MG tablet Take 1 tablet by mouth See admin instructions. Take one tablet (1 mg) by mouth daily at bedtime, may also take one tablet (1 mg) during the day as needed for anxiety  . aspirin EC 81 MG tablet Take 81 mg by mouth daily with lunch.   Marland Kitchen. atorvastatin (LIPITOR) 20 MG tablet Take 1 tablet (20 mg total) by mouth daily.  . isosorbide mononitrate (IMDUR) 30 MG 24 hr tablet Take 0.5 tablets (15 mg total) by mouth daily.  Marland Kitchen. levothyroxine (SYNTHROID) 100 MCG tablet Take 100 mcg by mouth daily with lunch.   . meloxicam (MOBIC) 15 MG tablet Take 15 mg by mouth daily with lunch.   . metoprolol succinate (TOPROL-XL) 50 MG 24 hr tablet TAKE 1 TABLET BY MOUTH DAILY, TAKE WITH OR IMMEDIATELY FOLLOWING A MEAL  . nitroGLYCERIN (NITROSTAT) 0.4 MG SL tablet Place 1 tablet (0.4 mg total) under the tongue  every 5 (five) minutes as needed for chest pain.       Review of Systems  Constitutional: Negative.   HENT: Positive for rhinorrhea, sinus pressure and voice change.   Eyes: Negative.   Respiratory: Positive for shortness of breath.   Cardiovascular: Positive for palpitations.  Gastrointestinal: Negative.   Endocrine: Negative.   Genitourinary: Negative.   Musculoskeletal: Positive for back pain.  Skin: Negative.   Allergic/Immunologic: Negative.   Neurological: Negative.   Hematological: Negative.   Psychiatric/Behavioral: Negative.        Objective:   Physical Exam    amb somber wm nad   Wt Readings from  Last 3 Encounters:  11/18/19 255 lb 6.4 oz (115.8 kg)  10/31/19 253 lb (114.8 kg)  10/20/19 243 lb (110.2 kg)     Vital signs reviewed - Note on arrival 02 sats  99% on RA    HEENT : pt wearing mask not removed for exam due to covid -19 concerns.    NECK :  without JVD/Nodes/TM/ nl carotid upstrokes bilaterally   LUNGS: no acc muscle use,  Nl contour chest which is clear to A and P bilaterally without cough on insp or exp maneuvers   CV:  RRR  no s3 or murmur or increase in P2, and no edema   ABD:  soft and nontender with nl inspiratory excursion in the supine position. No bruits or organomegaly appreciated, bowel sounds nl  MS:  Nl gait/ ext warm without deformities, calf tenderness, cyanosis or clubbing No obvious joint restrictions   SKIN: warm and dry without lesions    NEURO:  alert, approp, nl sensorium with  no motor or cerebellar deficits apparent.          CXR Castro and Lateral:   11/18/2019 :    I personally reviewed images and agree with radiology impression as follows:    No acute cardiopulmonary disease.   Labs   reviewed:      Chemistry      Component Value Date/Time   NA 138 10/05/2019 1410   K 3.8 10/05/2019 1410   CL 106 10/05/2019 1410   CO2 20 (L) 10/05/2019 1410   BUN 8 10/05/2019 1410   CREATININE 0.90 10/05/2019 1410      Component Value Date/Time   CALCIUM 9.5 10/05/2019 1410   ALKPHOS 59 08/04/2019 1744   AST 18 08/04/2019 1744   ALT 23 08/04/2019 1744   BILITOT 0.7 08/04/2019 1744        Lab Results  Component Value Date   WBC 8.5 10/05/2019   HGB 15.5 10/05/2019   HCT 45.4 10/05/2019   MCV 95.4 10/05/2019   PLT 340 10/05/2019       EOS                                                               0.3                                    08/04/2019  Lab Results  Component Value Date   DDIMER 0.36 10/05/2019      Lab Results  Component Value Date   TSH 3.883 12/19/2017  Assessment:

## 2019-11-19 ENCOUNTER — Encounter: Payer: Self-pay | Admitting: Internal Medicine

## 2019-11-19 DIAGNOSIS — R0609 Other forms of dyspnea: Secondary | ICD-10-CM | POA: Insufficient documentation

## 2019-11-19 DIAGNOSIS — R0600 Dyspnea, unspecified: Secondary | ICD-10-CM | POA: Insufficient documentation

## 2019-11-19 NOTE — Assessment & Plan Note (Addendum)
Onset summer 2020 assoc with atypical paroxysms of two different cp patterns neither brought on with ex [facility]  10/20/2019 Conclusions: 1. Two vessel coronary artery disease with long 60% mid/distal LAD stenosis and 80-90% stenosis involving distal rPDA. 2. Normal left ventricular systolic function and filling pressure.  Recommendations: 1. Optimize medical therapy. We will add isosorbide mononitrate 15 mg daily. 2. Secondary prevention with indefinite ASA 81 mg daily. We will also start atorvastatin 20 mg daily for target LDL <70. 3. If the patient has refractory angina despite maximal tolerated doses of two antianginal agents, PCI to the mid LAD could be considered.   11/18/2019   Walked RA x3 laps =  approx 750 ft @ fast pace - stopped due to and of study with sats of 98% at the end of the study s cp or sob   Sob p 6 months of paroxysms of cp and sob at rest that resolve spontaneously and are not reproduced with exertion do not suggest angina nor any pulmonary disorder other than hyperventilation/ panic disorder which would explain the low bicarb level at his last acute eval 10/05/2019. If this were a chronic met acidosis we should have seen doe, not intermittent resting sob, as a chief concern.   Reviewed lung mechanics and control of breathing using rubber band analogy and emphasizing better sleep hygiene and submaximal ex as a way to deal with it s resorting to potential for habit forming use of benzo's which would be my last resort but prefer these be dosed by PCP.    Ideally since he smoked he needs PFTs 6 m p quit  and could be considered for LowDose Screening CT at that time as well/ advised but neither needed during covid 19 pandemic so ok to wait until June 2020 for this - he knows to call sooner if symptoms change for the worse.   Total time devoted to counseling  > 50 % of initial 60 min office visit:  reviewed case with pt/ directly observed portions of ambulatory 02 saturation  study/  discussion of options/alternatives/ personally creating written customized instructions  in presence of pt  then going over those specific  Instructions directly with the pt including how to use all of the meds but in particular covering each new medication in detail and the difference between the maintenance= "automatic" meds and the prns using an action plan format for the latter (If this problem/symptom => do that organization reading Left to right).  Please see AVS from this visit for a full list of these instructions which I personally wrote for this pt and  are unique to this visit.

## 2019-12-02 ENCOUNTER — Ambulatory Visit: Payer: Commercial Managed Care - PPO | Admitting: Student

## 2019-12-04 ENCOUNTER — Ambulatory Visit (INDEPENDENT_AMBULATORY_CARE_PROVIDER_SITE_OTHER): Payer: Commercial Managed Care - PPO | Admitting: *Deleted

## 2019-12-04 DIAGNOSIS — I442 Atrioventricular block, complete: Secondary | ICD-10-CM | POA: Diagnosis not present

## 2019-12-04 LAB — CUP PACEART REMOTE DEVICE CHECK
Battery Remaining Longevity: 161 mo
Battery Voltage: 3.01 V
Brady Statistic AP VP Percent: 2.38 %
Brady Statistic AP VS Percent: 2.74 %
Brady Statistic AS VP Percent: 0.04 %
Brady Statistic AS VS Percent: 94.85 %
Brady Statistic RA Percent Paced: 5.17 %
Brady Statistic RV Percent Paced: 2.41 %
Date Time Interrogation Session: 20210106233302
Implantable Lead Implant Date: 20190123
Implantable Lead Implant Date: 20190123
Implantable Lead Location: 753859
Implantable Lead Location: 753860
Implantable Lead Model: 5076
Implantable Lead Model: 5076
Implantable Pulse Generator Implant Date: 20190123
Lead Channel Impedance Value: 342 Ohm
Lead Channel Impedance Value: 399 Ohm
Lead Channel Impedance Value: 418 Ohm
Lead Channel Impedance Value: 456 Ohm
Lead Channel Pacing Threshold Amplitude: 0.875 V
Lead Channel Pacing Threshold Amplitude: 1.25 V
Lead Channel Pacing Threshold Pulse Width: 0.4 ms
Lead Channel Pacing Threshold Pulse Width: 0.4 ms
Lead Channel Sensing Intrinsic Amplitude: 1.625 mV
Lead Channel Sensing Intrinsic Amplitude: 1.625 mV
Lead Channel Sensing Intrinsic Amplitude: 9.25 mV
Lead Channel Sensing Intrinsic Amplitude: 9.25 mV
Lead Channel Setting Pacing Amplitude: 2 V
Lead Channel Setting Pacing Amplitude: 2.5 V
Lead Channel Setting Pacing Pulse Width: 0.4 ms
Lead Channel Setting Sensing Sensitivity: 1.2 mV

## 2019-12-16 ENCOUNTER — Telehealth: Payer: Self-pay

## 2019-12-16 NOTE — Telephone Encounter (Signed)
Pt states he was standing near a high power transmitter at work and wanted to know if his ppm was okay. Pt sent a transmission. Irving Burton reviewed the transmission and let me know that everything looks good. The pt verbalized understanding and thanked me for all my help.

## 2020-01-20 ENCOUNTER — Ambulatory Visit: Payer: Commercial Managed Care - PPO | Admitting: Cardiology

## 2020-01-25 ENCOUNTER — Other Ambulatory Visit: Payer: Self-pay

## 2020-01-25 ENCOUNTER — Encounter: Payer: Self-pay | Admitting: Emergency Medicine

## 2020-01-25 ENCOUNTER — Ambulatory Visit
Admission: EM | Admit: 2020-01-25 | Discharge: 2020-01-25 | Disposition: A | Discharge status: Home / Self Care | Payer: Commercial Managed Care - PPO

## 2020-01-25 DIAGNOSIS — T161XXA Foreign body in right ear, initial encounter: Secondary | ICD-10-CM

## 2020-01-25 DIAGNOSIS — H9202 Otalgia, left ear: Secondary | ICD-10-CM

## 2020-01-25 DIAGNOSIS — T162XXA Foreign body in left ear, initial encounter: Secondary | ICD-10-CM

## 2020-01-25 MED ORDER — METHYLPREDNISOLONE SODIUM SUCC 125 MG IJ SOLR
80.0000 mg | Freq: Once | INTRAMUSCULAR | Status: AC
  Administered 2020-01-25: 80 mg via INTRAMUSCULAR

## 2020-01-25 MED ORDER — TRIAMCINOLONE ACETONIDE 55 MCG/ACT NA AERO: 2.0000 | INHALATION_SPRAY | Freq: Every day | NASAL | 12 refills | Status: DC

## 2020-01-25 MED ORDER — MONTELUKAST SODIUM 10 MG PO TABS: 10.0000 mg | ORAL_TABLET | Freq: Every day | ORAL | 0 refills | Status: DC

## 2020-01-25 MED ORDER — NEOMYCIN-POLYMYXIN-HC 3.5-10000-1 OT SOLN: 3.0000 [drp] | Freq: Four times a day (QID) | OTIC | 0 refills | Status: DC
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
## 2020-01-25 NOTE — Discharge Instructions (Addendum)
Start nasacort for nasal congestion/drainage. You can use over the counter nasal saline rinse such as neti pot for nasal congestion. Keep hydrated, your urine should be clear to pale yellow in color. Tylenol/motrin for fever and pain. Monitor for any worsening of symptoms, chest pain, shortness of breath, wheezing, swelling of the throat, go to the emergency department for further evaluation needed.

## 2020-01-25 NOTE — ED Triage Notes (Signed)
Pt presents to [facility] for assessment of left ear pain x 2 days.  C/o headache on same side.  Patient also c/o sinus drainage "for months" and multiple COVID tests since those symptoms started.   Patient states he has tried Flonase in the past for his symptoms without relief.

## 2020-01-30 ENCOUNTER — Ambulatory Visit (INDEPENDENT_AMBULATORY_CARE_PROVIDER_SITE_OTHER): Payer: Commercial Managed Care - PPO

## 2020-01-30 VITALS — HR 89 | Temp 97.0°F | Resp 16 | Ht 73.0 in | Wt 255.0 lb

## 2020-01-30 DIAGNOSIS — R079 Chest pain, unspecified: Secondary | ICD-10-CM | POA: Diagnosis not present

## 2020-01-30 NOTE — Progress Notes (Addendum)
EKG obtained, NSR, no acute changes, sent to B.Strader, PA-C        EKG reviewed by B.Strader, PA-C, called patient to relay EKG is normal, left message on cell

## 2020-01-30 NOTE — Patient Instructions (Signed)
NSR, normal EKG

## 2020-03-03 ENCOUNTER — Other Ambulatory Visit (HOSPITAL_COMMUNITY)
Admission: RE | Admit: 2020-03-03 | Discharge: 2020-03-03 | Disposition: A | Discharge status: Home / Self Care | Payer: Commercial Managed Care - PPO | Source: Ambulatory Visit | Attending: Student | Admitting: Student

## 2020-03-03 ENCOUNTER — Other Ambulatory Visit: Payer: Self-pay

## 2020-03-03 DIAGNOSIS — I25118 Atherosclerotic heart disease of native coronary artery with other forms of angina pectoris: Secondary | ICD-10-CM | POA: Diagnosis not present

## 2020-03-03 LAB — LIPID PANEL
Cholesterol: 146 mg/dL (ref 0–200)
HDL: 44 mg/dL (ref >40)
LDL Cholesterol: 80 mg/dL (ref 0–99)
Total CHOL/HDL Ratio: 3.3 RATIO
Triglycerides: 112 mg/dL (ref <150)
VLDL: 22 mg/dL (ref 0–40)

## 2020-03-03 LAB — HEPATIC FUNCTION PANEL
ALT: 30 U/L (ref 0–44)
AST: 24 U/L (ref 15–41)
Albumin: 4.2 g/dL (ref 3.5–5.0)
Alkaline Phosphatase: 53 U/L (ref 38–126)
Bilirubin, Direct: <0.1 mg/dL (ref 0.0–0.2)
Total Bilirubin: 0.5 mg/dL (ref 0.3–1.2)
Total Protein: 6.9 g/dL (ref 6.5–8.1)

## 2020-03-04 ENCOUNTER — Ambulatory Visit (INDEPENDENT_AMBULATORY_CARE_PROVIDER_SITE_OTHER): Payer: Commercial Managed Care - PPO | Admitting: *Deleted

## 2020-03-04 DIAGNOSIS — I442 Atrioventricular block, complete: Secondary | ICD-10-CM

## 2020-03-04 LAB — CUP PACEART REMOTE DEVICE CHECK
Battery Remaining Longevity: 158 mo
Battery Voltage: 3 V
Brady Statistic AP VP Percent: 1.81 %
Brady Statistic AP VS Percent: 2.32 %
Brady Statistic AS VP Percent: 0.03 %
Brady Statistic AS VS Percent: 95.84 %
Brady Statistic RA Percent Paced: 4.19 %
Brady Statistic RV Percent Paced: 1.84 %
Date Time Interrogation Session: 20210408003509
Implantable Lead Implant Date: 20190123
Implantable Lead Implant Date: 20190123
Implantable Lead Location: 753859
Implantable Lead Location: 753860
Implantable Lead Model: 5076
Implantable Lead Model: 5076
Implantable Pulse Generator Implant Date: 20190123
Lead Channel Impedance Value: 304 Ohm
Lead Channel Impedance Value: 361 Ohm
Lead Channel Impedance Value: 361 Ohm
Lead Channel Impedance Value: 399 Ohm
Lead Channel Pacing Threshold Amplitude: 0.875 V
Lead Channel Pacing Threshold Amplitude: 1.125 V
Lead Channel Pacing Threshold Pulse Width: 0.4 ms
Lead Channel Pacing Threshold Pulse Width: 0.4 ms
Lead Channel Sensing Intrinsic Amplitude: 0.875 mV
Lead Channel Sensing Intrinsic Amplitude: 0.875 mV
Lead Channel Sensing Intrinsic Amplitude: 7.875 mV
Lead Channel Sensing Intrinsic Amplitude: 7.875 mV
Lead Channel Setting Pacing Amplitude: 2 V
Lead Channel Setting Pacing Amplitude: 2.5 V
Lead Channel Setting Pacing Pulse Width: 0.4 ms
Lead Channel Setting Sensing Sensitivity: 1.2 mV

## 2020-03-04 NOTE — Progress Notes (Signed)
PPM Remote  

## 2020-03-05 ENCOUNTER — Telehealth: Payer: Self-pay | Admitting: *Deleted

## 2020-03-05 NOTE — Telephone Encounter (Signed)
Called patient with test results. No answer. Left message to call back.  

## 2020-03-05 NOTE — Telephone Encounter (Signed)
-----   Message from Ellsworth Lennox, [place] sent at 03/04/2020 12:44 PM EDT ----- Please let the patient know his cholesterol has significantly improved. Total cholesterol was previously elevated to 197, now down to 146. HDL "good cholesterol" has improved from 34 to 44. LDL has declined from 136 to 80. Liver function remains within a normal range. Would continue current regimen for now and can review at his follow-up appointment with Dr. Wyline Mood next week if he wants to continue Atorvastatin 20mg  daily or further titrate to 40 mg daily given known CAD and goal LDL less than 70.

## 2020-03-11 ENCOUNTER — Encounter: Payer: Self-pay | Admitting: Cardiology

## 2020-03-11 ENCOUNTER — Ambulatory Visit (INDEPENDENT_AMBULATORY_CARE_PROVIDER_SITE_OTHER): Payer: Commercial Managed Care - PPO | Admitting: Cardiology

## 2020-03-11 ENCOUNTER — Other Ambulatory Visit: Payer: Self-pay

## 2020-03-11 VITALS — BP 118/80 | HR 77 | Ht 73.0 in | Wt 275.0 lb

## 2020-03-11 DIAGNOSIS — I2510 Atherosclerotic heart disease of native coronary artery without angina pectoris: Secondary | ICD-10-CM | POA: Diagnosis not present

## 2020-03-11 DIAGNOSIS — E782 Mixed hyperlipidemia: Secondary | ICD-10-CM | POA: Diagnosis not present

## 2020-03-11 DIAGNOSIS — I455 Other specified heart block: Secondary | ICD-10-CM | POA: Diagnosis not present

## 2020-03-11 MED ORDER — ATORVASTATIN CALCIUM 40 MG PO TABS: 40.0000 mg | ORAL_TABLET | Freq: Every day | ORAL | 1 refills | Status: DC
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
## 2020-03-11 NOTE — Patient Instructions (Signed)
Your physician wants you to follow-up in: 6 MONTHS WITH DR [facility] You will receive a reminder letter in the mail two months in advance. If you don't receive a letter, please call our office to schedule the follow-up appointment.  Your physician has recommended you make the following change in your medication:   INCREASE ATORVASTATIN 40 MG DAILY   CHECK BLOOD PRESSURE FOR 2 WEEKS AND CALL us WITH READINGS  Thank you for choosing [facility]!!

## 2020-03-15 ENCOUNTER — Telehealth: Payer: Self-pay

## 2020-03-15 NOTE — Telephone Encounter (Signed)
Advised Cavatron will not interfere with  PM

## 2020-03-15 NOTE — Telephone Encounter (Signed)
The pt left a message for the nurse to return his call. He has a dentist appointment and have some questions about the cavatron usage. His phone number is [phone].

## 2020-05-12 ENCOUNTER — Other Ambulatory Visit: Payer: Self-pay | Admitting: Gastroenterology

## 2020-05-12 DIAGNOSIS — R110 Nausea: Secondary | ICD-10-CM

## 2020-05-12 NOTE — Telephone Encounter (Signed)
First step would be to try lowering the atorvastatin and seeing if that helps, Can go back to 20mg  daily and set up next available appt with PA, would wait at least 2 weeks to allow any changes from the lower atorva dosing to take effect. Prior to the appt  MD

## 2020-05-13 ENCOUNTER — Other Ambulatory Visit: Payer: Self-pay | Admitting: Gastroenterology

## 2020-05-14 ENCOUNTER — Other Ambulatory Visit: Payer: Self-pay

## 2020-05-14 ENCOUNTER — Telehealth (INDEPENDENT_AMBULATORY_CARE_PROVIDER_SITE_OTHER): Payer: Commercial Managed Care - PPO | Admitting: Cardiology

## 2020-05-14 ENCOUNTER — Encounter: Payer: Self-pay | Admitting: Cardiology

## 2020-05-14 VITALS — BP 138/88 | HR 65 | Ht 73.5 in | Wt 272.0 lb

## 2020-05-14 DIAGNOSIS — E782 Mixed hyperlipidemia: Secondary | ICD-10-CM

## 2020-05-14 DIAGNOSIS — I455 Other specified heart block: Secondary | ICD-10-CM | POA: Diagnosis not present

## 2020-05-14 DIAGNOSIS — I2510 Atherosclerotic heart disease of native coronary artery without angina pectoris: Secondary | ICD-10-CM | POA: Diagnosis not present

## 2020-05-14 MED ORDER — ATORVASTATIN CALCIUM 20 MG PO TABS: 20.0000 mg | ORAL_TABLET | Freq: Every day | ORAL | 3 refills | Status: DC

## 2020-05-14 NOTE — Patient Instructions (Signed)
Your physician wants you to follow-up in: 6 MONTHS WITH DR [facility]  Your physician has recommended you make the following change in your medication:  DECREASE ATORVASTATIN 20 MG DAILY  UPDATE [place] IN 1 MONTH ON YOUR SYMPTOMS  Thank you for choosing [facility]!!
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
## 2020-05-18 ENCOUNTER — Encounter: Payer: Self-pay | Admitting: Internal Medicine

## 2020-05-18 ENCOUNTER — Other Ambulatory Visit: Payer: Self-pay

## 2020-05-18 ENCOUNTER — Ambulatory Visit (INDEPENDENT_AMBULATORY_CARE_PROVIDER_SITE_OTHER): Payer: Commercial Managed Care - PPO | Admitting: Internal Medicine

## 2020-05-18 VITALS — BP 118/84 | HR 87 | Temp 98.5°F | Ht 73.5 in | Wt 267.8 lb

## 2020-05-18 DIAGNOSIS — R0600 Dyspnea, unspecified: Secondary | ICD-10-CM

## 2020-05-18 DIAGNOSIS — R0609 Other forms of dyspnea: Secondary | ICD-10-CM
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
## 2020-05-18 NOTE — Patient Instructions (Addendum)
GERD (REFLUX)  is an extremely common cause of respiratory symptoms just like yours , many times with no obvious heartburn at all.    It can be treated with medication, but also with lifestyle changes including elevation of the head of your bed (ideally with 6 -8inch blocks under the headboard of your bed),  Smoking cessation, avoidance of late meals, excessive alcohol, and avoid fatty foods, chocolate, peppermint, colas, red wine, and acidic juices such as orange juice.  NO MINT OR MENTHOL PRODUCTS SO NO COUGH DROPS  USE SUGARLESS CANDY INSTEAD (Jolley ranchers or Stover's or Life Savers) or even ice chips will also do - the key is to swallow to prevent all throat clearing. NO OIL BASED VITAMINS - use powdered substitutes.  Avoid fish oil when coughing.  Continue pantoprazole 40mg  and add pecid 20 mg (over the counter)  after supper if you continue have  the night time symptoms   Make sure you check your oxygen saturations at highest level of activity to be sure it stays over 90% and adjust upward to maintain this level if needed but remember to turn it back to previous settings when you stop (to conserve your supply).   We need to schedule full PFTs here and then followup as needed in the [place] office

## 2020-05-19 ENCOUNTER — Encounter: Payer: Self-pay | Admitting: Internal Medicine

## 2020-05-19 NOTE — Assessment & Plan Note (Addendum)
Onset summer 2020 assoc with atypical paroxysms of two different cp patterns neither brought on with ex [facility]  10/20/2019 Conclusions: 1. Two vessel coronary artery disease with long 60% mid/distal LAD stenosis and 80-90% stenosis involving distal rPDA. 2. Normal left ventricular systolic function and filling pressure.  Recommendations: 1. Optimize medical therapy. We will add isosorbide mononitrate 15 mg daily. 2. Secondary prevention with indefinite ASA 81 mg daily. We will also start atorvastatin 20 mg daily for target LDL <70. 3. If the patient has refractory angina despite maximal tolerated doses of two antianginal agents, PCI to the mid LAD could be considered. 11/18/2019   Walked RA x3 laps =  approx 750 ft @ fast pace - stopped due to and of study with sats of 98% at the end of the study s cp or sob    Advised on how to tell anxiety related sob from pulmonary  / cardiac causes and strongly rec reg submax ex/ monitor sats and let cards know if any assoc ex cp   If has anymore noct spells would add pepcid  In pm and keep working on wt loss/ gerd diet and consider bed blocks/ reviewed in writing   F/u with pfts next available then we can see him in the [place] office moving forward.          Each maintenance medication was reviewed in detail including emphasizing most importantly the difference between maintenance and prns and under what circumstances the prns are to be triggered using an action plan format where appropriate.  Total time for H and P, chart review, counseling,  and generating customized AVS unique to this office visit / charting = 20 min

## 2020-05-21 ENCOUNTER — Other Ambulatory Visit: Payer: Commercial Managed Care - PPO

## 2020-05-27 ENCOUNTER — Ambulatory Visit
Admission: RE | Admit: 2020-05-27 | Discharge: 2020-05-27 | Disposition: A | Discharge status: Home / Self Care | Payer: Commercial Managed Care - PPO | Source: Ambulatory Visit | Attending: Gastroenterology | Admitting: Gastroenterology

## 2020-05-27 DIAGNOSIS — R110 Nausea: Secondary | ICD-10-CM

## 2020-06-03 ENCOUNTER — Ambulatory Visit (INDEPENDENT_AMBULATORY_CARE_PROVIDER_SITE_OTHER): Payer: Commercial Managed Care - PPO | Admitting: *Deleted

## 2020-06-03 DIAGNOSIS — I442 Atrioventricular block, complete: Secondary | ICD-10-CM | POA: Diagnosis not present

## 2020-06-03 LAB — CUP PACEART REMOTE DEVICE CHECK
Battery Remaining Longevity: 154 mo
Battery Voltage: 2.99 V
Brady Statistic AP VP Percent: 6.05 %
Brady Statistic AP VS Percent: 3.64 %
Brady Statistic AS VP Percent: 0.03 %
Brady Statistic AS VS Percent: 90.29 %
Brady Statistic RA Percent Paced: 9.76 %
Brady Statistic RV Percent Paced: 6.08 %
Date Time Interrogation Session: 20210708003434
Implantable Lead Implant Date: 20190123
Implantable Lead Implant Date: 20190123
Implantable Lead Location: 753859
Implantable Lead Location: 753860
Implantable Lead Model: 5076
Implantable Lead Model: 5076
Implantable Pulse Generator Implant Date: 20190123
Lead Channel Impedance Value: 323 Ohm
Lead Channel Impedance Value: 380 Ohm
Lead Channel Impedance Value: 399 Ohm
Lead Channel Impedance Value: 418 Ohm
Lead Channel Pacing Threshold Amplitude: 0.875 V
Lead Channel Pacing Threshold Amplitude: 1.25 V
Lead Channel Pacing Threshold Pulse Width: 0.4 ms
Lead Channel Pacing Threshold Pulse Width: 0.4 ms
Lead Channel Sensing Intrinsic Amplitude: 1.125 mV
Lead Channel Sensing Intrinsic Amplitude: 1.125 mV
Lead Channel Sensing Intrinsic Amplitude: 7.625 mV
Lead Channel Sensing Intrinsic Amplitude: 7.625 mV
Lead Channel Setting Pacing Amplitude: 2 V
Lead Channel Setting Pacing Amplitude: 2.5 V
Lead Channel Setting Pacing Pulse Width: 0.4 ms
Lead Channel Setting Sensing Sensitivity: 1.2 mV

## 2020-06-04 NOTE — Progress Notes (Signed)
Remote pacemaker transmission.   

## 2020-06-07 ENCOUNTER — Encounter: Payer: Self-pay | Admitting: Internal Medicine

## 2020-06-07 ENCOUNTER — Ambulatory Visit (INDEPENDENT_AMBULATORY_CARE_PROVIDER_SITE_OTHER): Payer: Commercial Managed Care - PPO | Admitting: Internal Medicine

## 2020-06-07 ENCOUNTER — Other Ambulatory Visit: Payer: Self-pay

## 2020-06-07 VITALS — BP 100/72 | HR 89 | Ht 73.0 in | Wt 265.0 lb

## 2020-06-07 DIAGNOSIS — I442 Atrioventricular block, complete: Secondary | ICD-10-CM | POA: Diagnosis not present

## 2020-06-07 DIAGNOSIS — Z87891 Personal history of nicotine dependence: Secondary | ICD-10-CM

## 2020-06-07 DIAGNOSIS — I2510 Atherosclerotic heart disease of native coronary artery without angina pectoris: Secondary | ICD-10-CM | POA: Diagnosis not present
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
## 2020-06-07 NOTE — Patient Instructions (Signed)
Medication Instructions:  Your physician recommends that you continue on your current medications as directed. Please refer to the Current Medication list given to you today.  *If you need a refill on your cardiac medications before your next appointment, please call your pharmacy*   Lab Work: NONE   If you have labs (blood work) drawn today and your tests are completely normal, you will receive your results only by: . MyChart Message (if you have MyChart) OR . A paper copy in the mail If you have any lab test that is abnormal or we need to change your treatment, we will call you to review the results.   Testing/Procedures: NONE    Follow-Up: At [facility], you and your health needs are our priority.  As part of our continuing mission to provide you with exceptional heart care, we have created designated Provider Care Teams.  These Care Teams include your primary Cardiologist (physician) and Advanced Practice Providers (APPs -  Physician Assistants and Nurse Practitioners) who all work together to provide you with the care you need, when you need it.  We recommend signing up for the patient portal called "MyChart".  Sign up information is provided on this After Visit Summary.  MyChart is used to connect with patients for Virtual Visits (Telemedicine).  Patients are able to view lab/test results, encounter notes, upcoming appointments, etc.  Non-urgent messages can be sent to your provider as well.   To learn more about what you can do with MyChart, go to [email or web address].    Your next appointment:   1 year(s)  The format for your next appointment:   In Person  Provider:   Gregg Taylor, MD   Other Instructions Thank you for choosing [facility]!    

## 2020-06-08 LAB — CUP PACEART INCLINIC DEVICE CHECK
Battery Remaining Longevity: 154 mo
Battery Voltage: 2.98 V
Brady Statistic AP VP Percent: 3.76 %
Brady Statistic AP VS Percent: 2.76 %
Brady Statistic AS VP Percent: 0.03 %
Brady Statistic AS VS Percent: 93.44 %
Brady Statistic RA Percent Paced: 6.59 %
Brady Statistic RV Percent Paced: 3.8 %
Date Time Interrogation Session: 20210712103600
Implantable Lead Implant Date: 20190123
Implantable Lead Implant Date: 20190123
Implantable Lead Location: 753859
Implantable Lead Location: 753860
Implantable Lead Model: 5076
Implantable Lead Model: 5076
Implantable Pulse Generator Implant Date: 20190123
Lead Channel Impedance Value: 342 Ohm
Lead Channel Impedance Value: 380 Ohm
Lead Channel Impedance Value: 418 Ohm
Lead Channel Impedance Value: 456 Ohm
Lead Channel Pacing Threshold Amplitude: 1 V
Lead Channel Pacing Threshold Amplitude: 1.125 V
Lead Channel Pacing Threshold Pulse Width: 0.4 ms
Lead Channel Pacing Threshold Pulse Width: 0.4 ms
Lead Channel Sensing Intrinsic Amplitude: 1.9 mV
Lead Channel Sensing Intrinsic Amplitude: 11 mV
Lead Channel Setting Pacing Amplitude: 2 V
Lead Channel Setting Pacing Amplitude: 2.5 V
Lead Channel Setting Pacing Pulse Width: 0.4 ms
Lead Channel Setting Sensing Sensitivity: 1.2 mV

## 2020-07-09 ENCOUNTER — Other Ambulatory Visit (HOSPITAL_COMMUNITY): Payer: Commercial Managed Care - PPO

## 2020-07-09 ENCOUNTER — Ambulatory Visit: Payer: Commercial Managed Care - PPO | Admitting: Internal Medicine

## 2020-07-15 ENCOUNTER — Encounter (HOSPITAL_COMMUNITY): Payer: Self-pay | Admitting: Physical Therapy

## 2020-07-15 NOTE — Therapy (Signed)
[place], [place], [address] Phone: [phone]   Fax:  [phone]  Patient Details  Name: Bill Castro MRN: [id number] Date of Birth: 1960-12-01 Referring Provider:  No ref. provider found  Encounter Date: 07/15/2020    PHYSICAL THERAPY DISCHARGE SUMMARY  Visits from Start of Care: 6 Current functional level related to goals / functional outcomes: Pt continues to have pain and deficits    Remaining deficits: Unknown as pt did not return for further visits    Education / Equipment: HEP Plan: Patient agrees to discharge.  Patient goals were not met. Patient is being discharged due to not returning since the last visit.  ?????     Rayetta Humphrey, PT CLT [phone] 07/15/2020, 3:01 PM  [place] [address] [place], [place], [address] Phone: [phone]   Fax:  [phone]

## 2020-07-21 ENCOUNTER — Other Ambulatory Visit: Payer: Self-pay | Admitting: Internal Medicine

## 2020-07-21 NOTE — Telephone Encounter (Signed)
Refill request

## 2020-08-17 ENCOUNTER — Other Ambulatory Visit: Payer: Self-pay | Admitting: Cardiology

## 2020-08-30 NOTE — Telephone Encounter (Signed)
Dr. Sherene Sires patient sent email   Dr. Sherene Sires. I am embarrassed to say I picked up smoking again a couple of months ago. Not making excuses but it was a crisis situation and to relieve stress, I started smoking again. Around a half a pack daily. I am back to quitting again, tapering down since that helped me stop last year. I am scheduled to have the pulmonary test in [place] this coming Friday the 8th. Then follow up with you in [place] on the 15th. Should I just postpone the pulmonary test for a bit once I stop smoking and my lungs clear? And if you think I should, I could still come to the appointment on the 15th if you think I need to be seen for an exam. Please let me know so I can postpone the pulmonary test and the follow-up visit, or keep the appointment on the 15th. Whatever you decide. Thanks and I look forward to hearing from you. Bill Castro   Dr. Sherene Sires please advise

## 2020-08-30 NOTE — Telephone Encounter (Signed)
Colonscopy would be fine at [facility]. Overall a low risk procedure, his heart history would not create any significant higher risk requiring it to be done in Kirstie Mirza MD

## 2020-08-30 NOTE — Telephone Encounter (Signed)
Let him know I factor in things like whether he is smoking or using any particular medication on the day of the pft and the office visit so my advice is proceed with both esp since it's hard to reschedule this study in the age of COVID

## 2020-09-01 ENCOUNTER — Telehealth: Payer: Self-pay | Admitting: Internal Medicine

## 2020-09-01 NOTE — Telephone Encounter (Signed)
Called and spoke with patient he states that he just saw ENT who put pt on Levofloxin and Prednisone(6day Taper). Dr.Teoh wanted the pt to call office to see if MW is okay with pt being on these medications for his pft 10/8 at 2pm or if he needs to reschedule appt for pft and MW f/u. Please advise [phone]    Dr. Sherene Sires please advise

## 2020-09-02 ENCOUNTER — Ambulatory Visit (INDEPENDENT_AMBULATORY_CARE_PROVIDER_SITE_OTHER): Payer: Commercial Managed Care - PPO

## 2020-09-02 ENCOUNTER — Encounter: Payer: Self-pay | Admitting: Internal Medicine

## 2020-09-02 ENCOUNTER — Telehealth: Payer: Self-pay | Admitting: Internal Medicine

## 2020-09-02 DIAGNOSIS — I442 Atrioventricular block, complete: Secondary | ICD-10-CM | POA: Diagnosis not present

## 2020-09-02 LAB — CUP PACEART REMOTE DEVICE CHECK
Battery Remaining Longevity: 150 mo
Battery Voltage: 2.98 V
Brady Statistic AP VP Percent: 4.24 %
Brady Statistic AP VS Percent: 2.53 %
Brady Statistic AS VP Percent: 0.03 %
Brady Statistic AS VS Percent: 93.21 %
Brady Statistic RA Percent Paced: 6.82 %
Brady Statistic RV Percent Paced: 4.27 %
Date Time Interrogation Session: 20211007003414
Implantable Lead Implant Date: 20190123
Implantable Lead Implant Date: 20190123
Implantable Lead Location: 753859
Implantable Lead Location: 753860
Implantable Lead Model: 5076
Implantable Lead Model: 5076
Implantable Pulse Generator Implant Date: 20190123
Lead Channel Impedance Value: 323 Ohm
Lead Channel Impedance Value: 361 Ohm
Lead Channel Impedance Value: 399 Ohm
Lead Channel Impedance Value: 437 Ohm
Lead Channel Pacing Threshold Amplitude: 0.75 V
Lead Channel Pacing Threshold Amplitude: 1 V
Lead Channel Pacing Threshold Pulse Width: 0.4 ms
Lead Channel Pacing Threshold Pulse Width: 0.4 ms
Lead Channel Sensing Intrinsic Amplitude: 2 mV
Lead Channel Sensing Intrinsic Amplitude: 2 mV
Lead Channel Sensing Intrinsic Amplitude: 7.625 mV
Lead Channel Sensing Intrinsic Amplitude: 7.625 mV
Lead Channel Setting Pacing Amplitude: 2 V
Lead Channel Setting Pacing Amplitude: 2.5 V
Lead Channel Setting Pacing Pulse Width: 0.4 ms
Lead Channel Setting Sensing Sensitivity: 1.2 mV

## 2020-09-02 NOTE — Telephone Encounter (Signed)
Called and spoke with pt in regards to the PFT and info stated by MW. Pt verbalized understanding. Pt's PFT appt has been rescheduled for 11/5 and his f/u with MW has been rescheduled for 11/19. Nothing further needed.  

## 2020-09-02 NOTE — Telephone Encounter (Signed)
If he's feeling ok should proceed with pfts - if acutely ill can reschedule both pft and ov

## 2020-09-02 NOTE — Telephone Encounter (Signed)
Called and spoke with pt in regards to the PFT and info stated by MW. Pt verbalized understanding. Pt's PFT appt has been rescheduled for 11/5 and his f/u with MW has been rescheduled for 11/19. Nothing further needed.

## 2020-09-02 NOTE — Telephone Encounter (Signed)
Lmtcb for pt.  

## 2020-09-06 NOTE — Progress Notes (Signed)
Remote pacemaker transmission.   

## 2020-09-14 ENCOUNTER — Encounter (HOSPITAL_COMMUNITY): Admission: RE | Payer: Self-pay | Source: Home / Self Care

## 2020-09-14 ENCOUNTER — Ambulatory Visit (HOSPITAL_COMMUNITY)
Admission: RE | Admit: 2020-09-14 | Payer: Commercial Managed Care - PPO | Source: Home / Self Care | Admitting: Gastroenterology

## 2020-09-14 SURGERY — COLONOSCOPY WITH PROPOFOL
Anesthesia: Monitor Anesthesia Care

## 2020-09-17 ENCOUNTER — Ambulatory Visit: Payer: Commercial Managed Care - PPO | Admitting: Internal Medicine

## 2020-10-01 ENCOUNTER — Other Ambulatory Visit: Payer: Self-pay

## 2020-10-01 ENCOUNTER — Ambulatory Visit (INDEPENDENT_AMBULATORY_CARE_PROVIDER_SITE_OTHER): Payer: Commercial Managed Care - PPO | Admitting: Internal Medicine

## 2020-10-01 DIAGNOSIS — R0600 Dyspnea, unspecified: Secondary | ICD-10-CM | POA: Diagnosis not present

## 2020-10-01 DIAGNOSIS — R0609 Other forms of dyspnea: Secondary | ICD-10-CM

## 2020-10-01 NOTE — Progress Notes (Signed)
Full PFT performed today. °

## 2020-10-02 LAB — PULMONARY FUNCTION TEST
DL/VA % pred: 97 %
DL/VA: 4.11 ml/min/mmHg/L
DLCO cor % pred: 89 %
DLCO cor: 27.37 ml/min/mmHg
DLCO unc % pred: 89 %
DLCO unc: 27.37 ml/min/mmHg
FEF 25-75 Post: 3.05 L/sec
FEF 25-75 Pre: 3.89 L/sec
FEF2575-%Change-Post: -21 %
FEF2575-%Pred-Post: 92 %
FEF2575-%Pred-Pre: 117 %
FEV1-%Change-Post: -1 %
FEV1-%Pred-Post: 82 %
FEV1-%Pred-Pre: 83 %
FEV1-Post: 3.33 L
FEV1-Pre: 3.38 L
FEV1FVC-%Change-Post: -2 %
FEV1FVC-%Pred-Pre: 108 %
FEV6-%Change-Post: 0 %
FEV6-%Pred-Post: 80 %
FEV6-%Pred-Pre: 80 %
FEV6-Post: 4.12 L
FEV6-Pre: 4.09 L
FEV6FVC-%Pred-Post: 104 %
FEV6FVC-%Pred-Pre: 104 %
FVC-%Change-Post: 0 %
FVC-%Pred-Post: 77 %
FVC-%Pred-Pre: 76 %
FVC-Post: 4.12 L
FVC-Pre: 4.09 L
Post FEV1/FVC ratio: 81 %
Post FEV6/FVC ratio: 100 %
Pre FEV1/FVC ratio: 83 %
Pre FEV6/FVC Ratio: 100 %
RV % pred: 96 %
RV: 2.3 L
TLC % pred: 91 %
TLC: 6.94 L

## 2020-10-09 ENCOUNTER — Telehealth: Payer: Self-pay | Admitting: Internal Medicine

## 2020-10-09 DIAGNOSIS — R9439 Abnormal result of other cardiovascular function study: Secondary | ICD-10-CM

## 2020-10-09 DIAGNOSIS — Z79899 Other long term (current) drug therapy: Secondary | ICD-10-CM

## 2020-10-09 NOTE — Telephone Encounter (Signed)
Cardiology Moonlighter Note  Returned page from patient. Having high blood pressure since yesterday. Felt some ringing in his ears so checked his BP, found this to be 145/95. Today upon waking, BP was 150/95. Currently only medication for BP is metoprolol XL 50mg  daily. Took xanax for anxiety regarding BP. He has an old supply of clonidine and wants to take this.   I recommended that the patient NOT take clonidine right now. Explained that BP is a long term risk factor and he likely needs a new blood pressure medication, but this doesn't need to be started emergently tonight. I informed him that I would reach out to Dr and someone from his office can contact him this week about blood pressure management. Patient is currently completely asymptomatic right now, but understands he should call back or come to the ED if develops any symptoms.   Wyline Mood, MD Cardiology Fellow, PGY-8

## 2020-10-11 ENCOUNTER — Other Ambulatory Visit: Payer: Self-pay | Admitting: *Deleted

## 2020-10-11 ENCOUNTER — Encounter: Payer: Self-pay | Admitting: *Deleted

## 2020-10-11 MED ORDER — LISINOPRIL 10 MG PO TABS: 10.0000 mg | ORAL_TABLET | Freq: Every day | ORAL | 3 refills | Status: DC

## 2020-10-11 MED ORDER — LISINOPRIL 10 MG PO TABS
10.0000 mg | ORAL_TABLET | Freq: Every day | ORAL | 3 refills | Status: DC
Start: 2020-10-11 — End: 2020-10-11

## 2020-10-11 NOTE — Addendum Note (Signed)
Addended by: Burman Nieves T on: 10/11/2020 11:23 AM   Modules accepted: Orders

## 2020-10-11 NOTE — Telephone Encounter (Signed)
Pt voiced understanding and will have labs done at [facility] in 2 weeks

## 2020-10-11 NOTE — Telephone Encounter (Signed)
Can we start this patient on lisinopril 10mg  daily, needs a bmet in 2 weeks   MD

## 2020-10-15 ENCOUNTER — Telehealth: Payer: Self-pay | Admitting: Cardiology

## 2020-10-15 ENCOUNTER — Other Ambulatory Visit: Payer: Self-pay

## 2020-10-15 ENCOUNTER — Encounter: Payer: Self-pay | Admitting: Gastroenterology

## 2020-10-15 ENCOUNTER — Ambulatory Visit (INDEPENDENT_AMBULATORY_CARE_PROVIDER_SITE_OTHER): Payer: Commercial Managed Care - PPO | Admitting: Internal Medicine

## 2020-10-15 ENCOUNTER — Ambulatory Visit (INDEPENDENT_AMBULATORY_CARE_PROVIDER_SITE_OTHER): Payer: Commercial Managed Care - PPO | Admitting: Gastroenterology

## 2020-10-15 ENCOUNTER — Encounter: Payer: Self-pay | Admitting: Internal Medicine

## 2020-10-15 DIAGNOSIS — R198 Other specified symptoms and signs involving the digestive system and abdomen: Secondary | ICD-10-CM | POA: Insufficient documentation

## 2020-10-15 DIAGNOSIS — R0989 Other specified symptoms and signs involving the circulatory and respiratory systems: Secondary | ICD-10-CM | POA: Insufficient documentation

## 2020-10-15 DIAGNOSIS — R0600 Dyspnea, unspecified: Secondary | ICD-10-CM | POA: Diagnosis not present

## 2020-10-15 DIAGNOSIS — K760 Fatty (change of) liver, not elsewhere classified: Secondary | ICD-10-CM

## 2020-10-15 DIAGNOSIS — K5900 Constipation, unspecified: Secondary | ICD-10-CM | POA: Insufficient documentation

## 2020-10-15 DIAGNOSIS — F17210 Nicotine dependence, cigarettes, uncomplicated: Secondary | ICD-10-CM

## 2020-10-15 DIAGNOSIS — Z1211 Encounter for screening for malignant neoplasm of colon: Secondary | ICD-10-CM | POA: Diagnosis not present

## 2020-10-15 DIAGNOSIS — R09A2 Foreign body sensation, throat: Secondary | ICD-10-CM

## 2020-10-15 DIAGNOSIS — K219 Gastro-esophageal reflux disease without esophagitis: Secondary | ICD-10-CM

## 2020-10-15 DIAGNOSIS — R0609 Other forms of dyspnea: Secondary | ICD-10-CM

## 2020-10-15 MED ORDER — RABEPRAZOLE SODIUM 20 MG PO TBEC: 20.0000 mg | DELAYED_RELEASE_TABLET | Freq: Every day | ORAL | 5 refills | Status: DC

## 2020-10-15 NOTE — Progress Notes (Signed)
Subjective:     Patient ID: Bill Castro, male   DOB: 03/11/1961    MRN: [id number]   Brief patient profile:  64 yowm active smoker  with background of seasonal rhinitis spring > fall better as got older and otcs then in June 2020 yardwork > felt a pop in back > one month after this paroxysms of gen chest and back pain sitting > activity lasting from sev minutes to Castro hour with sob >  Venetia Maxon eval rec shots has not started >  Then started having different of L shoulder pain also not necessarily related to activity.  Variably sob since then so referred to pulmonary clinic 11/18/2019 by Bill An PA with nl PFTs  09/30/20 x for low ERV  Prior hx of strangling / globus >  Teo eval  neg    [facility]  10/20/2019 Conclusions: 1. Two vessel coronary artery disease with long 60% mid/distal LAD stenosis and 80-90% stenosis involving distal rPDA. 2. Normal left ventricular systolic function and filling pressure.  Recommendations: 1. Optimize medical therapy. We will add isosorbide mononitrate 15 mg daily. 2. Secondary prevention with indefinite ASA 81 mg daily. We will also start atorvastatin 20 mg daily for target LDL <70. 3. If the patient has refractory angina despite maximal tolerated doses of two antianginal agents, PCI to the mid LAD could be considered.   11/18/2019  First  ov/Bill Castro re: sob /cp  Chief Complaint  Patient presents with  . Consult    Shortness of breath for the last several months.    Dyspnea:  15sec to one minute resolves on its own does ok x several flights of steps / no aerobiics Cough: none  Sleeping: flat/ one pillow SABA use: none  02: none  rec Submaximal exercise is the best way to stay fit and reduce your anxiety Please schedule a follow up visit in 6  months but call sooner if needed with pfts on return    05/18/2020  f/u ov/Bill Castro re: sob had second shot March 04 2020 [employer]  Chief Complaint  Patient presents with  . Follow-up    SOB at times. Maybe  associated with some anxiety.    Dyspnea:  No regular exercise / yardwork ok  Cough: occ maybe once [place] slt brown mucus assoc with nasal d/c Sleeping: one episode noct choking o/w sleeping fine flat  SABA use: none 02: none  rec GERD diet  Continue pantoprazole 40mg  and add pecid 20 mg (over the counter)  after supper if you continue have  the night time symptoms  Make sure you check your oxygen saturations at highest level of activity    10/15/2020  f/u ov/Bill Castro office/Bill Castro re:  Now on acei 10/11/2020  Chief Complaint  Patient presents with  . Follow-up    no complaints currently  Dyspnea:  None at present but no aeorbics  Cough: some worse since started lisinopril with throat tickle Sleeping: ok p xanax /sleeps flat SABA use: none 02: none    No obvious day to day or daytime variability or assoc excess/ purulent sputum or mucus plugs or hemoptysis or cp or chest tightness, subjective wheeze or overt sinus or hb symptoms.   Sleeping  without nocturnal  or early am exacerbation  of respiratory  c/o's or need for noct saba. Also denies any obvious fluctuation of symptoms with weather or environmental changes or other aggravating or alleviating factors except as outlined above   No unusual exposure hx or h/o childhood pna/  asthma or knowledge of premature birth.  Current Allergies, Complete Past Medical History, Past Surgical History, Family History, and Social History were reviewed in [employer] record.  ROS  The following are not active complaints unless bolded Hoarseness, sore throat, dysphagia, dental problems, itching, sneezing,  nasal congestion or discharge of excess mucus or purulent secretions, ear ache,   fever, chills, sweats, unintended wt loss or wt gain, classically pleuritic or exertional cp,  orthopnea pnd or arm/hand swelling  or leg swelling, presyncope, palpitations, abdominal pain, anorexia, nausea, vomiting, diarrhea  or change in  bowel habits or change in bladder habits, change in stools or change in urine, dysuria, hematuria,  rash, arthralgias, visual complaints, headache, numbness, weakness or ataxia or problems with walking or coordination,  change in mood or  memory.        Current Meds  Medication Sig  . acetaminophen (TYLENOL) 500 MG tablet Take 1,000 mg by mouth every 6 (six) hours as needed (back pain).   Marland Kitchen ALPRAZolam (XANAX) 1 MG tablet Take 0.5 tablets by mouth See admin instructions. Take one tablet (0.5 mg) by mouth daily at bedtime, may also take one tablet (0.5 mg) during the day as needed for anxiety  . aspirin EC 81 MG tablet Take 81 mg by mouth daily with lunch.   Marland Kitchen atorvastatin (LIPITOR) 20 MG tablet TAKE 1 TABLET(20 MG) BY MOUTH DAILY  . levothyroxine (SYNTHROID) 100 MCG tablet Take 100 mcg by mouth daily.   Marland Kitchen lisinopril (ZESTRIL) 10 MG tablet Take 1 tablet (10 mg total) by mouth daily. 10/11/2020 NEW  . meloxicam (MOBIC) 15 MG tablet Take 15 mg by mouth daily with lunch.   . metoprolol succinate (TOPROL-XL) 50 MG 24 hr tablet TAKE 1 TABLET BY MOUTH DAILY, TAKE WITH OR IMMEDIATELY FOLLOWING A MEAL  . nitroGLYCERIN (NITROSTAT) 0.4 MG SL tablet Place 1 tablet (0.4 mg total) under the tongue every 5 (five) minutes as needed for chest pain.  . RABEprazole (ACIPHEX) 20 MG tablet Take 1 tablet (20 mg total) by mouth daily before breakfast.              Objective:   Physical Exam   pleasant amb wm nad / good voice texture   10/15/2020      270   05/18/2020       267   11/18/19 255 lb 6.4 oz (115.8 kg)  10/31/19 253 lb (114.8 kg)  10/20/19 243 lb (110.2 kg)    Vital signs reviewed  10/15/2020  - Note at rest 02 sats  98% on RA   HEENT : pt wearing mask not removed for exam due to covid -19 concerns.    NECK :  without JVD/Nodes/TM/ nl carotid upstrokes bilaterally   LUNGS: no acc muscle use,  Nl contour chest which is clear to A and P bilaterally without cough on insp or exp  maneuvers   CV:  RRR  no s3 or murmur or increase in P2, and no edema   ABD:  Mildly obese soft and nontender with nl inspiratory excursion in the supine position. No bruits or organomegaly appreciated, bowel sounds nl  MS:  Nl gait/ ext warm without deformities, calf tenderness, cyanosis or clubbing No obvious joint restrictions   SKIN: warm and dry without lesions    NEURO:  alert, approp, nl sensorium with  no motor or cerebellar deficits apparent.        Assessment:

## 2020-10-15 NOTE — Patient Instructions (Addendum)
1. Stop pantoprazole. 2. Start aciphex 20mg  daily before breakfast.  3. You can take famotidine 20mg  in the evenings if needed for upper abdominal pain/burning/reflux. 4. Start miralax one capfule daily for constipation. Make sure you are having regular bowel movements so that your colonoscopy prep is successful. 5. Colonoscopy and upper endoscopy as scheduled. See separate instructions.  For fatty liver seen on your ultrasound. Your liver numbers (blood work) were normal in April 2021. You should have these checked yearly with your PCP. If they start to rise, please let know. See below information. Instructions for fatty liver:     Fatty Liver Disease  Fatty liver disease occurs when too much fat has built up in your liver cells. Fatty liver disease is also called hepatic steatosis or steatohepatitis. The liver removes harmful substances from your bloodstream and produces fluids that your body needs. It also helps your body use and store energy from the food you eat. In many cases, fatty liver disease does not cause symptoms or problems. It is often diagnosed when tests are being done for other reasons. However, over time, fatty liver can cause inflammation that may lead to more serious liver problems, such as scarring of the liver (cirrhosis) and liver failure. Fatty liver is associated with insulin resistance, increased body fat, high blood pressure (hypertension), and high cholesterol. These are features of metabolic syndrome and increase your risk for stroke, diabetes, and heart disease. What are the causes? This condition may be caused by:  Drinking too much alcohol.  Poor nutrition.  Obesity.  Cushing's syndrome.  Diabetes.  High cholesterol.  Certain drugs.  Poisons.  Some viral infections.  Pregnancy. What increases the risk? You are more likely to develop this condition if you:  Abuse alcohol.  Are overweight.  Have diabetes.  Have hepatitis.  Have a high  triglyceride level.  Are pregnant. What are the signs or symptoms? Fatty liver disease often does not cause symptoms. If symptoms do develop, they can include:  Fatigue.  Weakness.  Weight loss.  Confusion.  Abdominal pain.  Nausea and vomiting.  Yellowing of your skin and the white parts of your eyes (jaundice).  Itchy skin. How is this diagnosed? This condition may be diagnosed by:  A physical exam and medical history.  Blood tests.  Imaging tests, such as an ultrasound, CT scan, or MRI.  A liver biopsy. A small sample of liver tissue is removed using a needle. The sample is then looked at under a microscope. How is this treated? Fatty liver disease is often caused by other health conditions. Treatment for fatty liver may involve medicines and lifestyle changes to manage conditions such as:  Alcoholism.  High cholesterol.  Diabetes.  Being overweight or obese. Follow these instructions at home:   Do not drink alcohol. If you have trouble quitting, ask your health care provider how to safely quit with the help of medicine or a supervised program. This is important to keep your condition from getting worse.  Eat a healthy diet as told by your health care provider. Ask your health care provider about working with a diet and nutrition specialist (dietitian) to develop an eating plan.  Exercise regularly. This can help you lose weight and control your cholesterol and diabetes. Talk to your health care provider about an exercise plan and which activities are best for you.  Take over-the-counter and prescription medicines only as told by your health care provider.  Keep all follow-up visits as told by  your health care provider. This is important. Contact a health care provider if: You have trouble controlling your:  Blood sugar. This is especially important if you have diabetes.  Cholesterol.  Drinking of alcohol. Get help right away if:  You have abdominal  pain.  You have jaundice.  You have nausea and vomiting.  You vomit blood or material that looks like coffee grounds.  You have stools that are black, tar-like, or bloody. Summary  Fatty liver disease develops when too much fat builds up in the cells of your liver.  Fatty liver disease often causes no symptoms or problems. However, over time, fatty liver can cause inflammation that may lead to more serious liver problems, such as scarring of the liver (cirrhosis).  You are more likely to develop this condition if you abuse alcohol, are pregnant, are overweight, have diabetes, have hepatitis, or have high triglyceride levels.  Contact your health care provider if you have trouble controlling your weight, blood sugar, cholesterol, or drinking of alcohol. This information is not intended to replace advice given to you by your health care provider. Make sure you discuss any questions you have with your health care provider. Document Revised: 10/26/2017 Document Reviewed: 08/22/2017 Elsevier Patient Education  2020 [employer].
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
## 2020-10-15 NOTE — Patient Instructions (Signed)
Suggested e-cigs as an optional  "one way bridge"  Off all tobacco products   Weight control is simply a matter of calorie balance which needs to be tilted in your favor by eating less and exercising more.  To get the most out of exercise, you need to be continuously aware that you are short of breath, but never out of breath, for 30 minutes daily. As you improve, it will actually be easier for you to do the same amount of exercise  in  30 minutes so always push to the level where you are short of breath.    If cough worsens you will need a trial off lisinopril for 6 weeks before we need to see you here.   Pulmonary follow up is as needed 5

## 2020-10-15 NOTE — Telephone Encounter (Signed)
New message     Pt c/o medication issue:  1. Name of Medication: lisinopril (ZESTRIL) 10 MG tablet 2. How are you currently taking this medication (dosage and times per day)?  1 tablet with breakfast   3. Are you having a reaction (difficulty breathing--STAT)? yes  4. What is your medication issue? Fatigue and cough

## 2020-10-16 ENCOUNTER — Encounter: Payer: Self-pay | Admitting: Internal Medicine

## 2020-10-16 DIAGNOSIS — F17210 Nicotine dependence, cigarettes, uncomplicated: Secondary | ICD-10-CM | POA: Insufficient documentation

## 2020-10-16 NOTE — Assessment & Plan Note (Signed)
Counseled re importance of smoking cessation but did not meet time criteria for separate billing            Each maintenance medication was reviewed in detail including emphasizing most importantly the difference between maintenance and prns and under what circumstances the prns are to be triggered using an action plan format where appropriate.  Total time for H and P, chart review, counseling,   and generating customized AVS unique to this office visit / charting > 30 min

## 2020-10-16 NOTE — Assessment & Plan Note (Addendum)
Onset summer 2020 assoc with atypical paroxysms of two different cp patterns neither brought on with ex [facility]  10/20/2019 Conclusions: 1. Two vessel coronary artery disease with long 60% mid/distal LAD stenosis and 80-90% stenosis involving distal rPDA. 2. Normal left ventricular systolic function and filling pressure.  Recommendations: 1. Optimize medical therapy. We will add isosorbide mononitrate 15 mg daily. 2. Secondary prevention with indefinite ASA 81 mg daily. We will also start atorvastatin 20 mg daily for target LDL <70. 3. If the patient has refractory angina despite maximal tolerated doses of two antianginal agents, PCI to the mid LAD could be considered. 11/18/2019   Walked RA x3 laps =  approx 750 ft @ fast pace - stopped due to and of study with sats of 98% at the end of the study s cp or sob  PFT's  10/01/20  FEV1 3.38 (83 % ) ratio 0.83  p 0 % improvement from saba p 0 prior to study with DLCO  27.37 (89%) corrects to4.11 (97%)  for alv volume and FV curve min concave with ERV 39%    >>> So he does not have signiicant copd yet but the concavity may be an early sign (copd 0 ) that he is at risk and needs to stop smoking completely now.     >>> The erv reduction is due to body habitus most likely     Reviewed findings from pfts and progress to date - he really hasn't been challenged yet aerobically but as he has been cleared by cards reasonable to start regular sub - max ex up to 30 min daily and if not making progress CPST would be the next step.

## 2020-10-18 ENCOUNTER — Telehealth: Payer: Self-pay | Admitting: *Deleted

## 2020-10-18 MED ORDER — LISINOPRIL 5 MG PO TABS: 5.0000 mg | ORAL_TABLET | Freq: Every day | ORAL | 3 refills | Status: DC

## 2020-10-18 NOTE — Telephone Encounter (Signed)
    It appears he was started on Lisinopril last week by Dr. Wyline Mood due to elevated BP. The initial phone note mentioned facial swelling but was not mentioned in the return call. Can you please ask the patient if he has experienced this and gather more details as this could be concerning for possible angioedema with ACE-I and he should stop the medication immediately and we can send in an alternative. If no facial swelling and mostly dizziness, we can adjust the dose.   Signed, Ellsworth Lennox, PA-C 10/18/2020, 5:16 PM

## 2020-10-18 NOTE — Telephone Encounter (Signed)
Pt states that an hour after he takes lisinopril he is becoming dizzy and feeling lightheaded. Pt states he will take lisinopril with food in the morning. Please advise. Current BP is 120/80 HR of 77.

## 2020-10-18 NOTE — Telephone Encounter (Signed)
FOLLOW UP   Pt c/o medication issue:  1. Name of Medication: lisinopril   2. How are you currently taking this medication (dosage and times per day)? 1 x a day  3. Are you having a reaction (difficulty breathing--STAT)?  yes  4. What is your medication issue? Reactions are getting worse, he is getting dizziness,  And its effecting his memory , has fatigue , couch, at one point Friday the skin on his face seem really tight and felt like his face would explode.

## 2020-10-18 NOTE — Telephone Encounter (Signed)
LMOVM for pt to clal back to schedule procedure TCS/EGD/ +/-ED with propofol, ASA 3

## 2020-10-18 NOTE — Telephone Encounter (Signed)
Spoke with pt who denies that he is having facial swelling. Pt also denies SOB. Pt informed that these sx start to seek medical assistance. Randall An, PA-C notified and ordered that Lisinopril be lowered to 5 mg Daily. Pt notified and stated he would follow BP and update [place] via MyChart this weekend.

## 2020-10-19 ENCOUNTER — Telehealth: Payer: Self-pay | Admitting: *Deleted

## 2020-10-19 NOTE — Telephone Encounter (Signed)
Pt is also aware that if his insurance doesn't cover the medication, he is willing to use GoodRX coupon at [place] and the price will be $20.71.

## 2020-10-19 NOTE — Telephone Encounter (Signed)
Patient called in. He states the Aciphex is going to cost $265. It is not covered by his insurance. He wants something else called in. Patient request call back once called in.

## 2020-10-19 NOTE — Telephone Encounter (Signed)
Submitted a PA for Aciphex. Left a message for pt letting him know this. Pt called back and is aware a PA was submitted.

## 2020-10-19 NOTE — Telephone Encounter (Signed)
LMOVM for pt 

## 2020-10-19 NOTE — Telephone Encounter (Signed)
PA submitted via [facility]. Case ID# [id number]

## 2020-10-19 NOTE — Telephone Encounter (Signed)
Patient returned called. He is scheduled with Dr. Jena Gauss on 2/28 at 2:00pm. Patient aware will mail instructions with pre-op/covid test appt. He voiced understanding.

## 2020-10-19 NOTE — Telephone Encounter (Signed)
Bill Castro, please send Aciphex 20 mg daily to [facility]. PA was denied and pt can get medication for $20 with [facility].

## 2020-10-20 ENCOUNTER — Encounter: Payer: Self-pay | Admitting: *Deleted

## 2020-10-20 MED ORDER — RABEPRAZOLE SODIUM 20 MG PO TBEC: 20.0000 mg | DELAYED_RELEASE_TABLET | Freq: Every day | ORAL | 5 refills | Status: DC

## 2020-10-20 NOTE — Telephone Encounter (Signed)
RX done. 

## 2020-10-20 NOTE — Telephone Encounter (Signed)
Noted. Left a detailed message for pt. If pt doesn't want to pull up the GoodRX Apt on his phone, he can pickup a coupon.

## 2020-10-20 NOTE — Addendum Note (Signed)
Addended by: Tiffany Kocher on: 10/20/2020 10:03 AM   Modules accepted: Orders

## 2020-10-25 ENCOUNTER — Other Ambulatory Visit (HOSPITAL_COMMUNITY)
Admission: RE | Admit: 2020-10-25 | Discharge: 2020-10-25 | Disposition: A | Discharge status: Home / Self Care | Payer: Commercial Managed Care - PPO | Source: Ambulatory Visit | Attending: Cardiology | Admitting: Cardiology

## 2020-10-25 DIAGNOSIS — Z79899 Other long term (current) drug therapy: Secondary | ICD-10-CM | POA: Insufficient documentation

## 2020-10-25 LAB — BASIC METABOLIC PANEL
Anion gap: 8 (ref 5–15)
BUN: 12 mg/dL (ref 6–20)
CO2: 23 mmol/L (ref 22–32)
Calcium: 9.1 mg/dL (ref 8.9–10.3)
Chloride: 108 mmol/L (ref 98–111)
Creatinine, Ser: 1.03 mg/dL (ref 0.61–1.24)
GFR, Estimated: >60 mL/min (ref >60)
Glucose, Bld: 101 mg/dL — ABNORMAL HIGH (ref 70–99)
Potassium: 4.5 mmol/L (ref 3.5–5.1)
Sodium: 139 mmol/L (ref 135–145)

## 2020-10-25 NOTE — Addendum Note (Signed)
Addended by: Kerney Elbe on: 10/25/2020 04:01 PM   Modules accepted: Orders

## 2020-10-25 NOTE — Telephone Encounter (Signed)
CASE APPROVED [id number]

## 2020-10-29 ENCOUNTER — Other Ambulatory Visit: Payer: Self-pay | Admitting: Internal Medicine

## 2020-10-30 ENCOUNTER — Encounter (HOSPITAL_COMMUNITY): Payer: Self-pay

## 2020-10-30 ENCOUNTER — Other Ambulatory Visit: Payer: Self-pay

## 2020-10-30 ENCOUNTER — Emergency Department (HOSPITAL_COMMUNITY)
Admission: EM | Admit: 2020-10-30 | Discharge: 2020-10-30 | Disposition: A | Discharge status: Home / Self Care | Payer: Commercial Managed Care - PPO | Attending: Emergency Medicine | Admitting: Emergency Medicine

## 2020-10-30 DIAGNOSIS — E039 Hypothyroidism, unspecified: Secondary | ICD-10-CM | POA: Insufficient documentation

## 2020-10-30 DIAGNOSIS — R002 Palpitations: Secondary | ICD-10-CM | POA: Diagnosis not present

## 2020-10-30 DIAGNOSIS — I10 Essential (primary) hypertension: Secondary | ICD-10-CM | POA: Diagnosis not present

## 2020-10-30 DIAGNOSIS — F17210 Nicotine dependence, cigarettes, uncomplicated: Secondary | ICD-10-CM | POA: Insufficient documentation

## 2020-10-30 DIAGNOSIS — I2510 Atherosclerotic heart disease of native coronary artery without angina pectoris: Secondary | ICD-10-CM | POA: Diagnosis not present

## 2020-10-30 DIAGNOSIS — Z7982 Long term (current) use of aspirin: Secondary | ICD-10-CM | POA: Diagnosis not present

## 2020-10-30 DIAGNOSIS — Z79899 Other long term (current) drug therapy: Secondary | ICD-10-CM | POA: Insufficient documentation

## 2020-10-30 DIAGNOSIS — R000 Tachycardia, unspecified: Secondary | ICD-10-CM | POA: Diagnosis present

## 2020-10-30 LAB — CBC WITH DIFFERENTIAL/PLATELET
Abs Immature Granulocytes: 0.03 10*3/uL (ref 0.00–0.07)
Basophils Absolute: 0.1 10*3/uL (ref 0.0–0.1)
Basophils Relative: 1 %
Eosinophils Absolute: 0.3 10*3/uL (ref 0.0–0.5)
Eosinophils Relative: 3 %
HCT: 44.3 % (ref 39.0–52.0)
Hemoglobin: 15.3 g/dL (ref 13.0–17.0)
Immature Granulocytes: 0 %
Lymphocytes Relative: 21 %
Lymphs Abs: 2.3 10*3/uL (ref 0.7–4.0)
MCH: 33 pg (ref 26.0–34.0)
MCHC: 34.5 g/dL (ref 30.0–36.0)
MCV: 95.5 fL (ref 80.0–100.0)
Monocytes Absolute: 1 10*3/uL (ref 0.1–1.0)
Monocytes Relative: 10 %
Neutro Abs: 7.1 10*3/uL (ref 1.7–7.7)
Neutrophils Relative %: 65 %
Platelets: 281 10*3/uL (ref 150–400)
RBC: 4.64 MIL/uL (ref 4.22–5.81)
RDW: 12.6 % (ref 11.5–15.5)
WBC: 10.8 10*3/uL — ABNORMAL HIGH (ref 4.0–10.5)
nRBC: 0 % (ref 0.0–0.2)

## 2020-10-30 LAB — COMPREHENSIVE METABOLIC PANEL
ALT: 29 U/L (ref 0–44)
AST: 25 U/L (ref 15–41)
Albumin: 4.4 g/dL (ref 3.5–5.0)
Alkaline Phosphatase: 52 U/L (ref 38–126)
Anion gap: 10 (ref 5–15)
BUN: 15 mg/dL (ref 6–20)
CO2: 23 mmol/L (ref 22–32)
Calcium: 9.5 mg/dL (ref 8.9–10.3)
Chloride: 106 mmol/L (ref 98–111)
Creatinine, Ser: 1.07 mg/dL (ref 0.61–1.24)
GFR, Estimated: >60 mL/min (ref >60)
Glucose, Bld: 100 mg/dL — ABNORMAL HIGH (ref 70–99)
Potassium: 3.6 mmol/L (ref 3.5–5.1)
Sodium: 139 mmol/L (ref 135–145)
Total Bilirubin: 0.6 mg/dL (ref 0.3–1.2)
Total Protein: 7.3 g/dL (ref 6.5–8.1)

## 2020-10-30 LAB — MAGNESIUM: Magnesium: 2.5 mg/dL — ABNORMAL HIGH (ref 1.7–2.4)

## 2020-10-30 LAB — TSH: TSH: 1.487 u[IU]/mL (ref 0.350–4.500)
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
## 2020-10-30 NOTE — Discharge Instructions (Addendum)
Your blood work results tonight are good, your electrolytes are normal including your calcium, potassium, and magnesium.  Your thyroid test is within the normal range.  This palpitations may be related to stress tonight with what is been going on.  If it continues to be a problem you can call Dr. Wyline Mood and see if you need to be sooner in the office.  Return to the emergency room if you get chest pain, shortness of breath, lightheaded or dizzy like you might pass out or if your heart rate remains very fast over an hour.

## 2020-10-30 NOTE — ED Triage Notes (Signed)
Pt says that he has had a high heart rate for the last couple of days. Pt states that it got worse today. Pt states HR got up in the 130's. Pt has pacemaker and states that he was started on a new blood pressure medication a few weeks ago and has not felt the same since.

## 2020-11-02 ENCOUNTER — Telehealth: Payer: Self-pay | Admitting: Cardiology

## 2020-11-02 NOTE — Telephone Encounter (Signed)
New message      Patient was in Er for tachycardia and today he has bad headache 180/108 hr 103 then  It dropped to 140/103  , then when he got to office he could feel his heart beat in his ears , what can he do to get him to Friday to see Dr Wyline Mood?

## 2020-11-02 NOTE — Telephone Encounter (Signed)
Bill Castro is going to take Xanax as directed and possibly consider speaking with behavioral health.he believes he may have PTSD.  He is looking forward to seeing Dr.Branch this Friday.

## 2020-11-05 ENCOUNTER — Telehealth: Payer: Self-pay

## 2020-11-05 ENCOUNTER — Other Ambulatory Visit: Payer: Self-pay

## 2020-11-05 ENCOUNTER — Encounter: Payer: Self-pay | Admitting: Cardiology

## 2020-11-05 ENCOUNTER — Ambulatory Visit (INDEPENDENT_AMBULATORY_CARE_PROVIDER_SITE_OTHER): Payer: Commercial Managed Care - PPO | Admitting: Cardiology

## 2020-11-05 VITALS — BP 118/80 | Ht 73.0 in | Wt 256.8 lb

## 2020-11-05 DIAGNOSIS — F419 Anxiety disorder, unspecified: Secondary | ICD-10-CM | POA: Diagnosis not present

## 2020-11-05 DIAGNOSIS — I2510 Atherosclerotic heart disease of native coronary artery without angina pectoris: Secondary | ICD-10-CM

## 2020-11-05 DIAGNOSIS — I455 Other specified heart block: Secondary | ICD-10-CM | POA: Diagnosis not present

## 2020-11-05 DIAGNOSIS — R002 Palpitations: Secondary | ICD-10-CM

## 2020-11-05 MED ORDER — METOPROLOL SUCCINATE ER 50 MG PO TB24: 75.0000 mg | ORAL_TABLET | Freq: Every day | ORAL | 3 refills | Status: DC

## 2020-11-05 NOTE — Telephone Encounter (Signed)
Dr. Wyline Mood wanted the pt to send a transmission for the nurse to review because the patient been having some tachycardia. The pt did try to send the transmission but received error codes. I conference [employer] and they are sending the patient a new handheld reader in 7-10 business days. I scheduled the pt to come into the office with the device clinic Tuesday December 14 at 3:20 pm. The patient agreed to come to the appointment.

## 2020-11-05 NOTE — Patient Instructions (Signed)
Medication Instructions:  Increase Toprol XL to 75 mg daily *If you need a refill on your cardiac medications before your next appointment, please call your pharmacy*   Lab Work: None today If you have labs (blood work) drawn today and your tests are completely normal, you will receive your results only by: Marland Kitchen MyChart Message (if you have MyChart) OR . A paper copy in the mail If you have any lab test that is abnormal or we need to change your treatment, we will call you to review the results.   Testing/Procedures: None today   Follow-Up: At [facility], you and your health needs are our priority.  As part of our continuing mission to provide you with exceptional heart care, we have created designated Provider Care Teams.  These Care Teams include your primary Cardiologist (physician) and Advanced Practice Providers (APPs -  Physician Assistants and Nurse Practitioners) who all work together to provide you with the care you need, when you need it.  We recommend signing up for the patient portal called "MyChart".  Sign up information is provided on this After Visit Summary.  MyChart is used to connect with patients for Virtual Visits (Telemedicine).  Patients are able to view lab/test results, encounter notes, upcoming appointments, etc.  Non-urgent messages can be sent to your provider as well.   To learn more about what you can do with MyChart, go to [email or web address].    Your next appointment:   4 month(s)  The format for your next appointment:   In Person  Provider:   Dina Rich, MD   Other Instructions You have been referred to [facility]. They will reach out to you to make an appointment.  You will need a Device Clinic appointment to check your pace maker.
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
## 2020-11-08 NOTE — Telephone Encounter (Signed)
Sinus arrest/asystole was the diagnosis that lead to his pacemaker. Essentially that means the electrical system of the heart stops working for a period of time, which is what led to the pacemaker. By keeping it documented in his chart it remains clear to anyone who sees his chart in the future why he had to have a pacemaker put in  Dina Rich MD

## 2020-11-09 ENCOUNTER — Ambulatory Visit (INDEPENDENT_AMBULATORY_CARE_PROVIDER_SITE_OTHER): Payer: Commercial Managed Care - PPO | Admitting: Emergency Medicine

## 2020-11-09 ENCOUNTER — Other Ambulatory Visit: Payer: Self-pay

## 2020-11-09 DIAGNOSIS — I442 Atrioventricular block, complete: Secondary | ICD-10-CM

## 2020-11-09 LAB — CUP PACEART INCLINIC DEVICE CHECK
Battery Remaining Longevity: 148 mo
Battery Voltage: 2.97 V
Brady Statistic AP VP Percent: 3.22 %
Brady Statistic AP VS Percent: 3.06 %
Brady Statistic AS VP Percent: 0.03 %
Brady Statistic AS VS Percent: 93.69 %
Brady Statistic RA Percent Paced: 6.32 %
Brady Statistic RV Percent Paced: 3.25 %
Date Time Interrogation Session: 20211214153319
Implantable Lead Implant Date: 20190123
Implantable Lead Implant Date: 20190123
Implantable Lead Location: 753859
Implantable Lead Location: 753860
Implantable Lead Model: 5076
Implantable Lead Model: 5076
Implantable Pulse Generator Implant Date: 20190123
Lead Channel Impedance Value: 361 Ohm
Lead Channel Impedance Value: 380 Ohm
Lead Channel Impedance Value: 437 Ohm
Lead Channel Impedance Value: 475 Ohm
Lead Channel Pacing Threshold Amplitude: 0.75 V
Lead Channel Pacing Threshold Amplitude: 1 V
Lead Channel Pacing Threshold Pulse Width: 0.4 ms
Lead Channel Pacing Threshold Pulse Width: 0.4 ms
Lead Channel Sensing Intrinsic Amplitude: 2.75 mV
Lead Channel Sensing Intrinsic Amplitude: 9.75 mV
Lead Channel Setting Pacing Amplitude: 2 V
Lead Channel Setting Pacing Amplitude: 2.5 V
Lead Channel Setting Pacing Pulse Width: 0.4 ms
Lead Channel Setting Sensing Sensitivity: 1.2 mV

## 2020-11-09 NOTE — Progress Notes (Signed)
Pacemaker check in clinic. Normal device function. Thresholds, sensing, impedances consistent with previous measurements. Device programmed to maximize longevity. No mode switch or high ventricular rates noted. Device programmed at appropriate safety margins. Histogram distribution appropriate for patient activity level. Device programmed to optimize intrinsic conduction. Estimated longevity 12 yrs , 4 months. Patient enrolled in remote follow-up and next remote 12/02/20. Patient education completed.

## 2020-11-09 NOTE — Progress Notes (Signed)
May have been updated in pacemaker clinic but normal pacemaker check. Will see how symptoms respond to med change, also seeing behavioral health may offer some options to help symptoms if anxiety related   Dominga Ferry MD

## 2020-11-10 ENCOUNTER — Other Ambulatory Visit (HOSPITAL_COMMUNITY): Payer: Self-pay | Admitting: Internal Medicine

## 2020-11-10 ENCOUNTER — Other Ambulatory Visit: Payer: Self-pay | Admitting: Internal Medicine

## 2020-11-10 DIAGNOSIS — G459 Transient cerebral ischemic attack, unspecified: Secondary | ICD-10-CM

## 2020-11-12 ENCOUNTER — Ambulatory Visit: Payer: Commercial Managed Care - PPO | Attending: Internal Medicine

## 2020-11-12 DIAGNOSIS — Z23 Encounter for immunization: Secondary | ICD-10-CM

## 2020-11-12 NOTE — Progress Notes (Signed)
   [facility]ov[facility]ination [facility]  Name:  Bill Castro    MRN: [id number] DOB: November 07, 1961  11/12/2020  [name] was observed post Covid-19 immunization for 15 minutes without incident. He was provided with Vaccine Information Sheet and instruction to access the V-Safe system.   [name] was instructed to call 911 with any severe reactions post vaccine: Marland Kitchen Difficulty breathing  . Swelling of face and throat  . A fast heartbeat  . A bad rash all over body  . Dizziness and weakness   Immunizations Administered    Name Date Dose VIS Date Route   Pfizer COVID-19 Vaccine 11/12/2020  3:51 PM 0.3 mL 09/15/2020 Intramuscular   Manufacturer: [employer], [employer]   Lot: [id number]   NDC: [address]-6

## 2020-11-16 ENCOUNTER — Telehealth: Payer: Self-pay

## 2020-11-16 NOTE — Telephone Encounter (Signed)
LMOVM to let the patient know we got his transmission.

## 2020-11-16 NOTE — Telephone Encounter (Signed)
No phone note needed. 

## 2020-11-17 ENCOUNTER — Ambulatory Visit (HOSPITAL_COMMUNITY): Payer: Commercial Managed Care - PPO

## 2020-11-17 ENCOUNTER — Encounter (HOSPITAL_COMMUNITY): Payer: Self-pay

## 2020-11-22 ENCOUNTER — Ambulatory Visit (HOSPITAL_COMMUNITY)
Admission: RE | Admit: 2020-11-22 | Discharge: 2020-11-22 | Disposition: A | Discharge status: Home / Self Care | Payer: Commercial Managed Care - PPO | Source: Ambulatory Visit | Attending: Internal Medicine | Admitting: Internal Medicine

## 2020-11-22 ENCOUNTER — Other Ambulatory Visit: Payer: Self-pay

## 2020-11-22 DIAGNOSIS — G459 Transient cerebral ischemic attack, unspecified: Secondary | ICD-10-CM | POA: Insufficient documentation

## 2020-11-29 ENCOUNTER — Other Ambulatory Visit: Payer: Self-pay

## 2020-11-29 ENCOUNTER — Ambulatory Visit (HOSPITAL_COMMUNITY)
Admission: RE | Admit: 2020-11-29 | Discharge: 2020-11-29 | Disposition: A | Discharge status: Home / Self Care | Payer: Commercial Managed Care - PPO | Source: Ambulatory Visit | Attending: Internal Medicine | Admitting: Internal Medicine

## 2020-11-29 DIAGNOSIS — G459 Transient cerebral ischemic attack, unspecified: Secondary | ICD-10-CM | POA: Diagnosis present

## 2020-11-29 NOTE — Progress Notes (Signed)
Per order, changed device settings for MRI to  ODO  Will program device back to pre-MRI settings after completion of exam, and send transmission.

## 2020-11-30 ENCOUNTER — Ambulatory Visit (HOSPITAL_COMMUNITY): Payer: Commercial Managed Care - PPO

## 2020-12-02 ENCOUNTER — Ambulatory Visit (INDEPENDENT_AMBULATORY_CARE_PROVIDER_SITE_OTHER): Payer: Commercial Managed Care - PPO

## 2020-12-02 DIAGNOSIS — I442 Atrioventricular block, complete: Secondary | ICD-10-CM

## 2020-12-02 LAB — CUP PACEART REMOTE DEVICE CHECK
Battery Remaining Longevity: 147 mo
Battery Voltage: 2.97 V
Brady Statistic AP VP Percent: 4.7 %
Brady Statistic AP VS Percent: 7.46 %
Brady Statistic AS VP Percent: 0.03 %
Brady Statistic AS VS Percent: 87.81 %
Brady Statistic RA Percent Paced: 12.19 %
Brady Statistic RV Percent Paced: 4.73 %
Date Time Interrogation Session: 20220105233345
Implantable Lead Implant Date: 20190123
Implantable Lead Implant Date: 20190123
Implantable Lead Location: 753859
Implantable Lead Location: 753860
Implantable Lead Model: 5076
Implantable Lead Model: 5076
Implantable Pulse Generator Implant Date: 20190123
Lead Channel Impedance Value: 323 Ohm
Lead Channel Impedance Value: 361 Ohm
Lead Channel Impedance Value: 399 Ohm
Lead Channel Impedance Value: 418 Ohm
Lead Channel Pacing Threshold Amplitude: 0.75 V
Lead Channel Pacing Threshold Amplitude: 1.125 V
Lead Channel Pacing Threshold Pulse Width: 0.4 ms
Lead Channel Pacing Threshold Pulse Width: 0.4 ms
Lead Channel Sensing Intrinsic Amplitude: 0.875 mV
Lead Channel Sensing Intrinsic Amplitude: 0.875 mV
Lead Channel Sensing Intrinsic Amplitude: 9.125 mV
Lead Channel Sensing Intrinsic Amplitude: 9.125 mV
Lead Channel Setting Pacing Amplitude: 2 V
Lead Channel Setting Pacing Amplitude: 2.5 V
Lead Channel Setting Pacing Pulse Width: 0.4 ms
Lead Channel Setting Sensing Sensitivity: 1.2 mV

## 2020-12-03 NOTE — Telephone Encounter (Signed)
I would defer to Dr Sherwood Gambler but I believe the MRI shows just some age related changes that are fairly common. I saw his last device check looked fine as well. From a heart standpoint I don't see anything additional to do at this time.  Dina Rich MD

## 2020-12-15 ENCOUNTER — Other Ambulatory Visit: Payer: Self-pay | Admitting: Cardiology

## 2020-12-16 NOTE — Progress Notes (Signed)
Remote pacemaker transmission.   

## 2020-12-30 ENCOUNTER — Encounter: Payer: Self-pay | Admitting: *Deleted

## 2021-01-04 ENCOUNTER — Encounter: Payer: Self-pay | Admitting: Diagnostic Neuroimaging

## 2021-01-04 ENCOUNTER — Ambulatory Visit (INDEPENDENT_AMBULATORY_CARE_PROVIDER_SITE_OTHER): Payer: Commercial Managed Care - PPO | Admitting: Diagnostic Neuroimaging

## 2021-01-04 VITALS — BP 125/81 | HR 86 | Ht 73.5 in | Wt 263.2 lb

## 2021-01-04 DIAGNOSIS — R202 Paresthesia of skin: Secondary | ICD-10-CM

## 2021-01-04 DIAGNOSIS — R200 Anesthesia of skin: Secondary | ICD-10-CM | POA: Diagnosis not present

## 2021-01-04 NOTE — Patient Instructions (Signed)
RIGHT FACE NUMBNESS - continue aspirin 81mg  daily - continue BP control, lipid control - try to stop smoking - optimize nutrition and exercise  RIGHT HAND NUMBNESS - possible carpal tunnel syndrome vs ulnar neuropathy; mild symptoms, may consider EMG/NCS in future
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
## 2021-01-20 NOTE — Patient Instructions (Signed)
Bill Castro  01/20/2021     @   Your procedure is scheduled on  01/24/2021   Report to [facility] at  1245  P.M.   Call this number if you have problems the morning of surgery:  [phone]   Remember:  Follow the diet and prep instructions given to you by the office.                       Take these medicines the morning of surgery with A SIP OF WATER  Xanax (if needed), levothyroxine, mobic (if needed), metoprolol.    Please brush your teeth.  Do not wear jewelry, make-up or nail polish.  Do not wear lotions, powders, or perfumes, or deodorant.  Do not shave 48 hours prior to surgery.  Men may shave face and neck.  Do not bring valuables to the hospital.  [facility] is not responsible for any belongings or valuables.  Contacts, dentures or bridgework may not be worn into surgery.  Leave your suitcase in the car.  After surgery it may be brought to your room.  For patients admitted to the hospital, discharge time will be determined by your treatment team.  Patients discharged the day of surgery will not be allowed to drive home and must have someone with them for 24 hours.    Special instructions:   DO NOT smoke tobacco or vape the morning of your procedure.  Please read over the following fact sheets that you were given. Anesthesia Post-op Instructions and Care and Recovery After Surgery       Upper Endoscopy, Adult, Care After This sheet gives you information about how to care for yourself after your procedure. Your health care provider may also give you more specific instructions. If you have problems or questions, contact your health care provider. What can I expect after the procedure? After the procedure, it is common to have:  A sore throat.  Mild stomach pain or discomfort.  Bloating.  Nausea. Follow these instructions at home:  Follow instructions from your health care provider about what to eat or drink  after your procedure.  Return to your normal activities as told by your health care provider. Ask your health care provider what activities are safe for you.  Take over-the-counter and prescription medicines only as told by your health care provider.  If you were given a sedative during the procedure, it can affect you for several hours. Do not drive or operate machinery until your health care provider says that it is safe.  Keep all follow-up visits as told by your health care provider. This is important.   Contact a health care provider if you have:  A sore throat that lasts longer than one day.  Trouble swallowing. Get help right away if:  You vomit blood or your vomit looks like coffee grounds.  You have: ? A fever. ? Bloody, black, or tarry stools. ? A severe sore throat or you cannot swallow. ? Difficulty breathing. ? Severe pain in your chest or abdomen. Summary  After the procedure, it is common to have a sore throat, mild stomach discomfort, bloating, and nausea.  If you were given a sedative during the procedure, it can affect you for several hours. Do not drive or operate machinery until your health care provider says that it is safe.  Follow instructions from your health care provider about what to  eat or drink after your procedure.  Return to your normal activities as told by your health care provider. This information is not intended to replace advice given to you by your health care provider. Make sure you discuss any questions you have with your health care provider. Document Revised: 11/11/2019 Document Reviewed: 04/15/2018 Elsevier Patient Education  2021 Elsevier Inc.  https://www.asge.org/home/for-patients/patient-information/understanding-eso-dilation-updated">  Esophageal Dilatation Esophageal dilatation, also called esophageal dilation, is a procedure to widen or open a blocked or narrowed part of the esophagus. The esophagus is the part of the body that  moves food and liquid from the mouth to the stomach. You may need this procedure if:  You have a buildup of scar tissue in your esophagus that makes it difficult, painful, or impossible to swallow. This can be caused by gastroesophageal reflux disease (GERD).  You have cancer of the esophagus.  There is a problem with how food moves through your esophagus. In some cases, you may need this procedure repeated at a later time to dilate the esophagus gradually. Tell a health care provider about:  Any allergies you have.  All medicines you are taking, including vitamins, herbs, eye drops, creams, and over-the-counter medicines.  Any problems you or family members have had with anesthetic medicines.  Any blood disorders you have.  Any surgeries you have had.  Any medical conditions you have.  Any antibiotic medicines you are required to take before dental procedures.  Whether you are pregnant or may be pregnant. What are the risks? Generally, this is a safe procedure. However, problems may occur, including:  Bleeding due to a tear in the lining of the esophagus.  A hole, or perforation, in the esophagus. What happens before the procedure?  Ask your health care provider about: ? Changing or stopping your regular medicines. This is especially important if you are taking diabetes medicines or blood thinners. ? Taking medicines such as aspirin and ibuprofen. These medicines can thin your blood. Do not take these medicines unless your health care provider tells you to take them. ? Taking over-the-counter medicines, vitamins, herbs, and supplements.  Follow instructions from your health care provider about eating or drinking restrictions.  Plan to have a responsible adult take you home from the hospital or clinic.  Plan to have a responsible adult care for you for the time you are told after you leave the hospital or clinic. This is important. What happens during the procedure?  You  may be given a medicine to help you relax (sedative).  A numbing medicine may be sprayed into the back of your throat, or you may gargle the medicine.  Your health care provider may perform the dilatation using various surgical instruments, such as: ? Simple dilators. This instrument is carefully placed in the esophagus to stretch it. ? Guided wire bougies. This involves using an endoscope to insert a wire into the esophagus. A dilator is passed over this wire to enlarge the esophagus. Then the wire is removed. ? Balloon dilators. An endoscope with a small balloon is inserted into the esophagus. The balloon is inflated to stretch the esophagus and open it up. The procedure may vary among health care providers and hospitals. What can I expect after the procedure?  Your blood pressure, heart rate, breathing rate, and blood oxygen level will be monitored until you leave the hospital or clinic.  Your throat may feel slightly sore and numb. This will get better over time.  You will not be allowed to eat  or drink until your throat is no longer numb.  When you are able to drink, urinate, and sit on the edge of the bed without nausea or dizziness, you may be able to return home. Follow these instructions at home:  Take over-the-counter and prescription medicines only as told by your health care provider.  If you were given a sedative during the procedure, it can affect you for several hours. Do not drive or operate machinery until your health care provider says that it is safe.  Plan to have a responsible adult care for you for the time you are told. This is important.  Follow instructions from your health care provider about any eating or drinking restrictions.  Do not use any products that contain nicotine or tobacco, such as cigarettes, e-cigarettes, and chewing tobacco. If you need help quitting, ask your health care provider.  Keep all follow-up visits. This is important. Contact a health  care provider if:  You have a fever.  You have pain that is not relieved by medicine. Get help right away if:  You have chest pain.  You have trouble breathing.  You have trouble swallowing.  You vomit blood.  You have black, tarry, or bloody stools. These symptoms may represent a serious problem that is an emergency. Do not wait to see if the symptoms will go away. Get medical help right away. Call your local emergency services (911 in the [place] Do not drive yourself to the hospital. Summary  Esophageal dilatation, also called esophageal dilation, is a procedure to widen or open a blocked or narrowed part of the esophagus.  Plan to have a responsible adult take you home from the hospital or clinic.  For this procedure, a numbing medicine may be sprayed into the back of your throat, or you may gargle the medicine.  Do not drive or operate machinery until your health care provider says that it is safe. This information is not intended to replace advice given to you by your health care provider. Make sure you discuss any questions you have with your health care provider. Document Revised: 03/31/2020 Document Reviewed: 03/31/2020 Elsevier Patient Education  2021 Else[employer] Inc.  Colonoscopy, Adult, Care After This sheet gives you information about how to care for yourself after your procedure. Your health care provider may also give you more specific instructions. If you have problems or questions, contact your health care provider. What can I expect after the procedure? After the procedure, it is common to have:  A small amount of blood in your stool for 24 hours after the procedure.  Some gas.  Mild cramping or bloating of your abdomen. Follow these instructions at home: Eating and drinking  Drink enough fluid to keep your urine pale yellow.  Follow instructions from your health care provider about eating or drinking restrictions.  Resume your normal diet as instructed  by your health care provider. Avoid heavy or fried foods that are hard to digest.   Activity  Rest as told by your health care provider.  Avoid sitting for a long time without moving. Get up to take short walks every 1-2 hours. This is important to improve blood flow and breathing. Ask for help if you feel weak or unsteady.  Return to your normal activities as told by your health care provider. Ask your health care provider what activities are safe for you. Managing cramping and bloating  Try walking around when you have cramps or feel bloated.  Apply heat to  your abdomen as told by your health care provider. Use the heat source that your health care provider recommends, such as a moist heat pack or a heating pad. ? Place a towel between your skin and the heat source. ? Leave the heat on for 20-30 minutes. ? Remove the heat if your skin turns bright red. This is especially important if you are unable to feel pain, heat, or cold. You may have a greater risk of getting burned.   General instructions  If you were given a sedative during the procedure, it can affect you for several hours. Do not drive or operate machinery until your health care provider says that it is safe.  For the first 24 hours after the procedure: ? Do not sign important documents. ? Do not drink alcohol. ? Do your regular daily activities at a slower pace than normal. ? Eat soft foods that are easy to digest.  Take over-the-counter and prescription medicines only as told by your health care provider.  Keep all follow-up visits as told by your health care provider. This is important. Contact a health care provider if:  You have blood in your stool 2-3 days after the procedure. Get help right away if you have:  More than a small spotting of blood in your stool.  Large blood clots in your stool.  Swelling of your abdomen.  Nausea or vomiting.  A fever.  Increasing pain in your abdomen that is not relieved  with medicine. Summary  After the procedure, it is common to have a small amount of blood in your stool. You may also have mild cramping and bloating of your abdomen.  If you were given a sedative during the procedure, it can affect you for several hours. Do not drive or operate machinery until your health care provider says that it is safe.  Get help right away if you have a lot of blood in your stool, nausea or vomiting, a fever, or increased pain in your abdomen. This information is not intended to replace advice given to you by your health care provider. Make sure you discuss any questions you have with your health care provider. Document Revised: 11/07/2019 Document Reviewed: 06/09/2019 Elsevier Patient Education  2021 Elsevier Inc. Monitored Anesthesia Care, Care After This sheet gives you information about how to care for yourself after your procedure. Your health care provider may also give you more specific instructions. If you have problems or questions, contact your health care provider. What can I expect after the procedure? After the procedure, it is common to have:  Tiredness.  Forgetfulness about what happened after the procedure.  Impaired judgment for important decisions.  Nausea or vomiting.  Some difficulty with balance. Follow these instructions at home: For the time period you were told by your health care provider:  Rest as needed.  Do not participate in activities where you could fall or become injured.  Do not drive or use machinery.  Do not drink alcohol.  Do not take sleeping pills or medicines that cause drowsiness.  Do not make important decisions or sign legal documents.  Do not take care of children on your own.      Eating and drinking  Follow the diet that is recommended by your health care provider.  Drink enough fluid to keep your urine pale yellow.  If you vomit: ? Drink water, juice, or soup when you can drink without  vomiting. ? Make sure you have little or no nausea  before eating solid foods. General instructions  Have a responsible adult stay with you for the time you are told. It is important to have someone help care for you until you are awake and alert.  Take over-the-counter and prescription medicines only as told by your health care provider.  If you have sleep apnea, surgery and certain medicines can increase your risk for breathing problems. Follow instructions from your health care provider about wearing your sleep device: ? Anytime you are sleeping, including during daytime naps. ? While taking prescription pain medicines, sleeping medicines, or medicines that make you drowsy.  Avoid smoking.  Keep all follow-up visits as told by your health care provider. This is important. Contact a health care provider if:  You keep feeling nauseous or you keep vomiting.  You feel light-headed.  You are still sleepy or having trouble with balance after 24 hours.  You develop a rash.  You have a fever.  You have redness or swelling around the IV site. Get help right away if:  You have trouble breathing.  You have new-onset confusion at home. Summary  For several hours after your procedure, you may feel tired. You may also be forgetful and have poor judgment.  Have a responsible adult stay with you for the time you are told. It is important to have someone help care for you until you are awake and alert.  Rest as told. Do not drive or operate machinery. Do not drink alcohol or take sleeping pills.  Get help right away if you have trouble breathing, or if you suddenly become confused. This information is not intended to replace advice given to you by your health care provider. Make sure you discuss any questions you have with your health care provider. Document Revised: 07/29/2020 Document Reviewed: 10/16/2019 Elsevier Patient Education  2021 [employer]

## 2021-01-21 ENCOUNTER — Other Ambulatory Visit (HOSPITAL_COMMUNITY)
Admission: RE | Admit: 2021-01-21 | Discharge: 2021-01-21 | Disposition: A | Discharge status: Home / Self Care | Payer: Commercial Managed Care - PPO | Source: Ambulatory Visit | Attending: Internal Medicine | Admitting: Internal Medicine

## 2021-01-21 ENCOUNTER — Other Ambulatory Visit: Payer: Self-pay

## 2021-01-21 ENCOUNTER — Encounter (HOSPITAL_COMMUNITY)
Admission: RE | Admit: 2021-01-21 | Discharge: 2021-01-21 | Disposition: A | Discharge status: Home / Self Care | Payer: Commercial Managed Care - PPO | Source: Ambulatory Visit | Attending: Internal Medicine | Admitting: Internal Medicine

## 2021-01-21 ENCOUNTER — Encounter (HOSPITAL_COMMUNITY): Payer: Self-pay

## 2021-01-21 DIAGNOSIS — Z20822 Contact with and (suspected) exposure to covid-19: Secondary | ICD-10-CM | POA: Diagnosis not present

## 2021-01-21 DIAGNOSIS — Z01812 Encounter for preprocedural laboratory examination: Secondary | ICD-10-CM | POA: Insufficient documentation

## 2021-01-21 HISTORY — DX: Gastro-esophageal reflux disease without esophagitis: K21.9

## 2021-01-22 LAB — SARS CORONAVIRUS 2 (TAT 6-24 HRS): SARS Coronavirus 2: NEGATIVE

## 2021-01-24 ENCOUNTER — Ambulatory Visit (HOSPITAL_COMMUNITY): Payer: Commercial Managed Care - PPO | Admitting: Anesthesiology

## 2021-01-24 ENCOUNTER — Encounter (HOSPITAL_COMMUNITY)
Admission: RE | Disposition: A | Discharge status: Home / Self Care | Payer: Self-pay | Source: Home / Self Care | Attending: Internal Medicine

## 2021-01-24 ENCOUNTER — Ambulatory Visit (HOSPITAL_COMMUNITY)
Admission: RE | Admit: 2021-01-24 | Discharge: 2021-01-24 | Disposition: A | Discharge status: Home / Self Care | Payer: Commercial Managed Care - PPO | Attending: Internal Medicine | Admitting: Internal Medicine

## 2021-01-24 DIAGNOSIS — K3189 Other diseases of stomach and duodenum: Secondary | ICD-10-CM | POA: Diagnosis not present

## 2021-01-24 DIAGNOSIS — K319 Disease of stomach and duodenum, unspecified: Secondary | ICD-10-CM | POA: Insufficient documentation

## 2021-01-24 DIAGNOSIS — Z79890 Hormone replacement therapy (postmenopausal): Secondary | ICD-10-CM | POA: Diagnosis not present

## 2021-01-24 DIAGNOSIS — Z791 Long term (current) use of non-steroidal anti-inflammatories (NSAID): Secondary | ICD-10-CM | POA: Diagnosis not present

## 2021-01-24 DIAGNOSIS — K2950 Unspecified chronic gastritis without bleeding: Secondary | ICD-10-CM | POA: Diagnosis not present

## 2021-01-24 DIAGNOSIS — F17210 Nicotine dependence, cigarettes, uncomplicated: Secondary | ICD-10-CM | POA: Diagnosis not present

## 2021-01-24 DIAGNOSIS — I2510 Atherosclerotic heart disease of native coronary artery without angina pectoris: Secondary | ICD-10-CM | POA: Insufficient documentation

## 2021-01-24 DIAGNOSIS — K259 Gastric ulcer, unspecified as acute or chronic, without hemorrhage or perforation: Secondary | ICD-10-CM | POA: Insufficient documentation

## 2021-01-24 DIAGNOSIS — Z881 Allergy status to other antibiotic agents status: Secondary | ICD-10-CM | POA: Diagnosis not present

## 2021-01-24 DIAGNOSIS — Z7982 Long term (current) use of aspirin: Secondary | ICD-10-CM | POA: Diagnosis not present

## 2021-01-24 DIAGNOSIS — Z79899 Other long term (current) drug therapy: Secondary | ICD-10-CM | POA: Diagnosis not present

## 2021-01-24 DIAGNOSIS — K5730 Diverticulosis of large intestine without perforation or abscess without bleeding: Secondary | ICD-10-CM | POA: Insufficient documentation

## 2021-01-24 DIAGNOSIS — Z880 Allergy status to penicillin: Secondary | ICD-10-CM | POA: Diagnosis not present

## 2021-01-24 DIAGNOSIS — R1310 Dysphagia, unspecified: Secondary | ICD-10-CM | POA: Diagnosis not present

## 2021-01-24 DIAGNOSIS — Z1211 Encounter for screening for malignant neoplasm of colon: Secondary | ICD-10-CM | POA: Insufficient documentation

## 2021-01-24 DIAGNOSIS — K219 Gastro-esophageal reflux disease without esophagitis: Secondary | ICD-10-CM | POA: Diagnosis not present

## 2021-01-24 HISTORY — PX: MALONEY DILATION: SHX5535

## 2021-01-24 HISTORY — PX: BIOPSY: SHX5522

## 2021-01-24 HISTORY — PX: COLONOSCOPY WITH PROPOFOL: SHX5780

## 2021-01-24 HISTORY — PX: ESOPHAGOGASTRODUODENOSCOPY (EGD) WITH PROPOFOL: SHX5813

## 2021-01-24 SURGERY — COLONOSCOPY WITH PROPOFOL
Anesthesia: General

## 2021-01-24 MED ORDER — MIDAZOLAM HCL 2 MG/2ML IJ SOLN
INTRAMUSCULAR | Status: AC
  Filled 2021-01-24: qty 2

## 2021-01-24 MED ORDER — PHENYLEPHRINE HCL (PRESSORS) 10 MG/ML IV SOLN
INTRAVENOUS | Status: DC | PRN
  Administered 2021-01-24 (×8): 100 ug via INTRAVENOUS

## 2021-01-24 MED ORDER — PROPOFOL 500 MG/50ML IV EMUL
INTRAVENOUS | Status: DC | PRN
  Administered 2021-01-24: 150 ug/kg/min via INTRAVENOUS

## 2021-01-24 MED ORDER — LIDOCAINE VISCOUS HCL 2 % MT SOLN
OROMUCOSAL | Status: AC
  Filled 2021-01-24: qty 15

## 2021-01-24 MED ORDER — MIDAZOLAM HCL 2 MG/2ML IJ SOLN
2.0000 mg | Freq: Once | INTRAMUSCULAR | Status: AC
  Administered 2021-01-24: 2 mg via INTRAVENOUS

## 2021-01-24 MED ORDER — PROPOFOL 10 MG/ML IV BOLUS
INTRAVENOUS | Status: AC
  Filled 2021-01-24: qty 40

## 2021-01-24 MED ORDER — PROPOFOL 10 MG/ML IV BOLUS
INTRAVENOUS | Status: DC | PRN
  Administered 2021-01-24: 50 mg via INTRAVENOUS

## 2021-01-24 MED ORDER — LIDOCAINE VISCOUS HCL 2 % MT SOLN
15.0000 mL | Freq: Once | OROMUCOSAL | Status: AC
  Administered 2021-01-24: 15 mL via OROMUCOSAL

## 2021-01-24 MED ORDER — LIDOCAINE HCL (PF) 2 % IJ SOLN
INTRAMUSCULAR | Status: AC
  Filled 2021-01-24: qty 5

## 2021-01-24 MED ORDER — METOPROLOL TARTRATE 5 MG/5ML IV SOLN
INTRAVENOUS | Status: AC
  Filled 2021-01-24: qty 5

## 2021-01-24 MED ORDER — PHENYLEPHRINE 40 MCG/ML (10ML) SYRINGE FOR IV PUSH (FOR BLOOD PRESSURE SUPPORT)
PREFILLED_SYRINGE | INTRAVENOUS | Status: AC
  Filled 2021-01-24: qty 20

## 2021-01-24 MED ORDER — LACTATED RINGERS IV SOLN: INTRAVENOUS | Status: DC

## 2021-01-24 NOTE — Anesthesia Postprocedure Evaluation (Signed)
Anesthesia Post Note  Patient: Bill Castro  Procedure(s) Performed: COLONOSCOPY WITH PROPOFOL (N/A ) ESOPHAGOGASTRODUODENOSCOPY (EGD) WITH PROPOFOL (N/A ) MALONEY DILATION (N/A ) BIOPSY  Patient location during evaluation: PACU Anesthesia Type: General Level of consciousness: awake and patient cooperative Pain management: pain level controlled Vital Signs Assessment: post-procedure vital signs reviewed and stable Respiratory status: spontaneous breathing, nonlabored ventilation and respiratory function stable Cardiovascular status: stable Postop Assessment: no apparent nausea or vomiting Anesthetic complications: no   No complications documented.   Last Vitals:  Vitals:   01/24/21 0645 01/24/21 0700  BP: 119/66 114/75  Pulse: (!) 102 96  Resp: 14 20  Temp: 37.1 C   SpO2: 98% 97%    Last Pain:  Vitals:   01/24/21 0740  PainSc: 0-No pain                 Dhrithi Riche

## 2021-01-24 NOTE — Anesthesia Preprocedure Evaluation (Addendum)
Anesthesia Evaluation  Patient identified by MRN, date of birth, ID band Patient awake    Reviewed: Allergy & Precautions, NPO status , Patient's Chart, lab work & pertinent test results  History of Anesthesia Complications (+) Family history of anesthesia reaction and history of anesthetic complications (family h/o PONV)  Airway Mallampati: III  TM Distance: >3 FB Neck ROM: Full    Dental  (+) Dental Advisory Given, Caps Lower left temporary crown:   Pulmonary Current Smoker and Patient abstained from smoking.,    Pulmonary exam normal breath sounds clear to auscultation       Cardiovascular Exercise Tolerance: Good hypertension, Pt. on medications + CAD, +CHF and + DOE  Normal cardiovascular exam+ dysrhythmias (complete heart block) + pacemaker (complete heart block)  Rhythm:Regular Rate:Normal  a. cath in 09/2019 showing 60% mid/distal LAD stenosis and 80 to 90% distal RPDA stenosis with medical management recommended.  a. EF 30-35% in 2003 with cath showing normal cors, EF normalized by repeat imaging   Neuro/Psych PSYCHIATRIC DISORDERS Anxiety Depression    GI/Hepatic Neg liver ROS, GERD  ,  Endo/Other  Hypothyroidism   Renal/GU negative Renal ROS     Musculoskeletal  (+) Arthritis  (BACK SX),   Abdominal   Peds  Hematology negative hematology ROS (+)   Anesthesia Other Findings   Reproductive/Obstetrics negative OB ROS                           Anesthesia Physical Anesthesia Plan  ASA: III  Anesthesia Plan: General   Post-op Pain Management:    Induction: Intravenous  PONV Risk Score and Plan: TIVA  Airway Management Planned: Nasal Cannula and Natural Airway  Additional Equipment:   Intra-op Plan:   Post-operative Plan:   Informed Consent: I have reviewed the patients History and Physical, chart, labs and discussed the procedure including the risks, benefits and  alternatives for the proposed anesthesia with the patient or authorized representative who has indicated his/her understanding and acceptance.     Dental advisory given  Plan Discussed with: CRNA and Surgeon  Anesthesia Plan Comments:         Anesthesia Quick Evaluation

## 2021-01-24 NOTE — Discharge Instructions (Signed)
Colonoscopy Discharge Instructions  Read the instructions outlined below and refer to this sheet in the next few weeks. These discharge instructions provide you with general information on caring for yourself after you leave the hospital. Your doctor may also give you specific instructions. While your treatment has been planned according to the most current medical practices available, unavoidable complications occasionally occur. If you have any problems or questions after discharge, call Dr. Gala Romney at [phone]. ACTIVITY  You may resume your regular activity, but move at a slower pace for the next 24 hours.   Take frequent rest periods for the next 24 hours.   Walking will help get rid of the air and reduce the bloated feeling in your belly (abdomen).   No driving for 24 hours (because of the medicine (anesthesia) used during the test).    Do not sign any important legal documents or operate any machinery for 24 hours (because of the anesthesia used during the test).  NUTRITION  Drink plenty of fluids.   You may resume your normal diet as instructed by your doctor.   Begin with a light meal and progress to your normal diet. Heavy or fried foods are harder to digest and may make you feel sick to your stomach (nauseated).   Avoid alcoholic beverages for 24 hours or as instructed.  MEDICATIONS  You may resume your normal medications unless your doctor tells you otherwise.  WHAT YOU CAN EXPECT TODAY  Some feelings of bloating in the abdomen.   Passage of more gas than usual.   Spotting of blood in your stool or on the toilet paper.  IF YOU HAD POLYPS REMOVED DURING THE COLONOSCOPY:  No aspirin products for 7 days or as instructed.   No alcohol for 7 days or as instructed.   Eat a soft diet for the next 24 hours.  FINDING OUT THE RESULTS OF YOUR TEST Not all test results are available during your visit. If your test results are not back during the visit, make an appointment  with your caregiver to find out the results. Do not assume everything is normal if you have not heard from your caregiver or the medical facility. It is important for you to follow up on all of your test results.  SEEK IMMEDIATE MEDICAL ATTENTION IF:  You have more than a spotting of blood in your stool.   Your belly is swollen (abdominal distention).   You are nauseated or vomiting.   You have a temperature over 101.   You have abdominal pain or discomfort that is severe or gets worse throughout the day.   EGD Discharge instructions Please read the instructions outlined below and refer to this sheet in the next few weeks. These discharge instructions provide you with general information on caring for yourself after you leave the hospital. Your doctor may also give you specific instructions. While your treatment has been planned according to the most current medical practices available, unavoidable complications occasionally occur. If you have any problems or questions after discharge, please call your doctor. ACTIVITY  You may resume your regular activity but move at a slower pace for the next 24 hours.   Take frequent rest periods for the next 24 hours.   Walking will help expel (get rid of) the air and reduce the bloated feeling in your abdomen.   No driving for 24 hours (because of the anesthesia (medicine) used during the test).   You may shower.   Do not sign any  important legal documents or operate any machinery for 24 hours (because of the anesthesia used during the test).  NUTRITION  Drink plenty of fluids.   You may resume your normal diet.   Begin with a light meal and progress to your normal diet.   Avoid alcoholic beverages for 24 hours or as instructed by your caregiver.  MEDICATIONS  You may resume your normal medications unless your caregiver tells you otherwise.  WHAT YOU CAN EXPECT TODAY  You may experience abdominal discomfort such as a feeling of  fullness or "gas" pains.  FOLLOW-UP  Your doctor will discuss the results of your test with you.  SEEK IMMEDIATE MEDICAL ATTENTION IF ANY OF THE FOLLOWING OCCUR:  Excessive nausea (feeling sick to your stomach) and/or vomiting.   Severe abdominal pain and distention (swelling).   Trouble swallowing.   Temperature over 101 F (37.8 C).   Rectal bleeding or vomiting of blood.    Your stomach was a little bit inflamed today. Biopsies taken.  Your esophagus appeared normal. I did perform a dilation today.  Trial of omeprazole 20 mg daily-prescription provided  Your colon looked good except for scattered diverticulosis  It is recommended you return for 1 more colonoscopy in 10 years for screening purposes  Office visit scheduled with [place] in 6 weeks  Patient request, I spoke to O'Donnell at [phone]    Omeprazole Sprinkle Capsules What is this medicine? OMEPRAZOLE (oh ME pray zol) prevents the production of acid in the stomach. It is used to treat gastroesophageal reflux disease (GERD), ulcers, certain bacteria in the stomach, inflammation of the esophagus, and Zollinger-Ellison Syndrome. It is also used to treat other conditions that cause too much stomach acid. This medicine may be used for other purposes; ask your health care provider or pharmacist if you have questions. COMMON BRAND NAME(S): Prilosec What should I tell my health care provider before I take this medicine? They need to know if you have any of these conditions:  liver disease  low levels of magnesium in the blood  lupus  an unusual or allergic reaction to omeprazole, other medicines, foods, dyes, or preservatives  pregnant or trying to get pregnant  breast-feeding How should I use this medicine? Take this medicine by mouth with a glass of water. Follow the directions on the prescription label. Do not cut, crush or chew this medicine. Swallow the capsules whole. You may open the capsule and put the  contents in 1 tablespoon of applesauce. Swallow the medicine and applesauce right away. Do not chew the medicine or applesauce. Take this medicine before a meal. Take your medicine at regular intervals. Do not take your medicine more often than directed. Do not stop taking except on your doctor's advice. A special MedGuide will be given to you by the pharmacist with each prescription and refill. Be sure to read this information carefully each time. Talk to your pediatrician regarding the use of this medicine in children. While this drug may be prescribed for children as young as 2 years for selected conditions, precautions do apply. Overdosage: If you think you have taken too much of this medicine contact a poison control center or emergency room at once. NOTE: This medicine is only for you. Do not share this medicine with others. What if I miss a dose? If you miss a dose, take it as soon as you can. If it is almost time for your next dose, take only that dose. Do not take double or extra  doses. What may interact with this medicine? Do not take this medicine with any of the following medications:  atazanavir  clopidogrel  nelfinavir  rilpivirine This medicine may also interact with the following medications:  antifungals like itraconazole, ketoconazole, and voriconazole  certain antivirals for HIV or hepatitis  certain medicines that treat or prevent blood clots like warfarin  cilostazol  citalopram  cyclosporine  dasatinib  digoxin  disulfiram  diuretics  erlotinib  iron supplements  medicines for anxiety, panic, and sleep like diazepam  medicines for seizures like carbamazepine, phenobarbital, phenytoin  methotrexate  mycophenolate mofetil  nilotinib  rifampin  St. John's wort  tacrolimus  vitamin B12 This list may not describe all possible interactions. Give your health care provider a list of all the medicines, herbs, non-prescription drugs, or dietary  supplements you use. Also tell them if you smoke, drink alcohol, or use illegal drugs. Some items may interact with your medicine. What should I watch for while using this medicine? Visit your healthcare professional for regular checks on your progress. Tell your healthcare professional if your symptoms do not start to get better or if they get worse. You may need blood work done while taking this medicine. This medicine may cause a decrease in vitamin B12. You should make sure that you get enough vitamin B12 while you are taking this medicine. Discuss the foods you eat and the vitamins you take with your health care professional. What side effects may I notice from receiving this medicine? Side effects that you should report to your doctor or health care professional as soon as possible:  allergic reactions like skin rash, itching or hives, swelling of the face, lips, or tongue  bone pain  breathing problems  fever or sore throat  joint pain  rash on cheeks or arms that gets worse in the sun  redness, blistering, peeling, or loosening of the skin, including inside the mouth  severe diarrhea  signs and symptoms of kidney injury like trouble passing urine or change in the amount of urine  signs and symptoms of low magnesium like muscle cramps; muscle pain; muscle weakness; tremors; seizures; or fast, irregular heartbeat  stomach polyps  unusual bleeding or bruising Side effects that usually do not require medical attention (report to your doctor or health care professional if they continue or are bothersome):  diarrhea  dry mouth  gas  headache  nausea  stomach pain This list may not describe all possible side effects. Call your doctor for medical advice about side effects. You may report side effects to FDA at 1-800-FDA-1088. Where should I keep my medicine? Keep out of the reach of children. Store at room temperature between 15 and 30 degrees C (59 and 86 degrees F).  Protect from light and moisture. Throw away any unused medicine after the expiration date. NOTE: This sheet is a summary. It may not cover all possible information. If you have questions about this medicine, talk to your doctor, pharmacist, or health care provider.  2021 Elsevier/Gold Standard (2020-09-22 18:32:47)     Monitored Anesthesia Care, Care After This sheet gives you information about how to care for yourself after your procedure. Your health care provider may also give you more specific instructions. If you have problems or questions, contact your health care provider. What can I expect after the procedure? After the procedure, it is common to have:  Tiredness.  Forgetfulness about what happened after the procedure.  Impaired judgment for important decisions.  Nausea or  vomiting.  Some difficulty with balance. Follow these instructions at home: For the time period you were told by your health care provider:  Rest as needed.  Do not participate in activities where you could fall or become injured.  Do not drive or use machinery.  Do not drink alcohol.  Do not take sleeping pills or medicines that cause drowsiness.  Do not make important decisions or sign legal documents.  Do not take care of children on your own.      Eating and drinking  Follow the diet that is recommended by your health care provider.  Drink enough fluid to keep your urine pale yellow.  If you vomit: ? Drink water, juice, or soup when you can drink without vomiting. ? Make sure you have little or no nausea before eating solid foods. General instructions  Have a responsible adult stay with you for the time you are told. It is important to have someone help care for you until you are awake and alert.  Take over-the-counter and prescription medicines only as told by your health care provider.  If you have sleep apnea, surgery and certain medicines can increase your risk for breathing  problems. Follow instructions from your health care provider about wearing your sleep device: ? Anytime you are sleeping, including during daytime naps. ? While taking prescription pain medicines, sleeping medicines, or medicines that make you drowsy.  Avoid smoking.  Keep all follow-up visits as told by your health care provider. This is important. Contact a health care provider if:  You keep feeling nauseous or you keep vomiting.  You feel light-headed.  You are still sleepy or having trouble with balance after 24 hours.  You develop a rash.  You have a fever.  You have redness or swelling around the IV site. Get help right away if:  You have trouble breathing.  You have new-onset confusion at home. Summary  For several hours after your procedure, you may feel tired. You may also be forgetful and have poor judgment.  Have a responsible adult stay with you for the time you are told. It is important to have someone help care for you until you are awake and alert.  Rest as told. Do not drive or operate machinery. Do not drink alcohol or take sleeping pills.  Get help right away if you have trouble breathing, or if you suddenly become confused. This information is not intended to replace advice given to you by your health care provider. Make sure you discuss any questions you have with your health care provider. Document Revised: 07/29/2020 Document Reviewed: 10/16/2019 Elsevier Patient Education  2021 [employer].
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
## 2021-01-24 NOTE — Transfer of Care (Signed)
Immediate Anesthesia Transfer of Care Note  Patient: Bill Castro  Procedure(s) Performed: COLONOSCOPY WITH PROPOFOL (N/A ) ESOPHAGOGASTRODUODENOSCOPY (EGD) WITH PROPOFOL (N/A ) MALONEY DILATION (N/A ) BIOPSY  Patient Location: PACU  Anesthesia Type:General  Level of Consciousness: awake, drowsy and patient cooperative  Airway & Oxygen Therapy: Patient Spontanous Breathing  Post-op Assessment: Report given to RN, Post -op Vital signs reviewed and stable and Patient moving all extremities X 4  Post vital signs: Reviewed and stable  Last Vitals:  Vitals Value Taken Time  BP 85/53 01/24/21 0816  Temp    Pulse 83 01/24/21 0817  Resp 16 01/24/21 0817  SpO2 93 % 01/24/21 0817  Vitals shown include unvalidated device data.  Last Pain:  Vitals:   01/24/21 0740  PainSc: 0-No pain         Complications: No complications documented.

## 2021-01-24 NOTE — Op Note (Signed)
[facility] Patient Name: Bill Castro Procedure Date: 01/24/2021 7:09 AM MRN: [id number] Date of Birth: 1961-01-19 Attending MD: Gennette Pac , MD CSN: [id number] Age: 60 Admit Type: Outpatient Procedure:                Upper GI endoscopy Indications:              Dysphagia, Heartburn Providers:                Gennette Pac, MD, Brain Hilts, RN, Dyann Ruddle Referring MD:              Medicines:                Propofol per Anesthesia Complications:            No immediate complications. Estimated Blood Loss:     Estimated blood loss was minimal. Procedure:                Pre-Anesthesia Assessment:                           - Prior to the procedure, a History and Physical                            was performed, and patient medications and                            allergies were reviewed. The patient's tolerance of                            previous anesthesia was also reviewed. The risks                            and benefits of the procedure and the sedation                            options and risks were discussed with the patient.                            All questions were answered, and informed consent                            was obtained. Prior Anticoagulants: The patient has                            taken no previous anticoagulant or antiplatelet                            agents. ASA Grade Assessment: III - A patient with                            severe systemic disease. After reviewing the risks  and benefits, the patient was deemed in                            satisfactory condition to undergo the procedure.                           After obtaining informed consent, the endoscope was                            passed under direct vision. Throughout the                            procedure, the patient's blood pressure, pulse, and                            oxygen saturations were  monitored continuously. The                            [id number]-[id number] ([id number]) scope was introduced through the                            mouth, and advanced to the second part of duodenum.                            The upper GI endoscopy was accomplished without                            difficulty. The patient tolerated the procedure                            well. Scope In: 7:44:47 AM Scope Out: 7:50:23 AM Total Procedure Duration: 0 hours 5 minutes 36 seconds  Findings:      The examined esophagus was normal.      Multiple erosions with no stigmata of recent bleeding were found in the       entire examined stomach (greater towards the antrum). Ulcer or       infiltrating process seen. Patent pylorus.      The duodenal bulb and second portion of the duodenum were normal. The       scope was withdrawn. Dilation was performed with a Maloney dilator with       no resistance at 56 Fr. The dilation site was examined following       endoscope reinsertion and showed no change. Estimated blood loss: none.       Finally, the abnormal gastric mucosa was biopsied with a cold forceps       for histology. Estimated blood loss was minimal. Impression:               - Normal esophagus. Dilated.                           - Erosive gastropathy with no stigmata of recent                            bleeding. Biopsied.                           -  Normal duodenal bulb and second portion of the                            duodenum. Moderate Sedation:      Moderate (conscious) sedation was personally administered by an       anesthesia professional. The following parameters were monitored: oxygen       saturation, heart rate, blood pressure, respiratory rate, EKG, adequacy       of pulmonary ventilation, and response to care. Recommendation:           - Patient has a contact number available for                            emergencies. The signs and symptoms of potential                            delayed  complications were discussed with the                            patient. Return to normal activities tomorrow.                            Written discharge instructions were provided to the                            patient.                           - Resume previous diet.                           - Continue present medications. Trial of omeprazole                            20 mg daily. Follow-up on pathology.                           - Return to my office in 6 weeks. Procedure Code(s):        --- Professional ---                           [phone], Esophagogastroduodenoscopy, flexible,                            transoral; with biopsy, single or multiple                           43450, Dilation of esophagus, by unguided sound or                            bougie, single or multiple passes Diagnosis Code(s):        --- Professional ---                           K31.89, Other diseases of stomach and duodenum  R13.10, Dysphagia, unspecified                           R12, Heartburn CPT copyright 2019 American [employer] Association. All rights reserved. The codes documented in this report are preliminary and upon coder review may  be revised to meet current compliance requirements. Gerrit Friendsobert M. Griffin Dewilde, MD Gennette Pacobert Michael Jakeya Gherardi, MD 01/24/2021 8:16:36 AM This report has been signed electronically. Number of Addenda: 0

## 2021-01-24 NOTE — Op Note (Signed)
[facility] Patient Name: Bill Castro Procedure Date: 01/24/2021 7:08 AM MRN: [id number] Date of Birth: 1961-11-24 Attending MD: Gennette Pac , MD CSN: [id number] Age: 60 Admit Type: Outpatient Procedure:                Colonoscopy Indications:              Screening for colorectal malignant neoplasm Providers:                Gennette Pac, MD, Brain Hilts, RN, Dyann Ruddle Referring MD:              Medicines:                Propofol per Anesthesia Complications:            No immediate complications. Estimated Blood Loss:     Estimated blood loss: none. Procedure:                Pre-Anesthesia Assessment:                           - Prior to the procedure, a History and Physical                            was performed, and patient medications and                            allergies were reviewed. The patient's tolerance of                            previous anesthesia was also reviewed. The risks                            and benefits of the procedure and the sedation                            options and risks were discussed with the patient.                            All questions were answered, and informed consent                            was obtained. Prior Anticoagulants: The patient has                            taken no previous anticoagulant or antiplatelet                            agents. ASA Grade Assessment: III - A patient with                            severe systemic disease. After reviewing the risks  and benefits, the patient was deemed in                            satisfactory condition to undergo the procedure.                           After obtaining informed consent, the colonoscope                            was passed under direct vision. Throughout the                            procedure, the patient's blood pressure, pulse, and                            oxygen  saturations were monitored continuously. The                            [id number] ([id number] scope was introduced through                            the anus and advanced to the the cecum, identified                            by appendiceal orifice and ileocecal valve. The                            colonoscopy was performed without difficulty. The                            patient tolerated the procedure well. The quality                            of the bowel preparation was adequate. Scope In: 7:55:54 AM Scope Out: 8:09:50 AM Scope Withdrawal Time: 0 hours 9 minutes 28 seconds  Total Procedure Duration: 0 hours 13 minutes 56 seconds  Findings:      The perianal and digital rectal examinations were normal.      Scattered small-mouthed diverticula were found in the sigmoid colon.      The exam was otherwise without abnormality on direct and retroflexion       views. Impression:               - Diverticulosis in the sigmoid colon.                           - The examination was otherwise normal on direct                            and retroflexion views.                           - No specimens collected. Moderate Sedation:      Moderate (conscious) sedation was personally administered by an       anesthesia professional. The following parameters were monitored: oxygen  saturation, heart rate, blood pressure, respiratory rate, EKG, adequacy       of pulmonary ventilation, and response to care. Recommendation:           - Patient has a contact number available for                            emergencies. The signs and symptoms of potential                            delayed complications were discussed with the                            patient. Return to normal activities tomorrow.                            Written discharge instructions were provided to the                            patient.                           - Resume previous diet.                           -  Continue present medications.                           - Repeat colonoscopy in 10 years for screening                            purposes.                           - Return to GI office in 6 weeks. See EGD report. Procedure Code(s):        --- Professional ---                           [phone], Colonoscopy, flexible; diagnostic, including                            collection of specimen(s) by brushing or washing,                            when performed (separate procedure) Diagnosis Code(s):        --- Professional ---                           Z12.11, Encounter for screening for malignant                            neoplasm of colon                           K57.30, Diverticulosis of large intestine without  perforation or abscess without bleeding CPT copyright 2019 American Medical Association. All rights reserved. The codes documented in this report are preliminary and upon coder review may  be revised to meet current compliance requirements. Gerrit Friends. Constanza Mincy, MD Gennette Pac, MD 01/24/2021 8:20:29 AM This report has been signed electronically. Number of Addenda: 0

## 2021-01-25 LAB — SURGICAL PATHOLOGY

## 2021-01-26 ENCOUNTER — Encounter: Payer: Self-pay | Admitting: Internal Medicine

## 2021-01-26 NOTE — Telephone Encounter (Signed)
I would stick with the same dose of metoprol. The heartrate normally should go up in certain situations, including anxiety, pain, exertion etc. The goal of the metoprolol is not to prevent that from happening as these are normal physiological responses. If your heart rate were elevated to 109 due to an abnormal heart rhythm then yes we would increase the metoprolol, but would not increase the medicine for normal heart rhythm with just an elevated heart rate.   Dina Rich MD

## 2021-01-27 ENCOUNTER — Encounter (HOSPITAL_COMMUNITY): Payer: Self-pay | Admitting: Internal Medicine

## 2021-03-03 ENCOUNTER — Ambulatory Visit (INDEPENDENT_AMBULATORY_CARE_PROVIDER_SITE_OTHER): Payer: Commercial Managed Care - PPO

## 2021-03-03 DIAGNOSIS — I442 Atrioventricular block, complete: Secondary | ICD-10-CM | POA: Diagnosis not present

## 2021-03-03 LAB — CUP PACEART REMOTE DEVICE CHECK
Battery Remaining Longevity: 144 mo
Battery Voltage: 2.96 V
Brady Statistic AP VP Percent: 1.49 %
Brady Statistic AP VS Percent: 1.94 %
Brady Statistic AS VP Percent: 0.03 %
Brady Statistic AS VS Percent: 96.53 %
Brady Statistic RA Percent Paced: 3.51 %
Brady Statistic RV Percent Paced: 1.53 %
Date Time Interrogation Session: 20220407003547
Implantable Lead Implant Date: 20190123
Implantable Lead Implant Date: 20190123
Implantable Lead Location: 753859
Implantable Lead Location: 753860
Implantable Lead Model: 5076
Implantable Lead Model: 5076
Implantable Pulse Generator Implant Date: 20190123
Lead Channel Impedance Value: 304 Ohm
Lead Channel Impedance Value: 342 Ohm
Lead Channel Impedance Value: 380 Ohm
Lead Channel Impedance Value: 418 Ohm
Lead Channel Pacing Threshold Amplitude: 0.875 V
Lead Channel Pacing Threshold Amplitude: 1 V
Lead Channel Pacing Threshold Pulse Width: 0.4 ms
Lead Channel Pacing Threshold Pulse Width: 0.4 ms
Lead Channel Sensing Intrinsic Amplitude: 10.25 mV
Lead Channel Sensing Intrinsic Amplitude: 10.25 mV
Lead Channel Sensing Intrinsic Amplitude: 2.375 mV
Lead Channel Sensing Intrinsic Amplitude: 2.375 mV
Lead Channel Setting Pacing Amplitude: 2 V
Lead Channel Setting Pacing Amplitude: 2.5 V
Lead Channel Setting Pacing Pulse Width: 0.4 ms
Lead Channel Setting Sensing Sensitivity: 1.2 mV

## 2021-03-07 ENCOUNTER — Ambulatory Visit: Payer: Commercial Managed Care - PPO | Admitting: Gastroenterology

## 2021-03-11 ENCOUNTER — Ambulatory Visit: Payer: Commercial Managed Care - PPO | Admitting: Cardiology

## 2021-03-15 NOTE — Progress Notes (Signed)
Remote pacemaker transmission.   

## 2021-03-18 ENCOUNTER — Ambulatory Visit (INDEPENDENT_AMBULATORY_CARE_PROVIDER_SITE_OTHER): Payer: Commercial Managed Care - PPO | Admitting: Internal Medicine

## 2021-03-18 ENCOUNTER — Other Ambulatory Visit: Payer: Self-pay

## 2021-03-18 VITALS — BP 122/81 | HR 79 | Temp 96.8°F | Ht 74.0 in | Wt 263.4 lb

## 2021-03-18 DIAGNOSIS — R1310 Dysphagia, unspecified: Secondary | ICD-10-CM | POA: Diagnosis not present

## 2021-03-18 DIAGNOSIS — K219 Gastro-esophageal reflux disease without esophagitis: Secondary | ICD-10-CM

## 2021-03-18 DIAGNOSIS — K5900 Constipation, unspecified: Secondary | ICD-10-CM

## 2021-03-18 NOTE — Patient Instructions (Signed)
Increase omeprazole to 40 mg daily take before supper/evening meal (dispense 90 with 3 refills)  GERD information provided  Consider elevating the head of your bed by 30 degrees  Stay up for least 3 hours after eating your evening meal  Sleeping on your left side will help manage her reflux  Use MiraLAX one half capful every night for constipation  Hold off on Metamucil for now  We may need to adjust the current recommended regimen depending on your progress when you return in 6 weeks  Office visit with me in 6 weeks
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
## 2021-03-25 ENCOUNTER — Ambulatory Visit (HOSPITAL_COMMUNITY)
Admission: EM | Admit: 2021-03-25 | Discharge: 2021-03-25 | Disposition: A | Discharge status: Home / Self Care | Payer: Commercial Managed Care - PPO | Attending: Medical Oncology | Admitting: Medical Oncology

## 2021-03-25 ENCOUNTER — Other Ambulatory Visit: Payer: Self-pay

## 2021-03-25 ENCOUNTER — Encounter (HOSPITAL_COMMUNITY): Payer: Self-pay

## 2021-03-25 DIAGNOSIS — L03818 Cellulitis of other sites: Secondary | ICD-10-CM

## 2021-03-25 MED ORDER — DOXYCYCLINE HYCLATE 100 MG PO CAPS: 100.0000 mg | ORAL_CAPSULE | Freq: Two times a day (BID) | ORAL | 0 refills | Status: DC
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
## 2021-03-25 NOTE — ED Triage Notes (Signed)
Pt presents with abscess in right groin area X 1 week.

## 2021-04-22 ENCOUNTER — Ambulatory Visit: Payer: Commercial Managed Care - PPO | Admitting: Cardiology

## 2021-05-17 ENCOUNTER — Ambulatory Visit: Payer: Commercial Managed Care - PPO | Admitting: Internal Medicine

## 2021-05-27 ENCOUNTER — Ambulatory Visit: Payer: Commercial Managed Care - PPO | Admitting: Family Medicine

## 2021-06-02 ENCOUNTER — Ambulatory Visit (INDEPENDENT_AMBULATORY_CARE_PROVIDER_SITE_OTHER): Payer: Commercial Managed Care - PPO

## 2021-06-02 DIAGNOSIS — I442 Atrioventricular block, complete: Secondary | ICD-10-CM | POA: Diagnosis not present

## 2021-06-02 LAB — CUP PACEART REMOTE DEVICE CHECK
Battery Remaining Longevity: 141 mo
Battery Voltage: 2.96 V
Brady Statistic AP VP Percent: 3.49 %
Brady Statistic AP VS Percent: 2.37 %
Brady Statistic AS VP Percent: 0.04 %
Brady Statistic AS VS Percent: 94.11 %
Brady Statistic RA Percent Paced: 5.93 %
Brady Statistic RV Percent Paced: 3.52 %
Date Time Interrogation Session: 20220707003432
Implantable Lead Implant Date: 20190123
Implantable Lead Implant Date: 20190123
Implantable Lead Location: 753859
Implantable Lead Location: 753860
Implantable Lead Model: 5076
Implantable Lead Model: 5076
Implantable Pulse Generator Implant Date: 20190123
Lead Channel Impedance Value: 304 Ohm
Lead Channel Impedance Value: 380 Ohm
Lead Channel Impedance Value: 380 Ohm
Lead Channel Impedance Value: 418 Ohm
Lead Channel Pacing Threshold Amplitude: 0.75 V
Lead Channel Pacing Threshold Amplitude: 1.125 V
Lead Channel Pacing Threshold Pulse Width: 0.4 ms
Lead Channel Pacing Threshold Pulse Width: 0.4 ms
Lead Channel Sensing Intrinsic Amplitude: 2.125 mV
Lead Channel Sensing Intrinsic Amplitude: 2.125 mV
Lead Channel Sensing Intrinsic Amplitude: 7.875 mV
Lead Channel Sensing Intrinsic Amplitude: 7.875 mV
Lead Channel Setting Pacing Amplitude: 2 V
Lead Channel Setting Pacing Amplitude: 2.5 V
Lead Channel Setting Pacing Pulse Width: 0.4 ms
Lead Channel Setting Sensing Sensitivity: 1.2 mV

## 2021-06-09 ENCOUNTER — Ambulatory Visit (INDEPENDENT_AMBULATORY_CARE_PROVIDER_SITE_OTHER): Payer: Commercial Managed Care - PPO | Admitting: Cardiology

## 2021-06-09 ENCOUNTER — Other Ambulatory Visit: Payer: Self-pay

## 2021-06-09 ENCOUNTER — Encounter: Payer: Self-pay | Admitting: Cardiology

## 2021-06-09 VITALS — BP 110/64 | HR 89 | Ht 73.0 in | Wt 259.4 lb

## 2021-06-09 DIAGNOSIS — R002 Palpitations: Secondary | ICD-10-CM

## 2021-06-09 DIAGNOSIS — Z125 Encounter for screening for malignant neoplasm of prostate: Secondary | ICD-10-CM

## 2021-06-09 DIAGNOSIS — I2510 Atherosclerotic heart disease of native coronary artery without angina pectoris: Secondary | ICD-10-CM | POA: Diagnosis not present

## 2021-06-09 DIAGNOSIS — Z131 Encounter for screening for diabetes mellitus: Secondary | ICD-10-CM | POA: Diagnosis not present

## 2021-06-09 DIAGNOSIS — E782 Mixed hyperlipidemia: Secondary | ICD-10-CM

## 2021-06-09 NOTE — Patient Instructions (Signed)
Medication Instructions:  Your physician recommends that you continue on your current medications as directed. Please refer to the Current Medication list given to you today.  *If you need a refill on your cardiac medications before your next appointment, please call your pharmacy*   Lab Work: CBC CMET TSH A1C LIPIDS MAGNESIUM PSA If you have labs (blood work) drawn today and your tests are completely normal, you will receive your results only by: MyChart Message (if you have MyChart) OR A paper copy in the mail If you have any lab test that is abnormal or we need to change your treatment, we will call you to review the results.   Testing/Procedures: None   Follow-Up: At [facility], you and your health needs are our priority.  As part of our continuing mission to provide you with exceptional heart care, we have created designated Provider Care Teams.  These Care Teams include your primary Cardiologist (physician) and Advanced Practice Providers (APPs -  Physician Assistants and Nurse Practitioners) who all work together to provide you with the care you need, when you need it.  We recommend signing up for the patient portal called "MyChart".  Sign up information is provided on this After Visit Summary.  MyChart is used to connect with patients for Virtual Visits (Telemedicine).  Patients are able to view lab/test results, encounter notes, upcoming appointments, etc.  Non-urgent messages can be sent to your provider as well.   To learn more about what you can do with MyChart, go to [email or web address].    Your next appointment:   6 month(s)  The format for your next appointment:   In Person  Provider:   Dina Rich, MD   Other Instructions
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
## 2021-06-10 ENCOUNTER — Other Ambulatory Visit (HOSPITAL_COMMUNITY)
Admission: RE | Admit: 2021-06-10 | Discharge: 2021-06-10 | Disposition: A | Discharge status: Home / Self Care | Payer: Commercial Managed Care - PPO | Source: Ambulatory Visit | Attending: Cardiology | Admitting: Cardiology

## 2021-06-10 DIAGNOSIS — E782 Mixed hyperlipidemia: Secondary | ICD-10-CM | POA: Diagnosis present

## 2021-06-10 DIAGNOSIS — I2510 Atherosclerotic heart disease of native coronary artery without angina pectoris: Secondary | ICD-10-CM | POA: Insufficient documentation

## 2021-06-10 DIAGNOSIS — Z125 Encounter for screening for malignant neoplasm of prostate: Secondary | ICD-10-CM | POA: Insufficient documentation

## 2021-06-10 DIAGNOSIS — Z131 Encounter for screening for diabetes mellitus: Secondary | ICD-10-CM | POA: Insufficient documentation

## 2021-06-10 LAB — CBC
HCT: 44.2 % (ref 39.0–52.0)
Hemoglobin: 14.8 g/dL (ref 13.0–17.0)
MCH: 33.2 pg (ref 26.0–34.0)
MCHC: 33.5 g/dL (ref 30.0–36.0)
MCV: 99.1 fL (ref 80.0–100.0)
Platelets: 258 10*3/uL (ref 150–400)
RBC: 4.46 MIL/uL (ref 4.22–5.81)
RDW: 12.7 % (ref 11.5–15.5)
WBC: 7.9 10*3/uL (ref 4.0–10.5)
nRBC: 0 % (ref 0.0–0.2)

## 2021-06-10 LAB — COMPREHENSIVE METABOLIC PANEL
ALT: 24 U/L (ref 0–44)
AST: 21 U/L (ref 15–41)
Albumin: 4.3 g/dL (ref 3.5–5.0)
Alkaline Phosphatase: 66 U/L (ref 38–126)
Anion gap: 6 (ref 5–15)
BUN: 10 mg/dL (ref 6–20)
CO2: 23 mmol/L (ref 22–32)
Calcium: 9.1 mg/dL (ref 8.9–10.3)
Chloride: 107 mmol/L (ref 98–111)
Creatinine, Ser: 0.96 mg/dL (ref 0.61–1.24)
GFR, Estimated: >60 mL/min (ref >60)
Glucose, Bld: 113 mg/dL — ABNORMAL HIGH (ref 70–99)
Potassium: 4.2 mmol/L (ref 3.5–5.1)
Sodium: 136 mmol/L (ref 135–145)
Total Bilirubin: 0.4 mg/dL (ref 0.3–1.2)
Total Protein: 6.9 g/dL (ref 6.5–8.1)

## 2021-06-10 LAB — LIPID PANEL
Cholesterol: 131 mg/dL (ref 0–200)
HDL: 46 mg/dL (ref >40)
LDL Cholesterol: 67 mg/dL (ref 0–99)
Total CHOL/HDL Ratio: 2.8 RATIO
Triglycerides: 88 mg/dL (ref <150)
VLDL: 18 mg/dL (ref 0–40)

## 2021-06-10 LAB — PSA: Prostatic Specific Antigen: 0.78 ng/mL (ref 0.00–4.00)

## 2021-06-10 LAB — TSH: TSH: 2.446 u[IU]/mL (ref 0.350–4.500)

## 2021-06-10 LAB — HEMOGLOBIN A1C
Hgb A1c MFr Bld: 5.5 % (ref 4.8–5.6)
Mean Plasma Glucose: 111.15 mg/dL

## 2021-06-10 LAB — MAGNESIUM: Magnesium: 2 mg/dL (ref 1.7–2.4)

## 2021-06-13 ENCOUNTER — Telehealth: Payer: Self-pay | Admitting: *Deleted

## 2021-06-13 NOTE — Telephone Encounter (Signed)
-----   Message from Netta Neat., NP sent at 06/12/2021 11:55 PM EDT ----- Please call the patient and let him know the PSA level is normal. Hemoglobin A1C is normal. Thyroid level is normal. Cholesterol numbers look good. Fasting glucose is slightly above normal. All other chemistry looks good. Blood counts look good .  Netta Neat, NP  06/12/2021 11:54 PM

## 2021-06-13 NOTE — Telephone Encounter (Signed)
Lesle Chris, LPN  0/35/4656  4:11 PM EDT Back to Top     Notified, copy to pcp.

## 2021-06-18 IMAGING — MR MRI THORACIC SPINE WITHOUT CONTRAST
5 of 6 series · 24 of 48 positions shown · non-contrast
Comparison: X-ray 06/10/2019

CLINICAL DATA: Back pain

EXAM:
MRI THORACIC SPINE WITHOUT CONTRAST
TECHNIQUE: Multiplanar, multisequence MR imaging of the thoracic spine was
performed. No intravenous contrast was administered.

[Series 14: T1 · sagittal · 6.0mm · 1.23mm/px · 2 of 9 slices shown (1 of 2)]
[im 1/9]
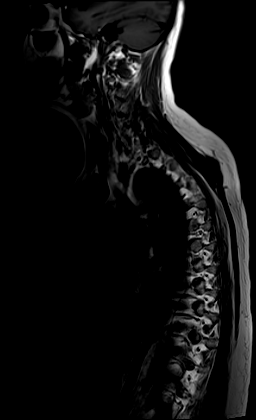
[im 9/9]
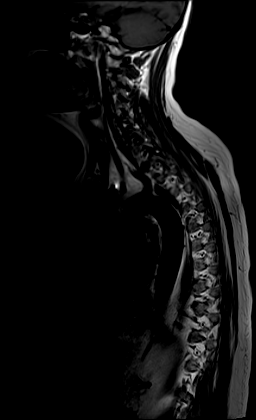

[Series 15: T2 · sagittal · 3.0mm · 0.76mm/px · 6 of 17 slices shown (1 of 2)]
[im 1/17]
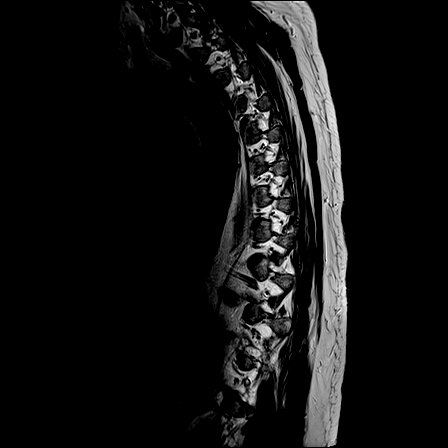
[im 4/17]
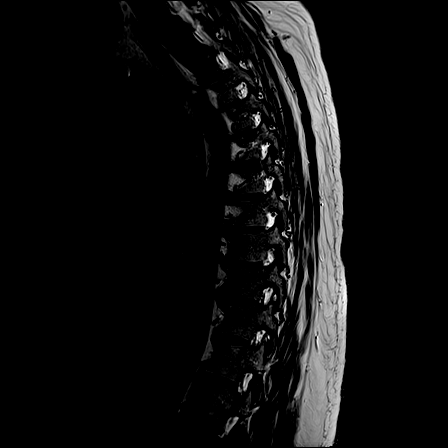
[im 7/17]
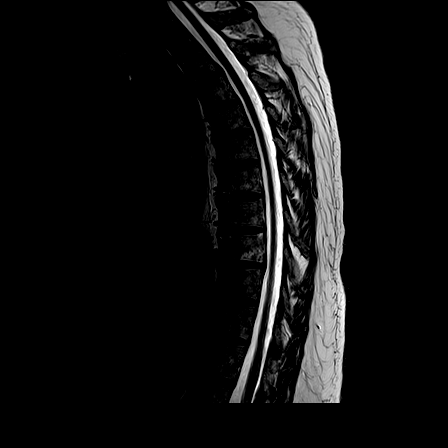
[im 10/17]
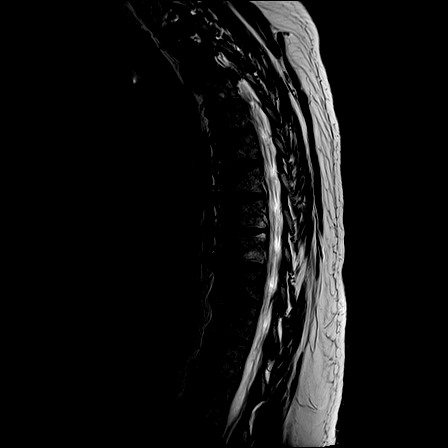
[im 13/17]
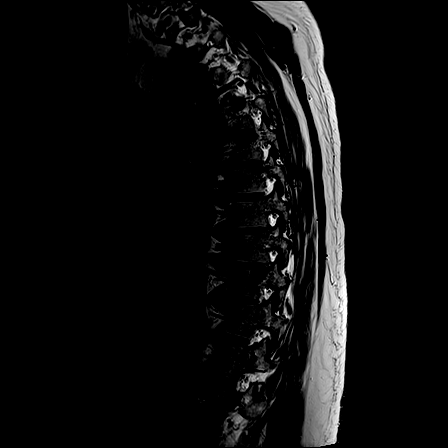
[im 17/17]
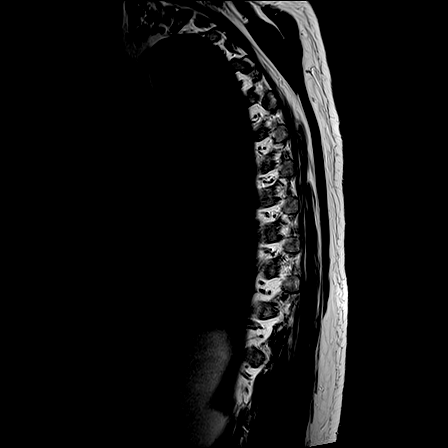

[Series 16: T1 · sagittal · 3.0mm · 0.76mm/px · 6 of 17 slices shown (2 of 2)]
[im 1/17]
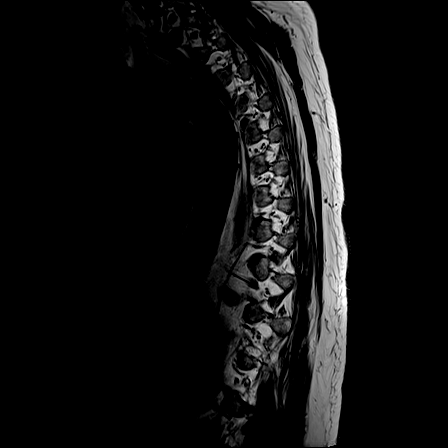
[im 4/17]
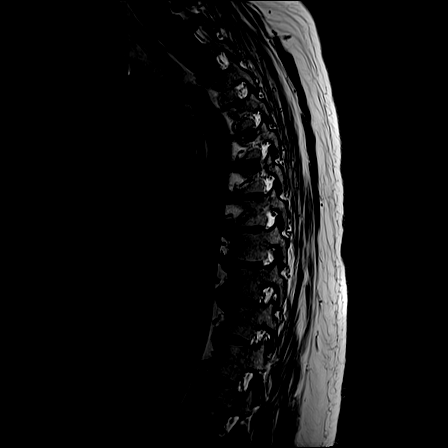
[im 7/17]
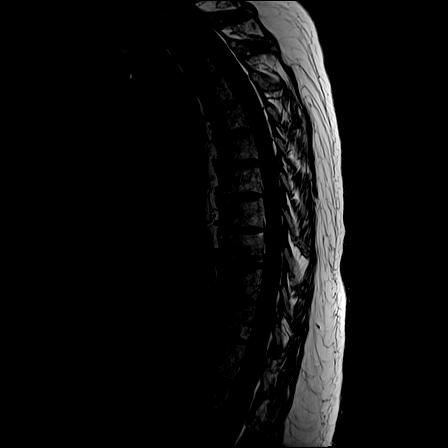
[im 10/17]
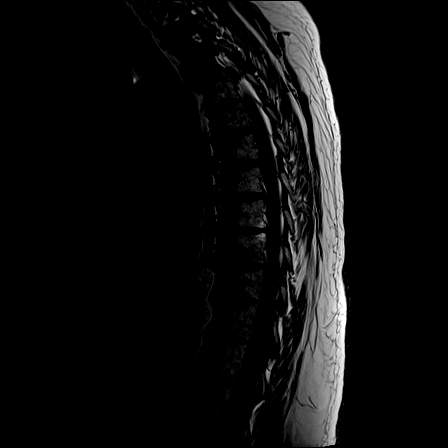
[im 13/17]
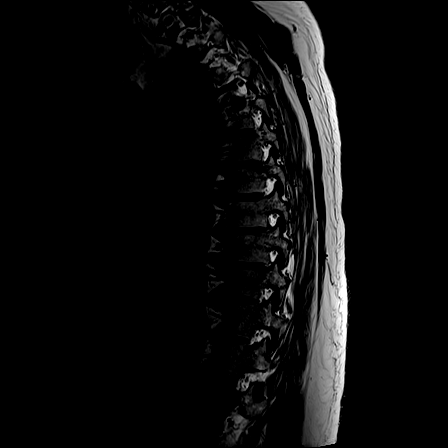
[im 17/17]
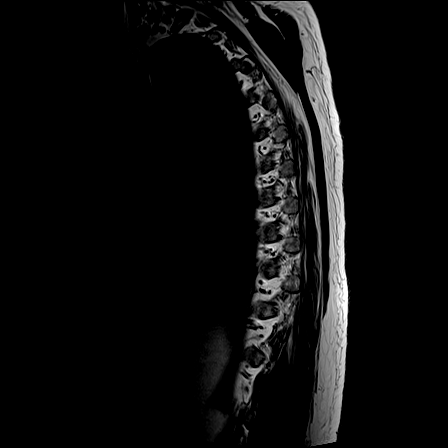

[Series 17: STIR · sagittal · 3.0mm · 0.38mm/px · 2 of 17 slices shown]
[im 1/17]
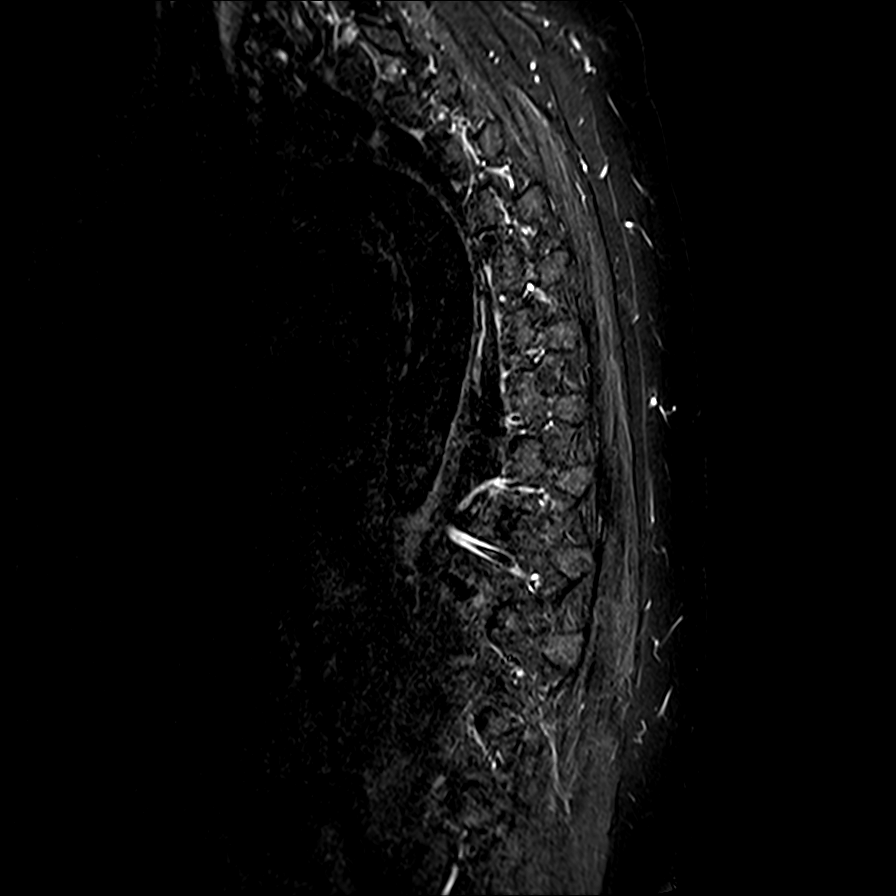
[im 4/17]
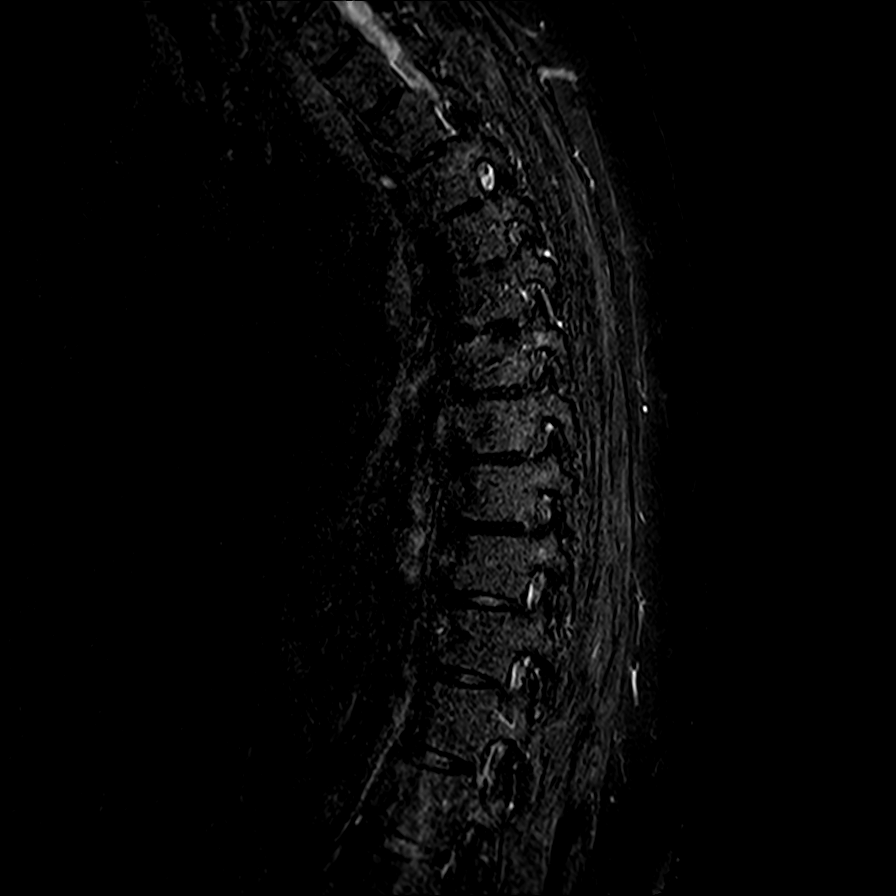

[Series 18: T2 · axial · 4.0mm · 0.59mm/px · z∈[-333,-84]mm · 8 of 39 slices shown (2 of 2)]
[im 1/39]
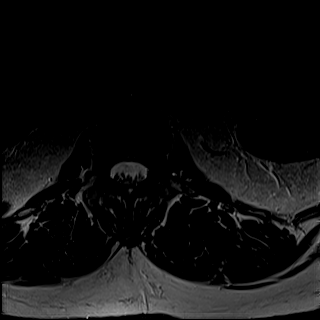
[im 6/39]
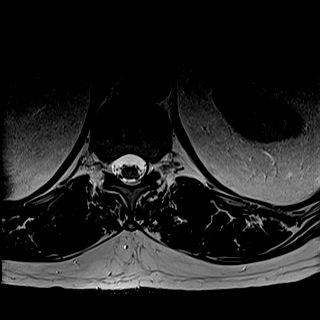
[im 12/39]
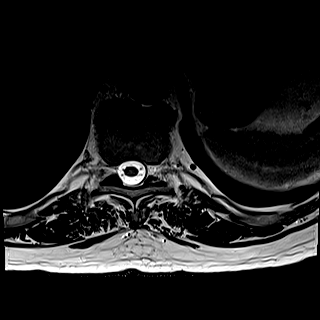
[im 18/39]
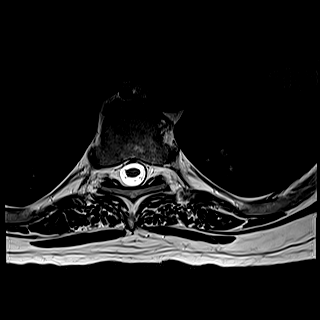
[im 21/39]
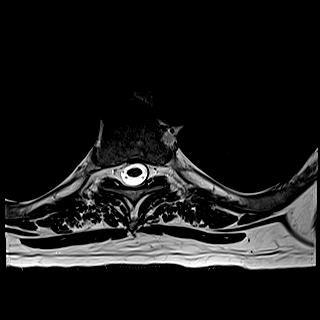
[im 27/39]
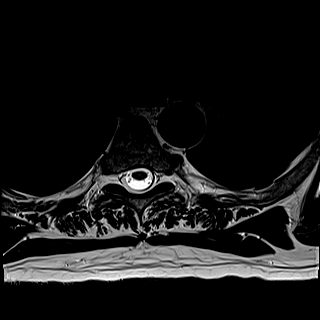
[im 33/39]
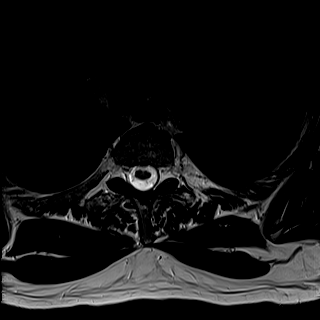
[im 39/39]
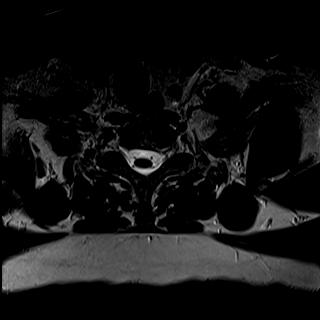

[24 of 48 positions shown; findings below may reference images not displayed]

FINDINGS: Alignment:  Mildly exaggerated thoracic kyphosis.

Vertebrae: There is marrow edema centered at the posterior aspect of
the T8-9 intervertebral disc with slightly increased T2 hyperintense
signal within the disc. Subtle associated endplate irregularities.
Findings are favored to represent acute/Modic type 1 degenerative
endplate changes. The remaining vertebral bodies have unremarkable
signal without fracture or marrow replacing lesion. Mild multilevel
anterior degenerative endplate spurring.

Cord:  Normal signal and morphology.

Paraspinal and other soft tissues: Negative.

Disc levels:

T6-T7: Mild right paracentral disc protrusion without foraminal or
canal stenosis.

T7-T8: Mild right paracentral disc protrusion without foraminal or
canal stenosis.

Remaining intervertebral disc levels are within normal limits.
IMPRESSION: 1. Endplate marrow edema centered at the T8-9 disc level with
slightly increased T2 hyperintense signal within the disc. These
findings are favored to represent acute/Modic type 1 degenerative
endplate changes. However, early changes related to
osteomyelitis-discitis would have a similar appearance in the
appropriate clinical setting. Correlation for clinical signs of
infection are recommended.
2. Minimal thoracic spine degenerative changes with small disc
bulges at T6-7 and T7-8 without foraminal or canal stenosis.

These results will be called to the ordering clinician or
representative by the Radiologist Assistant, and communication
documented in the PACS or zVision Dashboard.

## 2021-06-23 NOTE — Progress Notes (Signed)
Remote pacemaker transmission.   

## 2021-06-24 ENCOUNTER — Encounter: Payer: Self-pay | Admitting: Emergency Medicine

## 2021-06-24 ENCOUNTER — Ambulatory Visit
Admission: EM | Admit: 2021-06-24 | Discharge: 2021-06-24 | Disposition: A | Discharge status: Home / Self Care | Payer: Commercial Managed Care - PPO | Attending: Emergency Medicine | Admitting: Emergency Medicine

## 2021-06-24 DIAGNOSIS — R102 Pelvic and perineal pain unspecified side: Secondary | ICD-10-CM

## 2021-06-24 DIAGNOSIS — L03315 Cellulitis of perineum: Secondary | ICD-10-CM

## 2021-06-24 MED ORDER — MUPIROCIN 2 % EX OINT: 1.0000 "application " | TOPICAL_OINTMENT | Freq: Two times a day (BID) | CUTANEOUS | 0 refills | Status: DC

## 2021-06-24 NOTE — ED Triage Notes (Signed)
Sore area in between testicles and anus x 1 week.
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
## 2021-06-24 NOTE — Discharge Instructions (Addendum)
Keep area clean and dry  Avoid shaving Bactroban for possible infection Use OTC desitin to help with moisture control Take OTC ibuprofen or tylenol as needed for pain relief Follow up with DR. Phillips Odor for recheck next week Return or go to the ED if you have any new or worsening symptoms such as increased pain, redness, swelling, drainage, discharge, decreased range of motion of extremity, etc..

## 2021-06-27 ENCOUNTER — Encounter: Payer: Self-pay | Admitting: Internal Medicine

## 2021-07-05 ENCOUNTER — Ambulatory Visit (INDEPENDENT_AMBULATORY_CARE_PROVIDER_SITE_OTHER): Payer: Commercial Managed Care - PPO | Admitting: Internal Medicine

## 2021-07-05 ENCOUNTER — Other Ambulatory Visit: Payer: Self-pay

## 2021-07-05 ENCOUNTER — Encounter: Payer: Self-pay | Admitting: Internal Medicine

## 2021-07-05 VITALS — BP 98/68 | HR 95 | Ht 73.0 in | Wt 257.4 lb

## 2021-07-05 DIAGNOSIS — I442 Atrioventricular block, complete: Secondary | ICD-10-CM

## 2021-07-05 IMAGING — DX DG CHEST 2V
2 series · 2 of 2 positions shown · non-contrast
Comparison: Chest radiograph 06/10/2019

CLINICAL DATA: PT c/o palpitations intermittently since yesterday
but today started having more occurences and became diaphoretic and
felt shortness of breathe. PT denies any chest pain and states has a
pacemaker. Smoker.

EXAM:
CHEST - 2 VIEW

[chest pa]
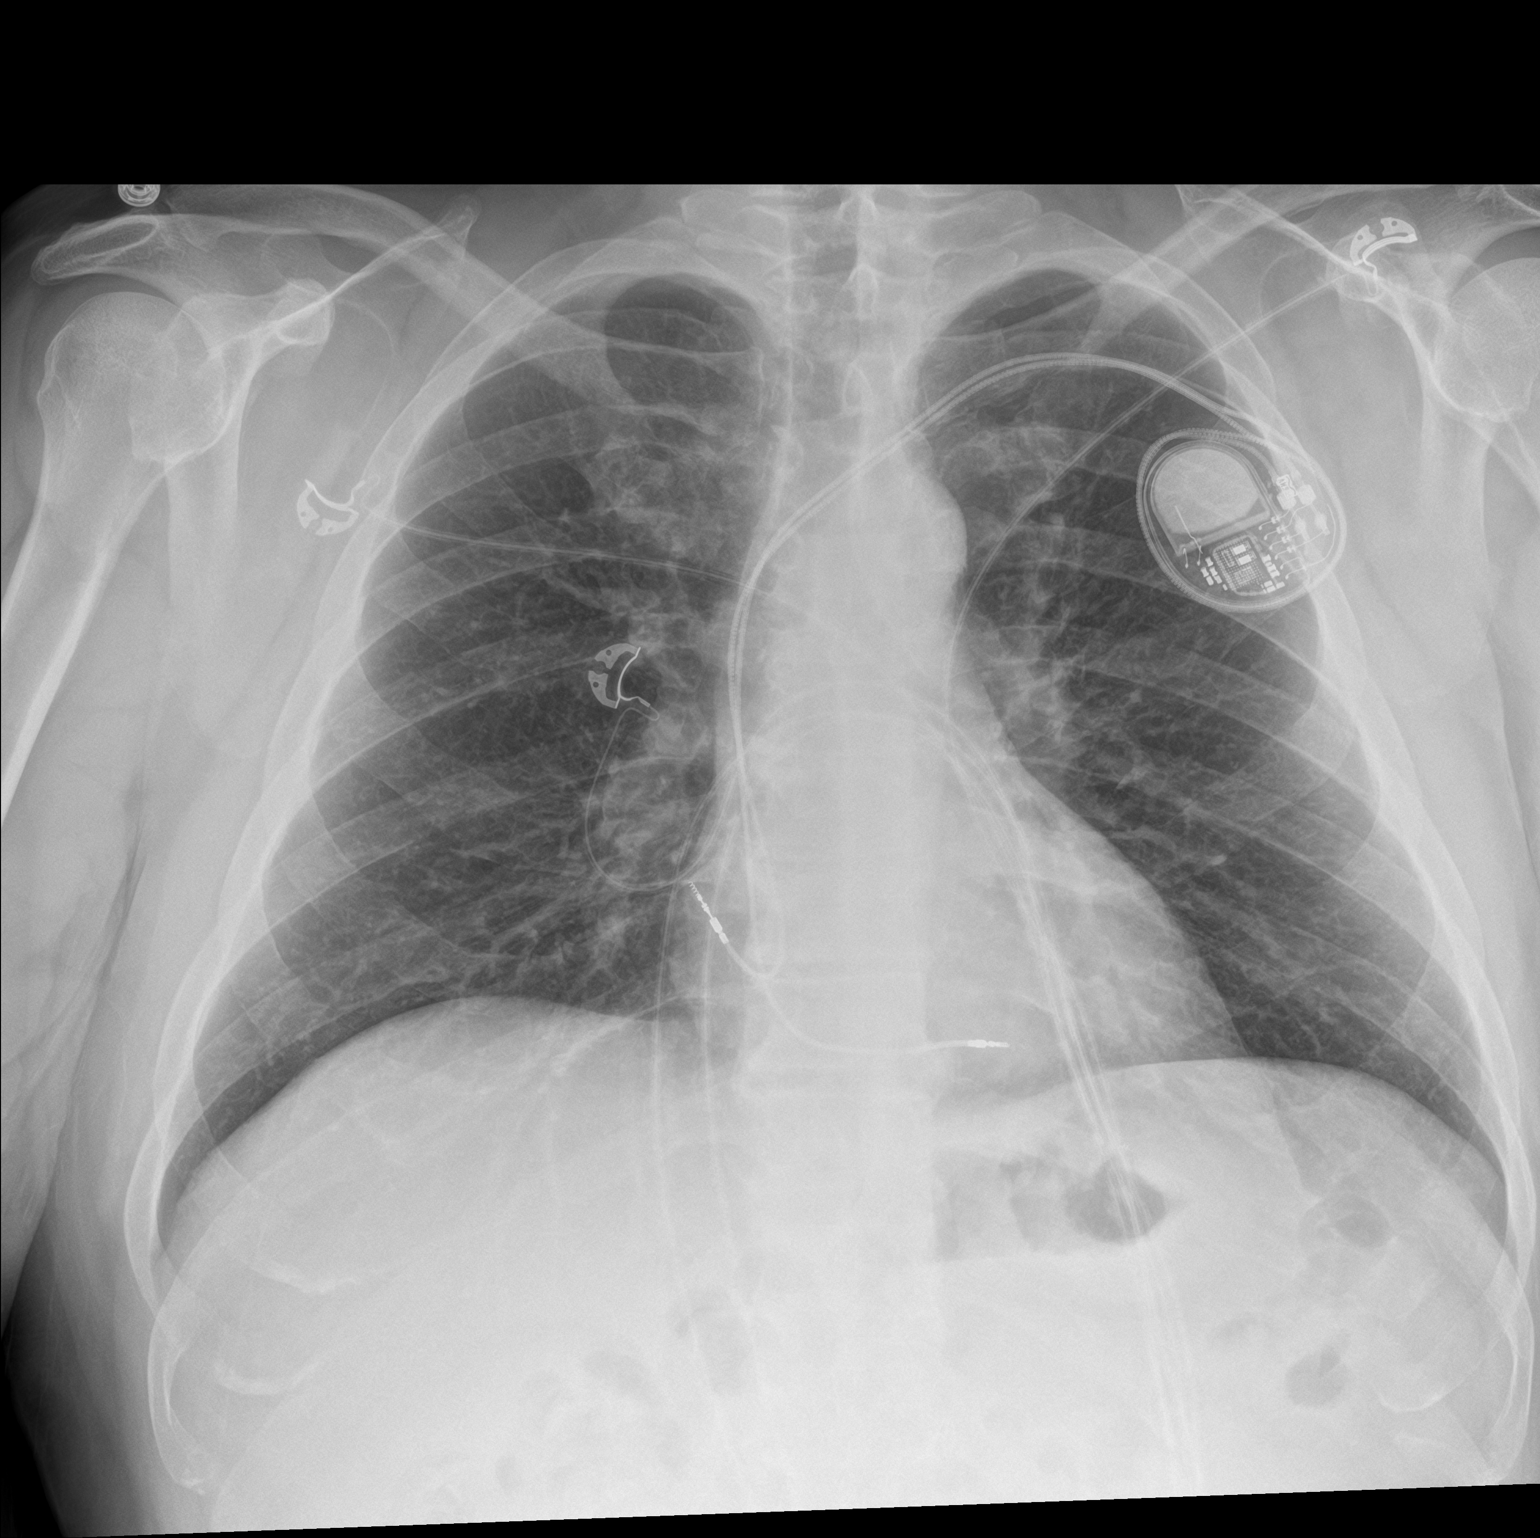

[chest lat]
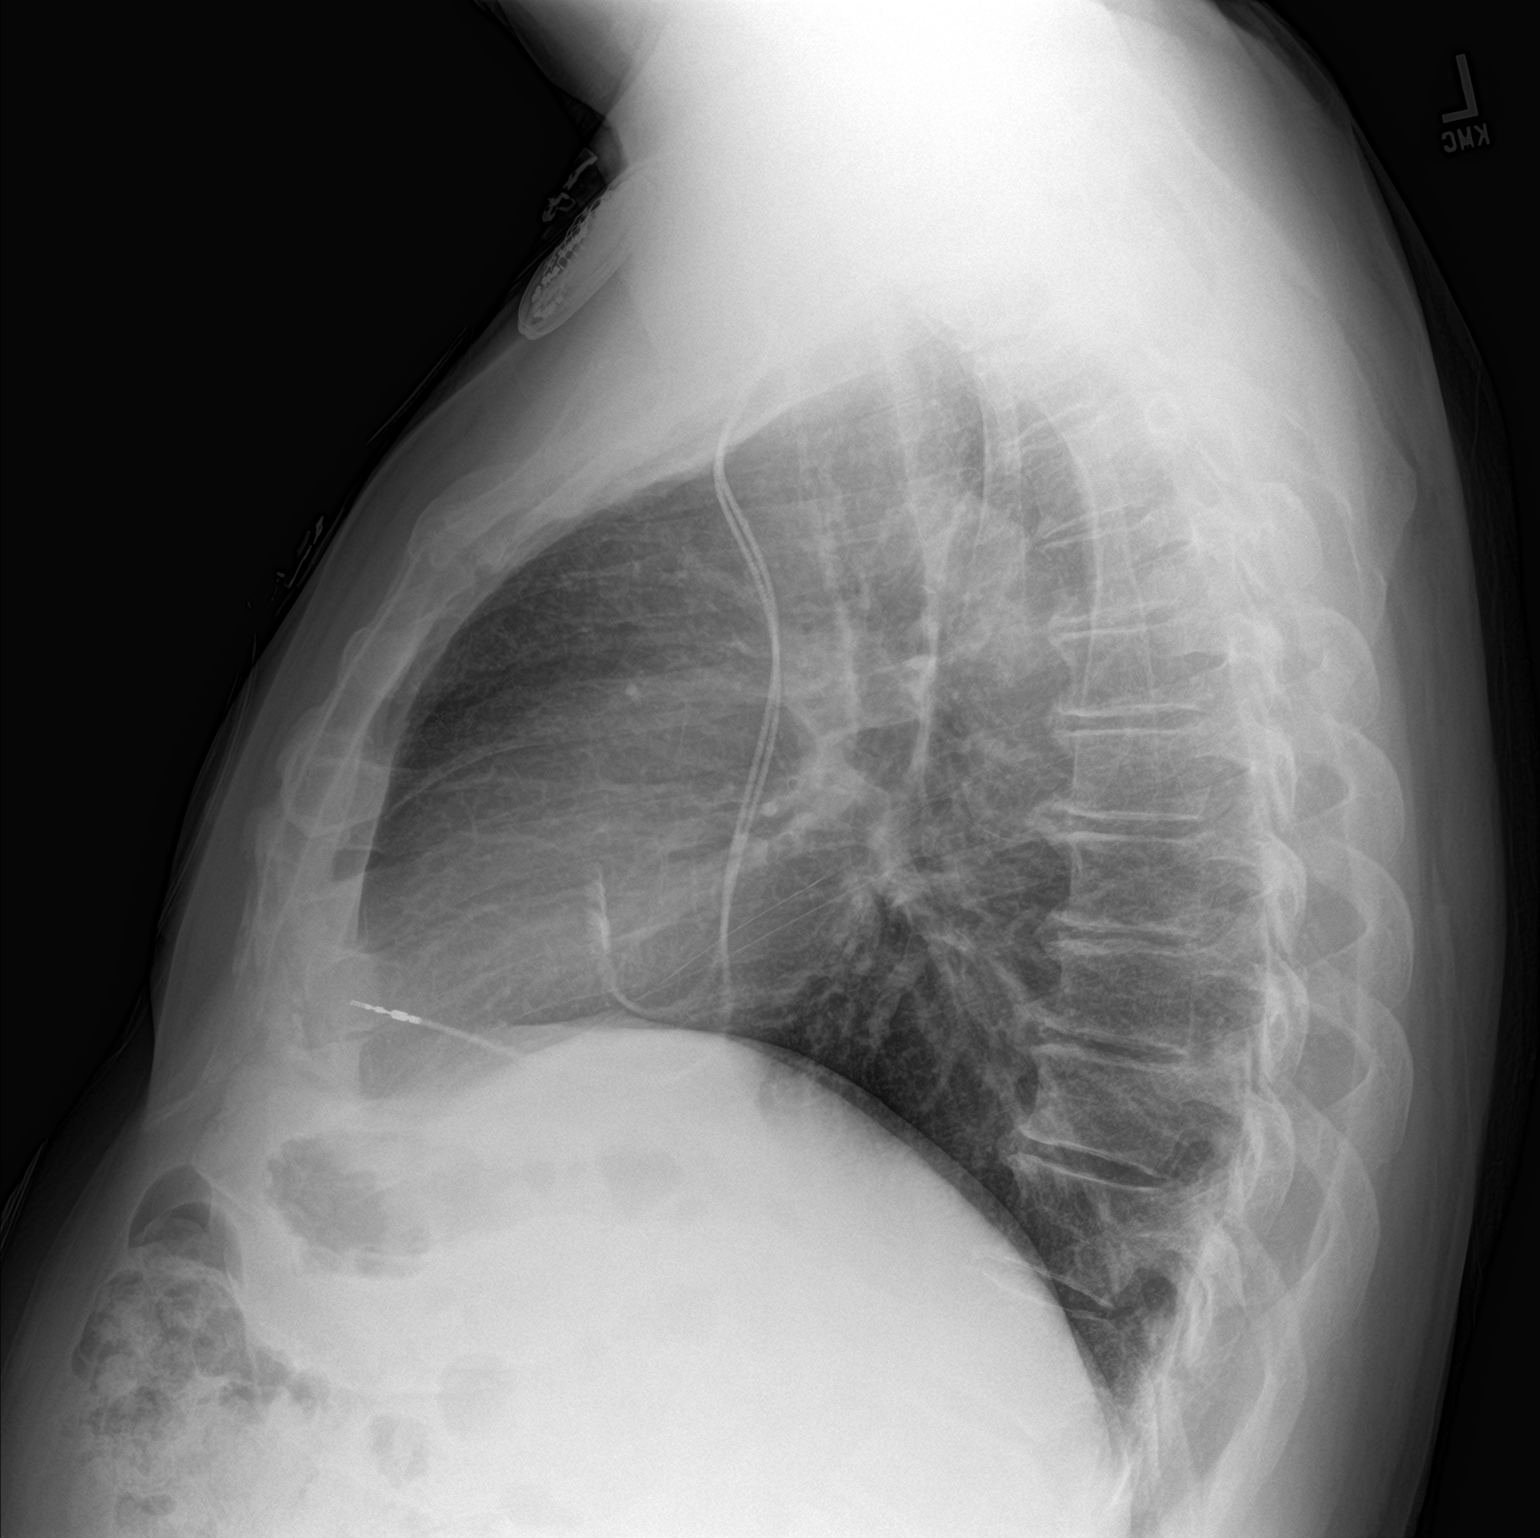

[2 of 2 positions shown; findings below may reference images not displayed]

FINDINGS: The cardiomediastinal contours are within normal limits. Left chest
dual lead pacer is unchanged in position. The lungs are clear. No
pneumothorax or pleural effusion. No acute finding in the visualized
skeleton.
IMPRESSION: No acute cardiopulmonary process.

## 2021-07-05 NOTE — Patient Instructions (Signed)
Medication Instructions:  Your physician recommends that you continue on your current medications as directed. Please refer to the Current Medication list given to you today.  *If you need a refill on your cardiac medications before your next appointment, please call your pharmacy*   Lab Work: NONE   If you have labs (blood work) drawn today and your tests are completely normal, you will receive your results only by: . MyChart Message (if you have MyChart) OR . A paper copy in the mail If you have any lab test that is abnormal or we need to change your treatment, we will call you to review the results.   Testing/Procedures: NONE    Follow-Up: At [facility], you and your health needs are our priority.  As part of our continuing mission to provide you with exceptional heart care, we have created designated Provider Care Teams.  These Care Teams include your primary Cardiologist (physician) and Advanced Practice Providers (APPs -  Physician Assistants and Nurse Practitioners) who all work together to provide you with the care you need, when you need it.  We recommend signing up for the patient portal called "MyChart".  Sign up information is provided on this After Visit Summary.  MyChart is used to connect with patients for Virtual Visits (Telemedicine).  Patients are able to view lab/test results, encounter notes, upcoming appointments, etc.  Non-urgent messages can be sent to your provider as well.   To learn more about what you can do with MyChart, go to [email or web address].    Your next appointment:   1 year(s)  The format for your next appointment:   In Person  Provider:   Gregg Taylor, MD   Other Instructions Thank you for choosing [facility]!    
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
## 2021-07-29 ENCOUNTER — Other Ambulatory Visit: Payer: Self-pay | Admitting: Internal Medicine

## 2021-09-01 ENCOUNTER — Ambulatory Visit (INDEPENDENT_AMBULATORY_CARE_PROVIDER_SITE_OTHER): Payer: Commercial Managed Care - PPO

## 2021-09-01 DIAGNOSIS — I442 Atrioventricular block, complete: Secondary | ICD-10-CM

## 2021-09-01 LAB — CUP PACEART REMOTE DEVICE CHECK
Battery Remaining Longevity: 138 mo
Battery Voltage: 2.95 V
Brady Statistic AP VP Percent: 1.12 %
Brady Statistic AP VS Percent: 1.33 %
Brady Statistic AS VP Percent: 0.06 %
Brady Statistic AS VS Percent: 97.49 %
Brady Statistic RA Percent Paced: 2.51 %
Brady Statistic RV Percent Paced: 1.18 %
Date Time Interrogation Session: 20221006003521
Implantable Lead Implant Date: 20190123
Implantable Lead Implant Date: 20190123
Implantable Lead Location: 753859
Implantable Lead Location: 753860
Implantable Lead Model: 5076
Implantable Lead Model: 5076
Implantable Pulse Generator Implant Date: 20190123
Lead Channel Impedance Value: 304 Ohm
Lead Channel Impedance Value: 380 Ohm
Lead Channel Impedance Value: 380 Ohm
Lead Channel Impedance Value: 418 Ohm
Lead Channel Pacing Threshold Amplitude: 0.875 V
Lead Channel Pacing Threshold Amplitude: 1.125 V
Lead Channel Pacing Threshold Pulse Width: 0.4 ms
Lead Channel Pacing Threshold Pulse Width: 0.4 ms
Lead Channel Sensing Intrinsic Amplitude: 1.75 mV
Lead Channel Sensing Intrinsic Amplitude: 1.75 mV
Lead Channel Sensing Intrinsic Amplitude: 9 mV
Lead Channel Sensing Intrinsic Amplitude: 9 mV
Lead Channel Setting Pacing Amplitude: 2 V
Lead Channel Setting Pacing Amplitude: 2.5 V
Lead Channel Setting Pacing Pulse Width: 0.4 ms
Lead Channel Setting Sensing Sensitivity: 1.2 mV

## 2021-09-07 ENCOUNTER — Ambulatory Visit
Admission: EM | Admit: 2021-09-07 | Discharge: 2021-09-07 | Disposition: A | Discharge status: Home / Self Care | Payer: Commercial Managed Care - PPO

## 2021-09-07 ENCOUNTER — Other Ambulatory Visit: Payer: Self-pay

## 2021-09-07 ENCOUNTER — Encounter: Payer: Self-pay | Admitting: Emergency Medicine

## 2021-09-07 DIAGNOSIS — J069 Acute upper respiratory infection, unspecified: Secondary | ICD-10-CM | POA: Diagnosis not present

## 2021-09-07 MED ORDER — IPRATROPIUM BROMIDE 0.06 % NA SOLN: 2.0000 | Freq: Two times a day (BID) | NASAL | 2 refills | Status: DC

## 2021-09-07 NOTE — ED Triage Notes (Signed)
Right ear pain and sore throat since yesterday.  Right sided head pain with some nasal congestion

## 2021-09-08 ENCOUNTER — Ambulatory Visit: Payer: Self-pay

## 2021-09-08 LAB — COVID-19, FLU A+B NAA
Influenza A, NAA: NOT DETECTED
Influenza B, NAA: NOT DETECTED
SARS-CoV-2, NAA: NOT DETECTED
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
## 2021-09-09 NOTE — Progress Notes (Signed)
Remote pacemaker transmission.   

## 2021-09-10 ENCOUNTER — Other Ambulatory Visit: Payer: Self-pay | Admitting: Cardiology

## 2021-09-13 ENCOUNTER — Telehealth: Payer: Self-pay | Admitting: Cardiology

## 2021-09-13 NOTE — Telephone Encounter (Signed)
New message     Patient is on medication for sinus infection - is it okay if he takes prednisone with the medication he is on?  Will it cause any side effects  Feels like his heart is racing when he takes it. He took the first 2 pills of the prednisone pack but will stop it is you dont want him to take it

## 2021-09-14 NOTE — Telephone Encounter (Signed)
Spoke with pt who states he was in to see his PCP and given steroid injection and Toradol. He states that he was given a  prednisone pack. He reports that in the past he has had heart palpitations with steroids. He does not report any this time. He would like to know if it is ok for him to take while on his current meds? Please advise.

## 2021-09-14 NOTE — Telephone Encounter (Signed)
Pt notified and verbalized understanding.

## 2021-09-14 NOTE — Telephone Encounter (Signed)
Ok to take prednisone, really no way to predict or prevent any potential side effects from the prednisone. If they occur essentially just have to lower the dose of stop them, but there is no contraindication to him taking   Dominga Ferry MD

## 2021-10-03 ENCOUNTER — Other Ambulatory Visit: Payer: Self-pay | Admitting: Cardiology

## 2021-10-03 ENCOUNTER — Other Ambulatory Visit: Payer: Self-pay | Admitting: Student

## 2021-10-16 ENCOUNTER — Telehealth: Payer: Self-pay | Admitting: Physician Assistant

## 2021-10-16 ENCOUNTER — Encounter: Payer: Self-pay | Admitting: Cardiology

## 2021-10-16 NOTE — Telephone Encounter (Signed)
   The patient called the answering service after-hours today. Chart reviewed, last cath 2020 with 2v CAD with long 60% mid/distal LAD stenosis and 80-90% stenosis involving distal rPDA. Medical therapy was recommended, reserving PCI for refractory angina. He has noticed the last few weeks he gets an episodic "full" feeling in his chest followed by searing/burning pain in his back at times. He reports prior hx of back issues tx with pain medication and cortisone injections but it is hard to differentiate this sensation from prior back pain. This morning he again woke up with the persistent chest fullness. It was not worse with exertion. He also has hx of anxiety and required Xanax this afternoon because he was anxious and the chest pressure then eased off. He is not sure what symptoms are from and inquires if we can tell him what that's from. I told him unfortunately we are unable to tell him definitively over the phone what his symptoms are from, would require definitive in-person eval with labs and EKG to be able to say for sure. We discussed proceeding to the ED but he sounds disinclined to do so at this time. He plans to call the office tomorrow with an update on how he is doing; likely needs to set up OV at that time. Will cc to MD as Lorain Childes.  Laurann Montana, PA-C

## 2021-10-17 NOTE — Telephone Encounter (Signed)
There are no available PA appts this week in [place] or [place].

## 2021-10-17 NOTE — Telephone Encounter (Signed)
Pt agreeable to see Dr. Wyline Mood in office at 1pm on Wednesday 10/19/21.

## 2021-10-19 ENCOUNTER — Ambulatory Visit (INDEPENDENT_AMBULATORY_CARE_PROVIDER_SITE_OTHER): Payer: Commercial Managed Care - PPO | Admitting: Cardiology

## 2021-10-19 ENCOUNTER — Other Ambulatory Visit: Payer: Self-pay

## 2021-10-19 ENCOUNTER — Encounter: Payer: Self-pay | Admitting: Cardiology

## 2021-10-19 VITALS — BP 130/84 | HR 85 | Ht 73.0 in | Wt 269.0 lb

## 2021-10-19 DIAGNOSIS — R0789 Other chest pain: Secondary | ICD-10-CM

## 2021-10-19 DIAGNOSIS — I2510 Atherosclerotic heart disease of native coronary artery without angina pectoris: Secondary | ICD-10-CM | POA: Diagnosis not present

## 2021-10-19 NOTE — Patient Instructions (Addendum)
Medication Instructions:  Your physician recommends that you continue on your current medications as directed. Please refer to the Current Medication list given to you today.  Call and update [place] on your chest pain in 2 weeks.   *If you need a refill on your cardiac medications before your next appointment, please call your pharmacy*   Lab Work: NONE   If you have labs (blood work) drawn today and your tests are completely normal, you will receive your results only by: MyChart Message (if you have MyChart) OR A paper copy in the mail If you have any lab test that is abnormal or we need to change your treatment, we will call you to review the results.   Testing/Procedures: NONE    Follow-Up: At [facility], you and your health needs are our priority.  As part of our continuing mission to provide you with exceptional heart care, we have created designated Provider Care Teams.  These Care Teams include your primary Cardiologist (physician) and Advanced Practice Providers (APPs -  Physician Assistants and Nurse Practitioners) who all work together to provide you with the care you need, when you need it.  We recommend signing up for the patient portal called "MyChart".  Sign up information is provided on this After Visit Summary.  MyChart is used to connect with patients for Virtual Visits (Telemedicine).  Patients are able to view lab/test results, encounter notes, upcoming appointments, etc.  Non-urgent messages can be sent to your provider as well.   To learn more about what you can do with MyChart, go to [email or web address].    Your next appointment:    January   The format for your next appointment:   In Person  Provider:   Dina Rich, MD    Other Instructions Thank you for choosing [facility]!
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
## 2021-11-04 ENCOUNTER — Telehealth: Payer: Self-pay

## 2021-11-04 ENCOUNTER — Encounter: Payer: Self-pay | Admitting: Cardiology

## 2021-11-04 DIAGNOSIS — R079 Chest pain, unspecified: Secondary | ICD-10-CM

## 2021-11-04 NOTE — Telephone Encounter (Signed)
Since ongonig symptoms of unclear cause and had a borderline blockage from his cath 2 years ago lets go ahead and get a lexiscan for chest pain please   Dominga Ferry MD

## 2021-11-04 NOTE — Telephone Encounter (Signed)
  Antoine Poche, MD routed this conversation to Me  Branch, Dorothe Pea, MD to Roseanne Reno, CMA      2:34 PM Note   Since ongonig symptoms of unclear cause and had a borderline blockage from his cath 2 years ago lets go ahead and get a lexiscan for chest pain please     Dominga Ferry MD        I spoke with patient and he agrees with plan for lexiscan, will schedule.Instructions given

## 2021-11-08 ENCOUNTER — Encounter: Payer: Self-pay | Admitting: Cardiology

## 2021-11-10 ENCOUNTER — Other Ambulatory Visit: Payer: Self-pay

## 2021-11-10 ENCOUNTER — Ambulatory Visit (HOSPITAL_COMMUNITY)
Admission: RE | Admit: 2021-11-10 | Discharge: 2021-11-10 | Disposition: A | Discharge status: Home / Self Care | Payer: Commercial Managed Care - PPO | Source: Ambulatory Visit | Attending: Cardiology | Admitting: Cardiology

## 2021-11-10 ENCOUNTER — Encounter (HOSPITAL_COMMUNITY)
Admission: RE | Admit: 2021-11-10 | Discharge: 2021-11-10 | Disposition: A | Discharge status: Home / Self Care | Payer: Commercial Managed Care - PPO | Source: Ambulatory Visit | Attending: Cardiology | Admitting: Cardiology

## 2021-11-10 ENCOUNTER — Encounter (HOSPITAL_COMMUNITY): Payer: Self-pay

## 2021-11-10 DIAGNOSIS — R079 Chest pain, unspecified: Secondary | ICD-10-CM | POA: Diagnosis not present

## 2021-11-10 LAB — NM MYOCAR MULTI W/SPECT W/WALL MOTION / EF
LV dias vol: 98 mL (ref 62–150)
LV sys vol: 31 mL
Nuc Stress EF: 69 %
Peak HR: 102 {beats}/min
RATE: 0.3
Rest HR: 74 {beats}/min
Rest Nuclear Isotope Dose: 10.7 mCi
SDS: 1
SRS: 0
SSS: 1
ST Depression (mm): 0 mm
Stress Nuclear Isotope Dose: 33 mCi
TID: 1.08

## 2021-11-10 MED ORDER — TECHNETIUM TC 99M TETROFOSMIN IV KIT
10.0000 | PACK | Freq: Once | INTRAVENOUS | Status: AC | PRN
  Administered 2021-11-10: 10.7 via INTRAVENOUS

## 2021-11-10 MED ORDER — REGADENOSON 0.4 MG/5ML IV SOLN
INTRAVENOUS | Status: AC
  Administered 2021-11-10: 0.4 mg via INTRAVENOUS
  Filled 2021-11-10: qty 5

## 2021-11-10 MED ORDER — TECHNETIUM TC 99M TETROFOSMIN IV KIT
30.0000 | PACK | Freq: Once | INTRAVENOUS | Status: AC | PRN
  Administered 2021-11-10: 33 via INTRAVENOUS

## 2021-11-10 MED ORDER — SODIUM CHLORIDE FLUSH 0.9 % IV SOLN
INTRAVENOUS | Status: AC
  Administered 2021-11-10: 10 mL via INTRAVENOUS
  Filled 2021-11-10: qty 10

## 2021-11-14 ENCOUNTER — Telehealth: Payer: Self-pay | Admitting: *Deleted

## 2021-11-14 NOTE — Telephone Encounter (Signed)
Patient informed and verbalized understanding of plan. 

## 2021-11-14 NOTE — Telephone Encounter (Signed)
Per Dr. Rodney Cruise stress test without blockages. Can we coordinate a home device check to see if any significant arrhythmias perhaps correlating with his symptoms. Needs to f/u with pcp as well to consider noncardiac causes of his symptoms.

## 2021-11-15 ENCOUNTER — Telehealth: Payer: Self-pay

## 2021-11-15 NOTE — Telephone Encounter (Signed)
Normal device function. No episodes noted. Patient called and updated. Advised I will forward to Dr. Wyline Mood for review. Appreciative of update.

## 2021-11-15 NOTE — Telephone Encounter (Signed)
The patient left a message that he sent a transmission on 11/14/2021. Dr. Wyline Mood asked him to send it to see if he had any kind of Arrhythmias on it. The patient had some chest pains. He did a stress test yesterday. He is asking for the results to be sent to Dr. Wyline Mood. His phone number is [phone].

## 2021-11-16 NOTE — Telephone Encounter (Signed)
Normal heart monitor check. Everything is pointing to his symptoms not being cardiac, please have him f/u with pcp to consider other possible causes including possible GI causes  Dominga Ferry MD

## 2021-11-17 NOTE — Telephone Encounter (Addendum)
Patient notified and verbalized understanding.   States that he had been on Celebrex & has since stopped and is feeling a lot better.

## 2021-12-01 ENCOUNTER — Ambulatory Visit (INDEPENDENT_AMBULATORY_CARE_PROVIDER_SITE_OTHER): Payer: Commercial Managed Care - PPO

## 2021-12-01 DIAGNOSIS — I442 Atrioventricular block, complete: Secondary | ICD-10-CM

## 2021-12-01 LAB — CUP PACEART REMOTE DEVICE CHECK
Battery Remaining Longevity: 135 mo
Battery Voltage: 2.95 V
Brady Statistic AP VP Percent: 1.21 %
Brady Statistic AP VS Percent: 1.32 %
Brady Statistic AS VP Percent: 0.04 %
Brady Statistic AS VS Percent: 97.43 %
Brady Statistic RA Percent Paced: 2.59 %
Brady Statistic RV Percent Paced: 1.25 %
Date Time Interrogation Session: 20230104233745
Implantable Lead Implant Date: 20190123
Implantable Lead Implant Date: 20190123
Implantable Lead Location: 753859
Implantable Lead Location: 753860
Implantable Lead Model: 5076
Implantable Lead Model: 5076
Implantable Pulse Generator Implant Date: 20190123
Lead Channel Impedance Value: 304 Ohm
Lead Channel Impedance Value: 342 Ohm
Lead Channel Impedance Value: 380 Ohm
Lead Channel Impedance Value: 418 Ohm
Lead Channel Pacing Threshold Amplitude: 1 V
Lead Channel Pacing Threshold Amplitude: 1.125 V
Lead Channel Pacing Threshold Pulse Width: 0.4 ms
Lead Channel Pacing Threshold Pulse Width: 0.4 ms
Lead Channel Sensing Intrinsic Amplitude: 2.375 mV
Lead Channel Sensing Intrinsic Amplitude: 2.375 mV
Lead Channel Sensing Intrinsic Amplitude: 9.25 mV
Lead Channel Sensing Intrinsic Amplitude: 9.25 mV
Lead Channel Setting Pacing Amplitude: 2 V
Lead Channel Setting Pacing Amplitude: 2.5 V
Lead Channel Setting Pacing Pulse Width: 0.4 ms
Lead Channel Setting Sensing Sensitivity: 1.2 mV

## 2021-12-12 ENCOUNTER — Ambulatory Visit: Payer: Commercial Managed Care - PPO | Admitting: Cardiology

## 2021-12-12 NOTE — Progress Notes (Signed)
Remote pacemaker transmission.   

## 2021-12-30 ENCOUNTER — Ambulatory Visit: Payer: Commercial Managed Care - PPO | Admitting: Cardiology

## 2022-03-02 ENCOUNTER — Ambulatory Visit (INDEPENDENT_AMBULATORY_CARE_PROVIDER_SITE_OTHER): Payer: Commercial Managed Care - PPO

## 2022-03-02 DIAGNOSIS — I442 Atrioventricular block, complete: Secondary | ICD-10-CM

## 2022-03-02 LAB — CUP PACEART REMOTE DEVICE CHECK
Battery Remaining Longevity: 132 mo
Battery Voltage: 2.95 V
Brady Statistic AP VP Percent: 1.25 %
Brady Statistic AP VS Percent: 0.99 %
Brady Statistic AS VP Percent: 0.04 %
Brady Statistic AS VS Percent: 97.72 %
Brady Statistic RA Percent Paced: 2.28 %
Brady Statistic RV Percent Paced: 1.29 %
Date Time Interrogation Session: 20230406003959
Implantable Lead Implant Date: 20190123
Implantable Lead Implant Date: 20190123
Implantable Lead Location: 753859
Implantable Lead Location: 753860
Implantable Lead Model: 5076
Implantable Lead Model: 5076
Implantable Pulse Generator Implant Date: 20190123
Lead Channel Impedance Value: 323 Ohm
Lead Channel Impedance Value: 342 Ohm
Lead Channel Impedance Value: 399 Ohm
Lead Channel Impedance Value: 437 Ohm
Lead Channel Pacing Threshold Amplitude: 0.875 V
Lead Channel Pacing Threshold Amplitude: 0.875 V
Lead Channel Pacing Threshold Pulse Width: 0.4 ms
Lead Channel Pacing Threshold Pulse Width: 0.4 ms
Lead Channel Sensing Intrinsic Amplitude: 11.25 mV
Lead Channel Sensing Intrinsic Amplitude: 11.25 mV
Lead Channel Sensing Intrinsic Amplitude: 3.125 mV
Lead Channel Sensing Intrinsic Amplitude: 3.125 mV
Lead Channel Setting Pacing Amplitude: 2 V
Lead Channel Setting Pacing Amplitude: 2.5 V
Lead Channel Setting Pacing Pulse Width: 0.4 ms
Lead Channel Setting Sensing Sensitivity: 1.2 mV

## 2022-03-03 ENCOUNTER — Ambulatory Visit (INDEPENDENT_AMBULATORY_CARE_PROVIDER_SITE_OTHER): Payer: Commercial Managed Care - PPO | Admitting: Cardiology

## 2022-03-03 ENCOUNTER — Encounter: Payer: Self-pay | Admitting: Cardiology

## 2022-03-03 VITALS — BP 120/80 | HR 80 | Ht 73.0 in | Wt 270.4 lb

## 2022-03-03 DIAGNOSIS — E782 Mixed hyperlipidemia: Secondary | ICD-10-CM | POA: Diagnosis not present

## 2022-03-03 DIAGNOSIS — I10 Essential (primary) hypertension: Secondary | ICD-10-CM

## 2022-03-03 DIAGNOSIS — I2510 Atherosclerotic heart disease of native coronary artery without angina pectoris: Secondary | ICD-10-CM | POA: Diagnosis not present

## 2022-03-03 NOTE — Patient Instructions (Addendum)
Follow-Up: °Follow up with Dr. Branch in 6 months ° °Any Other Special Instructions Will Be Listed Below (If Applicable). ° ° ° ° °If you need a refill on your cardiac medications before your next appointment, please call your pharmacy. ° °
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
## 2022-03-17 NOTE — Progress Notes (Signed)
Remote pacemaker transmission.   

## 2022-03-31 ENCOUNTER — Other Ambulatory Visit: Payer: Self-pay | Admitting: Neurological Surgery

## 2022-03-31 ENCOUNTER — Other Ambulatory Visit (HOSPITAL_COMMUNITY): Payer: Self-pay | Admitting: Neurological Surgery

## 2022-03-31 DIAGNOSIS — M5416 Radiculopathy, lumbar region: Secondary | ICD-10-CM

## 2022-04-20 DIAGNOSIS — Z0271 Encounter for disability determination: Secondary | ICD-10-CM

## 2022-04-21 ENCOUNTER — Ambulatory Visit
Admission: EM | Admit: 2022-04-21 | Discharge: 2022-04-21 | Disposition: A | Discharge status: Home / Self Care | Payer: Commercial Managed Care - PPO | Attending: Nurse Practitioner | Admitting: Nurse Practitioner

## 2022-04-21 DIAGNOSIS — H6983 Other specified disorders of Eustachian tube, bilateral: Secondary | ICD-10-CM

## 2022-04-21 DIAGNOSIS — J309 Allergic rhinitis, unspecified: Secondary | ICD-10-CM

## 2022-04-21 MED ORDER — PSEUDOEPH-BROMPHEN-DM 30-2-10 MG/5ML PO SYRP: 5.0000 mL | ORAL_SOLUTION | Freq: Four times a day (QID) | ORAL | 0 refills | Status: DC | PRN

## 2022-04-21 MED ORDER — CETIRIZINE HCL 10 MG PO TABS: 10.0000 mg | ORAL_TABLET | Freq: Every day | ORAL | 0 refills | Status: DC

## 2022-04-21 MED ORDER — PREDNISONE 20 MG PO TABS: 20.0000 mg | ORAL_TABLET | Freq: Every day | ORAL | 0 refills | Status: AC

## 2022-04-21 MED ORDER — FLUTICASONE PROPIONATE 50 MCG/ACT NA SUSP: 2.0000 | Freq: Every day | NASAL | 0 refills | Status: DC

## 2022-04-21 NOTE — ED Triage Notes (Signed)
Pt presents with c/o sore throat and ear pressure  for past week
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
## 2022-04-21 NOTE — Discharge Instructions (Signed)
Take medication as prescribed. May take ibuprofen or Tylenol for pain, fever, or general discomfort. Increase fluids and allow for plenty of rest. Try to avoid allergy triggers as much as possible. If your symptoms worsen or do not improve within the next 7 to 10 days, follow-up in our clinic for reevaluation.

## 2022-05-05 ENCOUNTER — Ambulatory Visit
Admission: EM | Admit: 2022-05-05 | Discharge: 2022-05-05 | Disposition: A | Discharge status: Home / Self Care | Payer: Commercial Managed Care - PPO | Attending: Nurse Practitioner | Admitting: Nurse Practitioner

## 2022-05-05 ENCOUNTER — Ambulatory Visit: Payer: Commercial Managed Care - PPO

## 2022-05-05 DIAGNOSIS — B9689 Other specified bacterial agents as the cause of diseases classified elsewhere: Secondary | ICD-10-CM

## 2022-05-05 DIAGNOSIS — J019 Acute sinusitis, unspecified: Secondary | ICD-10-CM | POA: Diagnosis not present

## 2022-05-05 MED ORDER — DOXYCYCLINE HYCLATE 100 MG PO CAPS: 100.0000 mg | ORAL_CAPSULE | Freq: Two times a day (BID) | ORAL | 0 refills | Status: AC

## 2022-05-05 NOTE — Discharge Instructions (Signed)
-   Please start on the antibiotic (doxycycline) and take it twice daily for 7 days for the sinus infection -Continue the cetirizine and Flonase -Start nasal saline rinses with a saline bottle to help with the sinus congestion -Seek care if your symptoms persist or worsen despite treatment

## 2022-05-05 NOTE — ED Triage Notes (Signed)
Pt reports ear fulness, clear nasal drainage, sinus pressure and cough and "woozy headache" x 1 week. States he had ear pressure 2 weeks ago and improved after prednisone.
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
## 2022-05-17 ENCOUNTER — Encounter: Payer: Self-pay | Admitting: Cardiology

## 2022-05-17 NOTE — Telephone Encounter (Signed)
I would agree with Dr Phillips Odor. HDL just slightly low and triglycerides just slightly high. Typically these improve with exercise and weight loss alone. We don't really use medication for low HDL anymore as it really did not help prevent cardiac events. There is medication for high triglycerides but we reserve that for very high numbers typically 400s-500s.    Dina Rich MD

## 2022-05-19 ENCOUNTER — Ambulatory Visit (HOSPITAL_COMMUNITY)
Admission: RE | Admit: 2022-05-19 | Discharge: 2022-05-19 | Disposition: A | Discharge status: Home / Self Care | Payer: Commercial Managed Care - PPO | Source: Ambulatory Visit | Attending: Neurological Surgery | Admitting: Neurological Surgery

## 2022-05-19 DIAGNOSIS — M5416 Radiculopathy, lumbar region: Secondary | ICD-10-CM | POA: Insufficient documentation

## 2022-05-23 ENCOUNTER — Telehealth: Payer: Self-pay | Admitting: Cardiology

## 2022-06-01 ENCOUNTER — Ambulatory Visit (INDEPENDENT_AMBULATORY_CARE_PROVIDER_SITE_OTHER): Payer: Commercial Managed Care - PPO

## 2022-06-01 DIAGNOSIS — I442 Atrioventricular block, complete: Secondary | ICD-10-CM

## 2022-06-01 LAB — CUP PACEART REMOTE DEVICE CHECK
Battery Remaining Longevity: 129 mo
Battery Voltage: 2.94 V
Brady Statistic AP VP Percent: 1.01 %
Brady Statistic AP VS Percent: 0.84 %
Brady Statistic AS VP Percent: 0.03 %
Brady Statistic AS VS Percent: 98.12 %
Brady Statistic RA Percent Paced: 1.85 %
Brady Statistic RV Percent Paced: 1.04 %
Date Time Interrogation Session: 20230706003944
Implantable Lead Implant Date: 20190123
Implantable Lead Implant Date: 20190123
Implantable Lead Location: 753859
Implantable Lead Location: 753860
Implantable Lead Model: 5076
Implantable Lead Model: 5076
Implantable Pulse Generator Implant Date: 20190123
Lead Channel Impedance Value: 304 Ohm
Lead Channel Impedance Value: 323 Ohm
Lead Channel Impedance Value: 380 Ohm
Lead Channel Impedance Value: 418 Ohm
Lead Channel Pacing Threshold Amplitude: 0.875 V
Lead Channel Pacing Threshold Amplitude: 1.125 V
Lead Channel Pacing Threshold Pulse Width: 0.4 ms
Lead Channel Pacing Threshold Pulse Width: 0.4 ms
Lead Channel Sensing Intrinsic Amplitude: 10.125 mV
Lead Channel Sensing Intrinsic Amplitude: 10.125 mV
Lead Channel Sensing Intrinsic Amplitude: 2.75 mV
Lead Channel Sensing Intrinsic Amplitude: 2.75 mV
Lead Channel Setting Pacing Amplitude: 2 V
Lead Channel Setting Pacing Amplitude: 2.5 V
Lead Channel Setting Pacing Pulse Width: 0.4 ms
Lead Channel Setting Sensing Sensitivity: 1.2 mV

## 2022-06-09 ENCOUNTER — Ambulatory Visit
Admission: EM | Admit: 2022-06-09 | Discharge: 2022-06-09 | Disposition: A | Discharge status: Home / Self Care | Payer: Commercial Managed Care - PPO

## 2022-06-09 DIAGNOSIS — R0989 Other specified symptoms and signs involving the circulatory and respiratory systems: Secondary | ICD-10-CM | POA: Diagnosis not present

## 2022-06-09 DIAGNOSIS — H9203 Otalgia, bilateral: Secondary | ICD-10-CM | POA: Diagnosis not present
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
## 2022-06-09 NOTE — ED Triage Notes (Signed)
Pt reports throat sore and feels swollen since,  ears feels full of water since this morning. States he feel something is stuck in the throat. Zyrtec and Flonase gives no relief.

## 2022-06-19 NOTE — Progress Notes (Signed)
Remote pacemaker transmission.   

## 2022-06-26 ENCOUNTER — Emergency Department (HOSPITAL_COMMUNITY): Payer: Commercial Managed Care - PPO

## 2022-06-26 ENCOUNTER — Encounter (HOSPITAL_COMMUNITY): Payer: Self-pay | Admitting: *Deleted

## 2022-06-26 ENCOUNTER — Emergency Department (HOSPITAL_COMMUNITY)
Admission: EM | Admit: 2022-06-26 | Discharge: 2022-06-27 | Disposition: A | Discharge status: Home / Self Care | Payer: Commercial Managed Care - PPO | Attending: Emergency Medicine | Admitting: Emergency Medicine

## 2022-06-26 ENCOUNTER — Other Ambulatory Visit: Payer: Self-pay

## 2022-06-26 DIAGNOSIS — R1010 Upper abdominal pain, unspecified: Secondary | ICD-10-CM

## 2022-06-26 DIAGNOSIS — Z79899 Other long term (current) drug therapy: Secondary | ICD-10-CM | POA: Insufficient documentation

## 2022-06-26 DIAGNOSIS — I2510 Atherosclerotic heart disease of native coronary artery without angina pectoris: Secondary | ICD-10-CM | POA: Insufficient documentation

## 2022-06-26 LAB — URINALYSIS, ROUTINE W REFLEX MICROSCOPIC
Bilirubin Urine: NEGATIVE
Glucose, UA: NEGATIVE mg/dL
Hgb urine dipstick: NEGATIVE
Ketones, ur: 20 mg/dL — AB
Leukocytes,Ua: NEGATIVE
Nitrite: NEGATIVE
Protein, ur: NEGATIVE mg/dL
Specific Gravity, Urine: 1.018 (ref 1.005–1.030)
pH: 5 (ref 5.0–8.0)

## 2022-06-26 LAB — COMPREHENSIVE METABOLIC PANEL
ALT: 19 U/L (ref 0–44)
AST: 17 U/L (ref 15–41)
Albumin: 4.8 g/dL (ref 3.5–5.0)
Alkaline Phosphatase: 54 U/L (ref 38–126)
Anion gap: 9 (ref 5–15)
BUN: 17 mg/dL (ref 6–20)
CO2: 24 mmol/L (ref 22–32)
Calcium: 9.8 mg/dL (ref 8.9–10.3)
Chloride: 104 mmol/L (ref 98–111)
Creatinine, Ser: 1.19 mg/dL (ref 0.61–1.24)
GFR, Estimated: >60 mL/min (ref >60)
Glucose, Bld: 90 mg/dL (ref 70–99)
Potassium: 4.4 mmol/L (ref 3.5–5.1)
Sodium: 137 mmol/L (ref 135–145)
Total Bilirubin: 1 mg/dL (ref 0.3–1.2)
Total Protein: 8.2 g/dL — ABNORMAL HIGH (ref 6.5–8.1)

## 2022-06-26 LAB — CBC
HCT: 47.5 % (ref 39.0–52.0)
Hemoglobin: 16.3 g/dL (ref 13.0–17.0)
MCH: 32.2 pg (ref 26.0–34.0)
MCHC: 34.3 g/dL (ref 30.0–36.0)
MCV: 93.9 fL (ref 80.0–100.0)
Platelets: 287 10*3/uL (ref 150–400)
RBC: 5.06 MIL/uL (ref 4.22–5.81)
RDW: 12.3 % (ref 11.5–15.5)
WBC: 8.9 10*3/uL (ref 4.0–10.5)
nRBC: 0 % (ref 0.0–0.2)

## 2022-06-26 LAB — TROPONIN I (HIGH SENSITIVITY)
Troponin I (High Sensitivity): 6 ng/L (ref <18)
Troponin I (High Sensitivity): 6 ng/L (ref <18)

## 2022-06-26 LAB — LIPASE, BLOOD: Lipase: 28 U/L (ref 11–51)

## 2022-06-26 MED ORDER — PANTOPRAZOLE SODIUM 40 MG IV SOLR
40.0000 mg | Freq: Once | INTRAVENOUS | Status: AC
  Administered 2022-06-26: 40 mg via INTRAVENOUS
  Filled 2022-06-26: qty 10

## 2022-06-26 MED ORDER — ONDANSETRON HCL 4 MG/2ML IJ SOLN
4.0000 mg | Freq: Once | INTRAMUSCULAR | Status: AC
  Administered 2022-06-26: 4 mg via INTRAVENOUS
  Filled 2022-06-26: qty 2

## 2022-06-26 MED ORDER — PANTOPRAZOLE SODIUM 20 MG PO TBEC: 20.0000 mg | DELAYED_RELEASE_TABLET | Freq: Every day | ORAL | 0 refills | Status: DC

## 2022-06-26 MED ORDER — IOHEXOL 300 MG/ML  SOLN
100.0000 mL | Freq: Once | INTRAMUSCULAR | Status: AC | PRN
  Administered 2022-06-26: 100 mL via INTRAVENOUS

## 2022-06-26 NOTE — ED Notes (Signed)
Patient transported to CT 

## 2022-06-26 NOTE — ED Triage Notes (Signed)
Pt with pressure to right upper abd and radiates to back.  Tried to see GI but unable to make an appt til 3 weeks out.  Pt with back pain.  Diarrhea a week ago,  Thursday BM was normal. LBM today and normal.

## 2022-06-26 NOTE — Discharge Instructions (Signed)
Follow-up with Dr. Jena Gauss this week for recheck.  Return if problem

## 2022-06-26 NOTE — ED Provider Notes (Signed)
[facility] EMERGENCY DEPARTMENT Provider Note   CSN: [id number] Arrival date & time: 06/26/22  1829     History {Add pertinent medical, surgical, social history, OB history to HPI:1} Chief Complaint  Patient presents with   Abdominal Pain    Bill Castro is a 61 y.o. male.  Patient complains of right upper quadrant abdominal pain.  He has a history of coronary artery disease   Abdominal Pain      Home Medications Prior to Admission medications   Medication Sig Start Date End Date Taking? Authorizing Provider  pantoprazole (PROTONIX) 20 MG tablet Take 1 tablet (20 mg total) by mouth daily. 06/26/22  Yes Bethann Berkshire, MD  acetaminophen (TYLENOL) 500 MG tablet Take 1,000 mg by mouth every 6 (six) hours as needed (back pain).     [provider]  ALPRAZolam Prudy Feeler) 1 MG tablet Take 1 mg by mouth 3 (three) times daily as needed. Usually takes 1 tablet at bedtime 12/28/17   [provider]  aspirin EC 81 MG tablet Take 81 mg by mouth daily with lunch.     [provider]  atorvastatin (LIPITOR) 20 MG tablet TAKE 1 TABLET(20 MG) BY MOUTH DAILY 09/12/21   Antoine Poche, MD  brompheniramine-pseudoephedrine-DM 30-2-10 MG/5ML syrup Take 5 mLs by mouth 4 (four) times daily as needed. 04/21/22   Leath-Warren, Sadie Haber, NP  cetirizine (ZYRTEC) 10 MG tablet Take 1 tablet (10 mg total) by mouth daily. 04/21/22   Leath-Warren, Sadie Haber, NP  cholecalciferol (VITAMIN D3) 25 MCG (1000 UNIT) tablet Take 1,000 Units by mouth daily.    [provider]  cyclobenzaprine (FLEXERIL) 10 MG tablet Take 10 mg by mouth 3 (three) times daily. 12/23/21   [provider]  fluticasone (FLONASE) 50 MCG/ACT nasal spray Place 2 sprays into both nostrils daily. 04/21/22   Leath-Warren, Sadie Haber, NP  ipratropium (ATROVENT) 0.06 % nasal spray Place 1 spray into both nostrils daily as needed for rhinitis. 01/02/21   [provider]  levothyroxine (SYNTHROID)  100 MCG tablet Take 100 mcg by mouth daily before breakfast.    [provider]  lisinopril (ZESTRIL) 5 MG tablet TAKE 1 TABLET(5 MG) BY MOUTH DAILY 10/03/21   Antoine Poche, MD  metoprolol succinate (TOPROL-XL) 50 MG 24 hr tablet TAKE 1 AND 1/2 TABLETS(75 MG) BY MOUTH DAILY WITH OR IMMEDIATELY FOLLOWING A MEAL 10/03/21   Antoine Poche, MD  Multiple Vitamin (MULTIVITAMIN) tablet Take 1 tablet by mouth daily.    [provider]  mupirocin ointment (BACTROBAN) 2 % Apply 1 application topically 2 (two) times daily. 06/24/21   Wurst, [place], PA-C  nitroGLYCERIN (NITROSTAT) 0.4 MG SL tablet Place 1 tablet (0.4 mg total) under the tongue every 5 (five) minutes as needed for chest pain. 10/14/19   Strader, Lennart Pall, PA-C  Omega-3 Fatty Acids (FISH OIL) 1200 MG CAPS Take 1,200 mg by mouth daily.    [provider]  oxymetazoline (AFRIN) 0.05 % nasal spray Place 1 spray into both nostrils daily as needed for congestion.    [provider]  Vitamin D, Ergocalciferol, (DRISDOL) 1.25 MG (50000 UNIT) CAPS capsule Take 50,000 Units by mouth once a week. 11/03/20   [provider]      Allergies    Erythromycin and Penicillins    Review of Systems   Review of Systems  Gastrointestinal:  Positive for abdominal pain.    Physical Exam Updated Vital Signs BP 109/85   Pulse  73   Temp 98.9 F (37.2 C) (Oral)   Resp 16   Ht 6\' 1"  (1.854 m)   Wt 117.9 kg   SpO2 99%   BMI 34.30 kg/m  Physical Exam  ED Results / Procedures / Treatments   Labs (all labs ordered are listed, but only abnormal results are displayed) Labs Reviewed  COMPREHENSIVE METABOLIC PANEL - Abnormal; Notable for the following components:      Result Value   Total Protein 8.2 (*)    All other components within normal limits  URINALYSIS, ROUTINE W REFLEX MICROSCOPIC - Abnormal; Notable for the following components:   Ketones, ur 20 (*)    All other components within normal  limits  LIPASE, BLOOD  CBC  TROPONIN I (HIGH SENSITIVITY)  TROPONIN I (HIGH SENSITIVITY)    EKG EKG Interpretation  Date/Time:  Monday June 26 2022 21:10:34 EDT Ventricular Rate:  76 PR Interval:  199 QRS Duration: 96 QT Interval:  386 QTC Calculation: 434 R Axis:   15 Text Interpretation: Sinus rhythm RSR' in V1 or V2, right VCD or RVH Borderline ST elevation, anterolateral leads Confirmed by 04[birth year]1984 [phone]) on 06/26/2022 10:47:02 PM  Radiology CT ABDOMEN PELVIS W CONTRAST  Result Date: 06/26/2022 CLINICAL DATA:  Right upper quadrant pain for 1 week EXAM: CT ABDOMEN AND PELVIS WITH CONTRAST TECHNIQUE: Multidetector CT imaging of the abdomen and pelvis was performed using the standard protocol following bolus administration of intravenous contrast. RADIATION DOSE REDUCTION: This exam was performed according to the departmental dose-optimization program which includes automated exposure control, adjustment of the mA and/or kV according to patient size and/or use of iterative reconstruction technique. CONTRAST:  06/28/2022 OMNIPAQUE IOHEXOL 300 MG/ML  SOLN COMPARISON:  05/27/2020 ultrasound FINDINGS: Lower chest: No acute abnormality. Hepatobiliary: No focal liver abnormality is seen. No gallstones, gallbladder wall thickening, or biliary dilatation. Pancreas: Unremarkable. No pancreatic ductal dilatation or surrounding inflammatory changes. Spleen: Normal in size without focal abnormality. Adrenals/Urinary Tract: Adrenal glands are within normal limits. Kidneys are well visualized and demonstrate a normal enhancement pattern. No renal calculi or obstructive changes are seen. 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 The bladder is decompressed. Stomach/Bowel: No obstructive or inflammatory changes of the colon are seen. The appendix has been surgically removed. Small bowel and stomach are unremarkable. Vascular/Lymphatic: Aortic atherosclerosis. No enlarged abdominal  or pelvic lymph nodes. Reproductive: Prostate is unremarkable. Other: No abdominal wall hernia or abnormality. No abdominopelvic ascites. Musculoskeletal: No acute or significant osseous findings. IMPRESSION: No acute abnormality noted to correspond with the given clinical history. Electronically Signed   By: 07/28/2020 M.D.   On: 06/26/2022 21:56    Procedures Procedures  {Document cardiac monitor, telemetry assessment procedure when appropriate:1}  Medications Ordered in ED Medications  ondansetron [facility]) injection 4 mg (4 mg Intravenous Given 06/26/22 2118)  pantoprazole (PROTONIX) injection 40 mg (40 mg Intravenous Given 06/26/22 2118)  iohexol (OMNIPAQUE) 300 MG/ML solution 100 mL (100 mLs Intravenous Contrast Given 06/26/22 2148)    ED Course/ Medical Decision Making/ A&P                           Medical Decision Making Amount and/or Complexity of Data Reviewed Labs: ordered. Radiology: ordered. ECG/medicine tests: ordered.  Risk Prescription drug management.   Labs unremarkable.  Patient will be placed on Protonix for abdominal pain and follow-up with his GI doctor  {Document critical care time when appropriate:1} {  Document review of labs and clinical decision tools ie heart score, Chads2Vasc2 etc:1}  {Document your independent review of radiology images, and any outside records:1} {Document your discussion with family members, caretakers, and with consultants:1} {Document social determinants of health affecting pt's care:1} {Document your decision making why or why not admission, treatments were needed:1} Final Clinical Impression(s) / ED Diagnoses Final diagnoses:  Pain of upper abdomen    Rx / DC Orders ED Discharge Orders          Ordered    pantoprazole (PROTONIX) 20 MG tablet  Daily        06/26/22 2348

## 2022-06-26 NOTE — ED Notes (Signed)
Pt returned from CT °

## 2022-06-27 ENCOUNTER — Ambulatory Visit: Payer: Commercial Managed Care - PPO | Admitting: Gastroenterology

## 2022-06-27 ENCOUNTER — Telehealth: Payer: Self-pay | Admitting: Internal Medicine

## 2022-06-27 NOTE — Telephone Encounter (Signed)
Last saw patient in 2022 for colorectal cancer screening GERD/dysphagia.  EGD negative empirically dilated distal esophagus.  Colonoscopy also negative recommend repeat 10 years.  Labs from outside placed on my desk for review there from [facility] from 02/08/2022 hepatic function profile completely normal TSH normal.  CBC completely normal, metabolic profile glucose 105 borderline triglycerides 175 HDL 35 LDL 75 total cholesterol 140 PSA 1.0 TSH 3.23  If not already being done, he should strive for physical fitness, regular aerobic exercise, weight loss to improve lipid profile and blood sugar.  He appears to be overdue for follow-up with [place].

## 2022-06-27 NOTE — Telephone Encounter (Signed)
He is scheduled to see [facility] tomorrow, will forward recommendations to her.

## 2022-06-28 ENCOUNTER — Ambulatory Visit (HOSPITAL_COMMUNITY)
Admission: RE | Admit: 2022-06-28 | Discharge: 2022-06-28 | Disposition: A | Discharge status: Home / Self Care | Payer: Commercial Managed Care - PPO | Source: Ambulatory Visit | Attending: Gastroenterology | Admitting: Gastroenterology

## 2022-06-28 ENCOUNTER — Ambulatory Visit (INDEPENDENT_AMBULATORY_CARE_PROVIDER_SITE_OTHER): Payer: Commercial Managed Care - PPO | Admitting: Gastroenterology

## 2022-06-28 ENCOUNTER — Encounter: Payer: Self-pay | Admitting: Gastroenterology

## 2022-06-28 VITALS — BP 105/68 | HR 78 | Temp 97.8°F | Ht 73.0 in | Wt 265.6 lb

## 2022-06-28 DIAGNOSIS — R1011 Right upper quadrant pain: Secondary | ICD-10-CM | POA: Insufficient documentation

## 2022-06-28 DIAGNOSIS — K219 Gastro-esophageal reflux disease without esophagitis: Secondary | ICD-10-CM | POA: Insufficient documentation

## 2022-06-28 DIAGNOSIS — I77819 Aortic ectasia, unspecified site: Secondary | ICD-10-CM | POA: Insufficient documentation

## 2022-06-28 NOTE — Progress Notes (Signed)
GI Office Note    Referring Provider: Assunta Found, MD Primary Care Physician:  Assunta Found, MD  Primary Gastroenterologist: Roetta Sessions, MD   Chief Complaint   Chief Complaint  Patient presents with   Abdominal Pain    Pressure under right rib cage. Pt was seen in ER this past Monday.     History of Present Illness   Bill Castro is a 61 y.o. male presenting today for urgent office visit for abdominal pain.  He has a history of GERD, constipation, fatty liver.  Patient seen in the ED 2 days ago for right upper quadrant abdominal pain.  CT abdomen pelvis with contrast with no findings to explain abdominal pain.  Normal lipase, LFTs, CBC, troponin I x2.  No blood in the urine.  EGD and colonoscopy 2022 showed normal esophagus status post dilation for history of dysphagia, non-H. pylori gastritis. Colonic diverticulosis.  Last Monday started having diarrhea really bad and had a lot of pressure in ruq region. Sometimes into back. Kept happening through the week.  Then this Monday, even worse, sharp stabbing pain. Ruq into right flank and into rlq. Colicky/crampy in nature. Pushing on stomach does not hurt. Also with some indigestion/need to belch after meals but can't. Little abdominal pain with meals. BMs ok. States he has not been on omeprazole for awhile, didn't seem to be helping his chronic symptoms. Recently had a lot of heartburn. Given pantoprazole 20mg  RX when in the ED last and just recently picked it up.   Patient states he has a lot of back issues and needs lumbar spine surgery. His provider thinks he has some thoracic issues to but currently monitoring, but may end up with MRI thoracic spine. Next follow up is in 11/2022. He wonders if symptoms are due to his back. Patient states he was on celebrex for awhile but had to stop due to his stomach, he has been off for several months. Currently using Tylenol for back pain.   Medications   Current Outpatient  Medications  Medication Sig Dispense Refill   acetaminophen (TYLENOL) 500 MG tablet Take 1,000 mg by mouth every 6 (six) hours as needed (back pain).      ALPRAZolam (XANAX) 1 MG tablet Take 1 mg by mouth 2 (two) times daily as needed. Usually takes 1 tablet at bedtime  2   aspirin EC 81 MG tablet Take 81 mg by mouth daily with lunch.      atorvastatin (LIPITOR) 20 MG tablet TAKE 1 TABLET(20 MG) BY MOUTH DAILY 90 tablet 3   cetirizine (ZYRTEC) 10 MG tablet Take 1 tablet (10 mg total) by mouth daily. 30 tablet 0   cholecalciferol (VITAMIN D3) 25 MCG (1000 UNIT) tablet Take 1,000 Units by mouth daily.     fluticasone (FLONASE) 50 MCG/ACT nasal spray Place 2 sprays into both nostrils daily. 16 g 0   levothyroxine (SYNTHROID) 100 MCG tablet Take 100 mcg by mouth daily before breakfast.     lisinopril (ZESTRIL) 5 MG tablet TAKE 1 TABLET(5 MG) BY MOUTH DAILY 90 tablet 3   metoprolol succinate (TOPROL-XL) 50 MG 24 hr tablet TAKE 1 AND 1/2 TABLETS(75 MG) BY MOUTH DAILY WITH OR IMMEDIATELY FOLLOWING A MEAL 135 tablet 3   nitroGLYCERIN (NITROSTAT) 0.4 MG SL tablet Place 1 tablet (0.4 mg total) under the tongue every 5 (five) minutes as needed for chest pain. 25 tablet 3   Omega-3 Fatty Acids (FISH OIL) 1200 MG CAPS Take 1,200 mg  by mouth daily.     pantoprazole (PROTONIX) 20 MG tablet Take 1 tablet (20 mg total) by mouth daily. (Patient not taking: Reported on 06/28/2022) 30 tablet 0   No current facility-administered medications for this visit.    Allergies   Allergies as of 06/28/2022 - Review Complete 06/28/2022  Allergen Reaction Noted   Erythromycin Hives and Swelling 01/19/2012   Penicillins Other (See Comments) 01/19/2012      Review of Systems   General: Negative for anorexia, weight loss, fever, chills, fatigue, weakness. ENT: Negative for hoarseness, difficulty swallowing , nasal congestion. CV: Negative for chest pain, angina, palpitations, dyspnea on exertion, peripheral edema.   Respiratory: Negative for dyspnea at rest, dyspnea on exertion, cough, sputum, wheezing.  GI: See history of present illness. GU:  Negative for dysuria, hematuria, urinary incontinence, urinary frequency, nocturnal urination.  Endo: Negative for unusual weight change.     Physical Exam   BP 105/68 (BP Location: Left Arm, Patient Position: Sitting, Cuff Size: Normal)   Pulse 78   Temp 97.8 F (36.6 C) (Temporal)   Ht 6\' 1"  (1.854 m)   Wt 265 lb 9.6 oz (120.5 kg)   SpO2 96%   BMI 35.04 kg/m    General: Well-nourished, well-developed in no acute distress.  Eyes: No icterus. Mouth: Oropharyngeal mucosa moist and pink , no lesions erythema or exudate. Lungs: Clear to auscultation bilaterally.  Heart: Regular rate and rhythm, no murmurs rubs or gallops.  Abdomen: Bowel sounds are normal, nontender, nondistended, no hepatosplenomegaly or masses,  no abdominal bruits or hernia , no rebound or guarding.  Rectal: not performed Extremities: No lower extremity edema. No clubbing or deformities. Neuro: Alert and oriented x 4   Skin: Warm and dry, no jaundice.   Psych: Alert and cooperative, normal mood and affect.  Labs   Lab Results  Component Value Date   CREATININE 1.19 06/26/2022   BUN 17 06/26/2022   NA 137 06/26/2022   K 4.4 06/26/2022   CL 104 06/26/2022   CO2 24 06/26/2022   Lab Results  Component Value Date   LIPASE 28 06/26/2022   Lab Results  Component Value Date   ALT 19 06/26/2022   AST 17 06/26/2022   ALKPHOS 54 06/26/2022   BILITOT 1.0 06/26/2022   Lab Results  Component Value Date   WBC 8.9 06/26/2022   HGB 16.3 06/26/2022   HCT 47.5 06/26/2022   MCV 93.9 06/26/2022   PLT 287 06/26/2022    Imaging Studies   CT ABDOMEN PELVIS W CONTRAST  Result Date: 06/26/2022 CLINICAL DATA:  Right upper quadrant pain for 1 week EXAM: CT ABDOMEN AND PELVIS WITH CONTRAST TECHNIQUE: Multidetector CT imaging of the abdomen and pelvis was performed using the standard  protocol following bolus administration of intravenous contrast. RADIATION DOSE REDUCTION: This exam was performed according to the departmental dose-optimization program which includes automated exposure control, adjustment of the mA and/or kV according to patient size and/or use of iterative reconstruction technique. CONTRAST:  06/28/2022 OMNIPAQUE IOHEXOL 300 MG/ML  SOLN COMPARISON:  05/27/2020 ultrasound FINDINGS: Lower chest: No acute abnormality. Hepatobiliary: No focal liver abnormality is seen. No gallstones, gallbladder wall thickening, or biliary dilatation. Pancreas: Unremarkable. No pancreatic ductal dilatation or surrounding inflammatory changes. Spleen: Normal in size without focal abnormality. Adrenals/Urinary Tract: Adrenal glands are within normal limits. Kidneys are well visualized and demonstrate a normal enhancement pattern. No renal calculi or obstructive changes are seen. 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 The bladder is decompressed. Stomach/Bowel: No obstructive or inflammatory changes of the colon are seen. The appendix has been surgically removed. Small bowel and stomach are unremarkable. Vascular/Lymphatic: Aortic atherosclerosis. No enlarged abdominal or pelvic lymph nodes. Reproductive: Prostate is unremarkable. Other: No abdominal wall hernia or abnormality. No abdominopelvic ascites. Musculoskeletal: No acute or significant osseous findings. IMPRESSION: No acute abnormality noted to correspond with the given clinical history. Electronically Signed   By: Alcide Clever M.D.   On: 06/26/2022 21:56   CUP PACEART REMOTE DEVICE CHECK  Result Date: 06/01/2022 Scheduled remote reviewed. Normal device function.  Next remote 91 days. Hassell Halim, RN, CCDS, [employer]   Assessment   RUQ pain: recent acute onset, somewhat of a postprandial component but difficult to determine clear association has he has some baseline GERD, history of gastritis, and  recent NSAID use. Also with chronic back issues. Exam benign. CT A/P reassuring. Symptoms could be biliary in nature, would recommend complete gallbladder work up. Treat with PPI for possible gastritis/GERD. If no improvement, then he may need to consider possibility of referred back pain.   GERD: intermittent symptoms with recent flare off PPI.   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 No abnormality noted on recent CT. Would continue to follow.    PLAN   Abdominal ultrasound. Take pantoprazole RX provided by ED but would take two each morning.  Cut back on greasy/fatty foods.    Leanna Battles. Melvyn Neth, MHS, PA-C [facility]

## 2022-06-28 NOTE — Patient Instructions (Signed)
Abdominal ultrasound as ordered. Take pantoprazole 2 tablets once daily (total of 40mg ).  Cut back on fatty/greasy foods for now.

## 2022-07-03 ENCOUNTER — Telehealth: Payer: Self-pay

## 2022-07-03 DIAGNOSIS — R1011 Right upper quadrant pain: Secondary | ICD-10-CM

## 2022-07-03 MED ORDER — PANTOPRAZOLE SODIUM 40 MG PO TBEC: 40.0000 mg | DELAYED_RELEASE_TABLET | Freq: Every day | ORAL | 3 refills | Status: DC

## 2022-07-03 NOTE — Telephone Encounter (Signed)
Pt called stating that he would like to move forward with Leslie's recommendation on scheduling a HIDA scan to complete gallbladder work up. Pt also states that he has been taking the protonix 40 mg once daily since he seen Ransom on 06/28/22. States that it has helped some, but not completely. Will need a refill sent in to [facility] on [address] as well. Routing to Dr. Jena Gauss in Leslie's absence since pt seen Dr. Jena Gauss the time before.

## 2022-07-03 NOTE — Telephone Encounter (Signed)
PA submitted via [employer]. Case ID# [id number] will await approval/denial prior to scheduling.

## 2022-07-03 NOTE — Addendum Note (Signed)
Addended by: Armstead Peaks on: 07/03/2022 12:59 PM   Modules accepted: Orders

## 2022-07-03 NOTE — Telephone Encounter (Signed)
RX for pantoprazole 40mg  sent to [facility]  Ok to schedule HIDA with fatty meal challenge for ruq pain.

## 2022-07-03 NOTE — Addendum Note (Signed)
Addended by: Tiffany Kocher on: 07/03/2022 11:40 AM   Modules accepted: Orders

## 2022-07-04 NOTE — Telephone Encounter (Signed)
Per UMR "No auth required". Called nuc med, no answer

## 2022-07-04 NOTE — Telephone Encounter (Signed)
HIDA scheduled for 8/10, arrival 7:45am, npo midnight, no pain meds.  Called pt and is aware of appt details.

## 2022-07-06 ENCOUNTER — Encounter (HOSPITAL_COMMUNITY): Payer: Self-pay

## 2022-07-06 ENCOUNTER — Encounter (HOSPITAL_COMMUNITY)
Admission: RE | Admit: 2022-07-06 | Discharge: 2022-07-06 | Disposition: A | Discharge status: Home / Self Care | Payer: Commercial Managed Care - PPO | Source: Ambulatory Visit | Attending: Gastroenterology | Admitting: Gastroenterology

## 2022-07-06 DIAGNOSIS — R1011 Right upper quadrant pain: Secondary | ICD-10-CM | POA: Diagnosis present

## 2022-07-06 MED ORDER — TECHNETIUM TC 99M MEBROFENIN IV KIT
5.0000 | PACK | Freq: Once | INTRAVENOUS | Status: AC | PRN
  Administered 2022-07-06: 5.4 via INTRAVENOUS

## 2022-07-12 ENCOUNTER — Encounter: Payer: Self-pay | Admitting: Cardiology

## 2022-07-13 ENCOUNTER — Telehealth: Payer: Self-pay

## 2022-07-13 NOTE — Telephone Encounter (Signed)
Patient called in and left vm stating he sent in a transmission because he is having chest pain all over his chest. He was told from another doctor he has a possible disc in spine and that can be causing his pain. Patient just wants to know if anything was seen on his transmission that was alarming.

## 2022-07-13 NOTE — Telephone Encounter (Signed)
Patient calling back to follow up. He says it is okay to also send a message on mychart. He would like to know if he needs to be seen, because he is off on Friday. He says he will go to the hospital if an emergency happens.

## 2022-07-13 NOTE — Telephone Encounter (Signed)
Noted  

## 2022-07-13 NOTE — Telephone Encounter (Addendum)
Returned phone call.   Reports of intermittent chest pain that started on Monday 07/10/22, denies any pain today. States pain has been on the right side of his chest previously but this week started in the left side of his chest. Reports of back issues (known disc problems). Patient reports pain improvement with change in position. Reports yesterday he experienced several episodes of left sided chest pain that radiated from chest around to back. Describes pain as "fleeting".  Patient states his neurosurgeon told patient he thought his pain in chest was due to his disc issue.   Remote transmission reviewed and shows normal device function. No episodes noted and lead parameters are stable.   Advised patient his device looks normal but I will send to the [facility] triage clinic to review.  ED precautions given to patient with verbal understanding.

## 2022-07-13 NOTE — Telephone Encounter (Signed)
Responded to pt via MyChart

## 2022-07-14 ENCOUNTER — Ambulatory Visit: Payer: Commercial Managed Care - PPO | Admitting: Internal Medicine

## 2022-07-14 NOTE — Telephone Encounter (Signed)
The device clinic would be the ones to receive his transmssion, I am not able to see the data until they review it and generate a report. With recent normal stress test I do not think symptoms would be related to any new blockages. Can we call the device clinic for him and see if they received the data   J Tyrea Froberg MD

## 2022-08-25 ENCOUNTER — Telehealth: Payer: Self-pay

## 2022-08-25 NOTE — Telephone Encounter (Signed)
Pt left a voicemail wanting to cancel is 08/31/2022 transmission because he see Dr. Lovena Le on 09/01/2022.   I LMOVM to let patient know that I did cancel the remote transmission. Left device clinic number for him to call back with any questions.

## 2022-08-31 ENCOUNTER — Encounter: Payer: Self-pay | Admitting: Cardiology

## 2022-08-31 ENCOUNTER — Ambulatory Visit: Payer: Commercial Managed Care - PPO | Attending: Cardiology | Admitting: Cardiology

## 2022-08-31 VITALS — BP 128/88 | HR 99 | Ht 73.0 in | Wt 248.4 lb

## 2022-08-31 DIAGNOSIS — I10 Essential (primary) hypertension: Secondary | ICD-10-CM | POA: Diagnosis not present

## 2022-08-31 DIAGNOSIS — I2510 Atherosclerotic heart disease of native coronary artery without angina pectoris: Secondary | ICD-10-CM | POA: Diagnosis not present

## 2022-08-31 DIAGNOSIS — E782 Mixed hyperlipidemia: Secondary | ICD-10-CM

## 2022-08-31 NOTE — Patient Instructions (Signed)
Medication Instructions:  Your physician recommends that you continue on your current medications as directed. Please refer to the Current Medication list given to you today.   Labwork: None  Testing/Procedures: None  Follow-Up: Follow up in 6 months with Dr. Branch  Any Other Special Instructions Will Be Listed Below (If Applicable).     If you need a refill on your cardiac medications before your next appointment, please call your pharmacy.  
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
## 2022-09-01 ENCOUNTER — Ambulatory Visit: Payer: Commercial Managed Care - PPO | Attending: Internal Medicine | Admitting: Internal Medicine

## 2022-09-01 ENCOUNTER — Encounter: Payer: Self-pay | Admitting: Internal Medicine

## 2022-09-01 VITALS — BP 134/92 | HR 86 | Ht 73.0 in | Wt 249.0 lb

## 2022-09-01 DIAGNOSIS — I442 Atrioventricular block, complete: Secondary | ICD-10-CM

## 2022-09-01 DIAGNOSIS — Z950 Presence of cardiac pacemaker: Secondary | ICD-10-CM | POA: Diagnosis not present

## 2022-09-01 NOTE — Patient Instructions (Addendum)
Medication Instructions:  Your physician recommends that you continue on your current medications as directed. Please refer to the Current Medication list given to you today.  *If you need a refill on your cardiac medications before your next appointment, please call your pharmacy*  Lab Work: None ordered.  If you have labs (blood work) drawn today and your tests are completely normal, you will receive your results only by: [place] (if you have MyChart) OR A paper copy in the mail If you have any lab test that is abnormal or we need to change your treatment, we will call you to review the results.  Testing/Procedures: None ordered.  Follow-Up: At [facility], you and your health needs are our priority.  As part of our continuing mission to provide you with exceptional heart care, we have created designated Provider Care Teams.  These Care Teams include your primary Cardiologist (physician) and Advanced Practice Providers (APPs -  Physician Assistants and Nurse Practitioners) who all work together to provide you with the care you need, when you need it.  We recommend signing up for the patient portal called "MyChart".  Sign up information is provided on this After Visit Summary.  MyChart is used to connect with patients for Virtual Visits (Telemedicine).  Patients are able to view lab/test results, encounter notes, upcoming appointments, etc.  Non-urgent messages can be sent to your provider as well.   To learn more about what you can do with MyChart, go to [email or web address].    Your next appointment:   1 year(s) in [place] with Dr. Cristopher Peru   The format for your next appointment:   In Person  Provider:   Cristopher Peru, MD{or one of the following Advanced Practice Providers on your designated Care Team:   Tommye Standard, [place] Legrand Como "Jonni Sanger" Chalmers Cater, [place]  Remote monitoring is used to monitor your Pacemaker from home. This monitoring reduces the number of office  visits required to check your device to one time per year. It allows [place] to keep an eye on the functioning of your device to ensure it is working properly. You are scheduled for a device check from home on 11/30/22. You may send your transmission at any time that day. If you have a wireless device, the transmission will be sent automatically. After your physician reviews your transmission, you will receive a postcard with your next transmission date.  Important Information About Sugar
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
## 2022-09-11 ENCOUNTER — Other Ambulatory Visit: Payer: Self-pay | Admitting: Cardiology

## 2022-09-25 ENCOUNTER — Other Ambulatory Visit: Payer: Self-pay | Admitting: Cardiology

## 2022-10-16 ENCOUNTER — Ambulatory Visit
Admission: EM | Admit: 2022-10-16 | Discharge: 2022-10-16 | Disposition: A | Discharge status: Home / Self Care | Payer: Commercial Managed Care - PPO | Attending: Physician Assistant | Admitting: Physician Assistant

## 2022-10-16 DIAGNOSIS — B349 Viral infection, unspecified: Secondary | ICD-10-CM | POA: Insufficient documentation

## 2022-10-16 DIAGNOSIS — Z1152 Encounter for screening for COVID-19: Secondary | ICD-10-CM | POA: Insufficient documentation

## 2022-10-16 LAB — RESP PANEL BY RT-PCR (RSV, FLU A&B, COVID)  RVPGX2
Influenza A by PCR: NEGATIVE
Influenza B by PCR: NEGATIVE
Resp Syncytial Virus by PCR: NEGATIVE
SARS Coronavirus 2 by RT PCR: NEGATIVE
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
## 2022-10-16 NOTE — ED Triage Notes (Signed)
Pt reports last night he was sneezing really bad. He felt horrible and feels achy. He has been sneezing like crazy and runny nose. Complains of some pressure in his eyes and right ear popping.  Took coricidin but no relief.   Robbie Lis med gave him torridal and dexadron on 11/10.

## 2022-10-16 NOTE — Discharge Instructions (Signed)
Tylenol as needed.

## 2022-11-30 ENCOUNTER — Ambulatory Visit (INDEPENDENT_AMBULATORY_CARE_PROVIDER_SITE_OTHER): Payer: Commercial Managed Care - PPO

## 2022-11-30 DIAGNOSIS — I442 Atrioventricular block, complete: Secondary | ICD-10-CM

## 2022-11-30 LAB — CUP PACEART REMOTE DEVICE CHECK
Battery Remaining Longevity: 124 mo
Battery Voltage: 2.94 V
Brady Statistic AP VP Percent: 1.48 %
Brady Statistic AP VS Percent: 2.05 %
Brady Statistic AS VP Percent: 0.03 %
Brady Statistic AS VS Percent: 96.44 %
Brady Statistic RA Percent Paced: 3.54 %
Brady Statistic RV Percent Paced: 1.5 %
Date Time Interrogation Session: 20240103233945
Implantable Lead Connection Status: 753985
Implantable Lead Connection Status: 753985
Implantable Lead Implant Date: 20190123
Implantable Lead Implant Date: 20190123
Implantable Lead Location: 753859
Implantable Lead Location: 753860
Implantable Lead Model: 5076
Implantable Lead Model: 5076
Implantable Pulse Generator Implant Date: 20190123
Lead Channel Impedance Value: 323 Ohm
Lead Channel Impedance Value: 342 Ohm
Lead Channel Impedance Value: 399 Ohm
Lead Channel Impedance Value: 437 Ohm
Lead Channel Pacing Threshold Amplitude: 0.875 V
Lead Channel Pacing Threshold Amplitude: 1 V
Lead Channel Pacing Threshold Pulse Width: 0.4 ms
Lead Channel Pacing Threshold Pulse Width: 0.4 ms
Lead Channel Sensing Intrinsic Amplitude: 12.5 mV
Lead Channel Sensing Intrinsic Amplitude: 12.5 mV
Lead Channel Sensing Intrinsic Amplitude: 2.75 mV
Lead Channel Sensing Intrinsic Amplitude: 2.75 mV
Lead Channel Setting Pacing Amplitude: 2 V
Lead Channel Setting Pacing Amplitude: 2.5 V
Lead Channel Setting Pacing Pulse Width: 0.4 ms
Lead Channel Setting Sensing Sensitivity: 1.2 mV
Zone Setting Status: 755011
Zone Setting Status: 755011

## 2022-12-02 ENCOUNTER — Emergency Department (HOSPITAL_COMMUNITY): Payer: Commercial Managed Care - PPO

## 2022-12-02 ENCOUNTER — Encounter (HOSPITAL_COMMUNITY): Payer: Self-pay

## 2022-12-02 ENCOUNTER — Emergency Department (HOSPITAL_COMMUNITY)
Admission: EM | Admit: 2022-12-02 | Discharge: 2022-12-03 | Disposition: A | Discharge status: Home / Self Care | Payer: Commercial Managed Care - PPO | Attending: Emergency Medicine | Admitting: Emergency Medicine

## 2022-12-02 ENCOUNTER — Other Ambulatory Visit: Payer: Self-pay

## 2022-12-02 DIAGNOSIS — R1013 Epigastric pain: Secondary | ICD-10-CM | POA: Diagnosis present

## 2022-12-02 DIAGNOSIS — I2510 Atherosclerotic heart disease of native coronary artery without angina pectoris: Secondary | ICD-10-CM | POA: Insufficient documentation

## 2022-12-02 DIAGNOSIS — Z7982 Long term (current) use of aspirin: Secondary | ICD-10-CM | POA: Diagnosis not present

## 2022-12-02 DIAGNOSIS — Z79899 Other long term (current) drug therapy: Secondary | ICD-10-CM | POA: Diagnosis not present

## 2022-12-02 DIAGNOSIS — R0789 Other chest pain: Secondary | ICD-10-CM | POA: Diagnosis not present

## 2022-12-02 LAB — BASIC METABOLIC PANEL
Anion gap: 7 (ref 5–15)
BUN: 10 mg/dL (ref 8–23)
CO2: 24 mmol/L (ref 22–32)
Calcium: 8.4 mg/dL — ABNORMAL LOW (ref 8.9–10.3)
Chloride: 107 mmol/L (ref 98–111)
Creatinine, Ser: 1.05 mg/dL (ref 0.61–1.24)
GFR, Estimated: >60 mL/min (ref >60)
Glucose, Bld: 97 mg/dL (ref 70–99)
Potassium: 4.2 mmol/L (ref 3.5–5.1)
Sodium: 138 mmol/L (ref 135–145)

## 2022-12-02 LAB — TROPONIN I (HIGH SENSITIVITY)
Troponin I (High Sensitivity): 5 ng/L (ref <18)
Troponin I (High Sensitivity): 6 ng/L (ref <18)

## 2022-12-02 LAB — CBC
HCT: 43.7 % (ref 39.0–52.0)
Hemoglobin: 14.6 g/dL (ref 13.0–17.0)
MCH: 33 pg (ref 26.0–34.0)
MCHC: 33.4 g/dL (ref 30.0–36.0)
MCV: 98.6 fL (ref 80.0–100.0)
Platelets: 278 10*3/uL (ref 150–400)
RBC: 4.43 MIL/uL (ref 4.22–5.81)
RDW: 12.7 % (ref 11.5–15.5)
WBC: 8.3 10*3/uL (ref 4.0–10.5)
nRBC: 0 % (ref 0.0–0.2)

## 2022-12-02 MED ORDER — IOHEXOL 350 MG/ML SOLN
100.0000 mL | Freq: Once | INTRAVENOUS | Status: AC | PRN
  Administered 2022-12-02: 100 mL via INTRAVENOUS

## 2022-12-02 NOTE — ED Triage Notes (Signed)
Pt developed back pain x 1 week ago and saw a Chief of Staff and has disc problems.  Pt reports the pain in his back comes through to the chest like a pressure feeling and the neuro doctor told him to see his cardiologist to make sure the chest pressure wasn't something else unrelated to the back pain.  Pt has an appointment on Monday but was anxious to wait that long.

## 2022-12-02 NOTE — ED Provider Notes (Incomplete)
[facility] Provider Note   CSN: [id number] Arrival date & time: 12/02/22  2018     History {Add pertinent medical, surgical, social history, OB history to HPI:1} Chief Complaint  Patient presents with   PRESSURE IN CHEST    BURNICE OESTREICHER is a 62 y.o. male.  Patient has a history of coronary artery disease.  Patient complains of some epigastric discomfort that goes to his back   Chest Pain      Home Medications Prior to Admission medications   Medication Sig Start Date End Date Taking? Authorizing Provider  acetaminophen (TYLENOL) 500 MG tablet Take 1,000 mg by mouth every 6 (six) hours as needed (back pain).     [provider]  ALPRAZolam Duanne Moron) 1 MG tablet Take 1 mg by mouth 2 (two) times daily as needed. Usually takes 1 tablet at bedtime 12/28/17   [provider]  aspirin EC 81 MG tablet Take 81 mg by mouth daily with lunch.     [provider]  atorvastatin (LIPITOR) 20 MG tablet TAKE 1 TABLET(20 MG) BY MOUTH DAILY 09/11/22   Arnoldo Lenis, MD  cetirizine (ZYRTEC) 10 MG tablet Take 10 mg by mouth as needed for allergies.    [provider]  cholecalciferol (VITAMIN D3) 25 MCG (1000 UNIT) tablet Take 1,000 Units by mouth daily.    [provider]  fluticasone (FLONASE) 50 MCG/ACT nasal spray Place 1 spray into both nostrils as needed for allergies or rhinitis.    [provider]  levothyroxine (SYNTHROID) 100 MCG tablet Take 100 mcg by mouth daily before breakfast.    [provider]  lisinopril (ZESTRIL) 5 MG tablet TAKE 1 TABLET(5 MG) BY MOUTH DAILY 09/25/22   Arnoldo Lenis, MD  metoprolol succinate (TOPROL-XL) 50 MG 24 hr tablet TAKE 1 AND 1/2 TABLETS(75 MG) BY MOUTH DAILY WITH OR IMMEDIATELY FOLLOWING A MEAL 09/25/22   Branch, Alphonse Guild, MD  nitroGLYCERIN (NITROSTAT) 0.4 MG SL tablet Place 1 tablet (0.4 mg total) under the tongue every 5 (five) minutes as needed for chest  pain. 10/14/19   Strader, Fransisco Hertz, PA-C  Omega-3 Fatty Acids (FISH OIL) 1200 MG CAPS Take 1,200 mg by mouth daily.    [provider]  pantoprazole (PROTONIX) 40 MG tablet Take 40 mg by mouth as needed.    [provider]      Allergies    Erythromycin and Penicillins    Review of Systems   Review of Systems  Cardiovascular:  Positive for chest pain.    Physical Exam Updated Vital Signs BP 128/85   Pulse 84   Temp 98.6 F (37 C) (Oral)   Resp 16   Ht 6\' 1"  (1.854 m)   Wt 115.7 kg   SpO2 100%   BMI 33.64 kg/m  Physical Exam  ED Results / Procedures / Treatments   Labs (all labs ordered are listed, but only abnormal results are displayed) Labs Reviewed  BASIC METABOLIC PANEL - Abnormal; Notable for the following components:      Result Value   Calcium 8.4 (*)    All other components within normal limits  CBC  TROPONIN I (HIGH SENSITIVITY)  TROPONIN I (HIGH SENSITIVITY)    EKG None  Radiology CT Angio Chest/Abd/Pel for Dissection W and/or Wo Contrast  Result Date: 12/02/2022 CLINICAL DATA:  Acute aortic syndrome suspected. Back pain. Chest pressure. EXAM: CT ANGIOGRAPHY CHEST, ABDOMEN AND PELVIS TECHNIQUE: Noncontrast chest CT was performed Multidetector  CT imaging through the chest, abdomen and pelvis was performed using the standard protocol during bolus administration of intravenous contrast. Multiplanar reconstructed images and MIPs were obtained and reviewed to evaluate the vascular anatomy. RADIATION DOSE REDUCTION: This exam was performed according to the departmental dose-optimization program which includes automated exposure control, adjustment of the mA and/or kV according to patient size and/or use of iterative reconstruction technique. CONTRAST:  143mL OMNIPAQUE IOHEXOL 350 MG/ML SOLN COMPARISON:  CT abdomen and pelvis 06/26/2022 FINDINGS: CTA CHEST FINDINGS Cardiovascular: Preferential opacification of the thoracic aorta. No evidence of  thoracic aortic aneurysm or dissection. Normal heart size. No pericardial effusion. 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 There are minimal atherosclerotic calcifications throughout the aorta. Heart is normal in size. There is no pericardial effusion. Left-sided pacemaker is present. Mediastinum/Nodes: No enlarged mediastinal, hilar, or axillary lymph nodes. Thyroid gland, trachea, and esophagus demonstrate no significant findings. Lungs/Pleura: Lungs are clear. No pleural effusion or pneumothorax. Musculoskeletal: No chest wall abnormality. No acute or significant osseous findings. Review of the MIP images confirms the above findings. CTA ABDOMEN AND PELVIS FINDINGS VASCULAR Aorta: Normal caliber aorta without aneurysm, dissection, vasculitis or significant stenosis. Celiac: Patent without evidence of aneurysm, dissection, vasculitis or significant stenosis. SMA: Patent without evidence of aneurysm, dissection, vasculitis or significant stenosis. Renals: Both renal arteries are patent without evidence of aneurysm, dissection, vasculitis, fibromuscular dysplasia or significant stenosis. IMA: Patent without evidence of aneurysm, dissection, vasculitis or significant stenosis. Inflow: Patent without evidence of aneurysm, dissection, vasculitis or significant stenosis. Veins: No obvious venous abnormality within the limitations of this arterial phase study. Review of the MIP images confirms the above findings. NON-VASCULAR Hepatobiliary: No focal liver abnormality is seen. No gallstones, gallbladder wall thickening, or biliary dilatation. Pancreas: Unremarkable. No pancreatic ductal dilatation or surrounding inflammatory changes. Spleen: Normal in size without focal abnormality. Adrenals/Urinary Tract: Adrenal glands are unremarkable. Kidneys are normal, without renal calculi, focal lesion, or hydronephrosis. Bladder is unremarkable. Stomach/Bowel: Stomach is within normal limits. Appendix  is not seen. No evidence of bowel wall thickening, distention, or inflammatory changes. Lymphatic: No enlarged lymph nodes are identified. Reproductive: Prostate is unremarkable. Other: No abdominal wall hernia or abnormality. No abdominopelvic ascites. Musculoskeletal: Degenerative changes affect the lumbar spine. Review of the MIP images confirms the above findings. IMPRESSION: 1. No evidence for aortic dissection or aneurysm. 2. No acute process in the chest, abdomen or pelvis. Electronically Signed   By: Ronney Asters M.D.   On: 12/02/2022 22:20   DG Chest Port 1 View  Result Date: 12/02/2022 CLINICAL DATA:  Chest pain. EXAM: PORTABLE CHEST 1 VIEW COMPARISON:  October 05, 2019 FINDINGS: There is stable dual lead AICD positioning. The heart size and mediastinal contours are within normal limits. Both lungs are clear. Multilevel degenerative changes seen throughout the thoracic spine. IMPRESSION: No active cardiopulmonary disease. Electronically Signed   By: Virgina Norfolk M.D.   On: 12/02/2022 21:29    Procedures Procedures  {Document cardiac monitor, telemetry assessment procedure when appropriate:1}  Medications Ordered in ED Medications  iohexol (OMNIPAQUE) 350 MG/ML injection 100 mL (100 mLs Intravenous Contrast Given 12/02/22 2152)    ED Course/ Medical Decision Making/ A&P                           Medical Decision Making Amount and/or Complexity of Data Reviewed Labs: ordered. Radiology: ordered.  Risk Prescription drug management.   Patient with atypical  chest pain.  He will follow back up with cardiology for recheck on Monday  {Document critical care time when appropriate:1} {Document review of labs and clinical decision tools ie heart score, Chads2Vasc2 etc:1}  {Document your independent review of radiology images, and any outside records:1} {Document your discussion with family members, caretakers, and with consultants:1} {Document social determinants of health  affecting pt's care:1} {Document your decision making why or why not admission, treatments were needed:1} Final Clinical Impression(s) / ED Diagnoses Final diagnoses:  Atypical chest pain    Rx / DC Orders ED Discharge Orders     None

## 2022-12-02 NOTE — ED Notes (Signed)
Pt back from CT

## 2022-12-02 NOTE — Discharge Instructions (Signed)
Follow-up with your cardiologist as planned Monday.

## 2022-12-04 ENCOUNTER — Encounter: Payer: Self-pay | Admitting: Physician Assistant

## 2022-12-04 ENCOUNTER — Encounter: Payer: Self-pay | Admitting: Cardiology

## 2022-12-04 ENCOUNTER — Ambulatory Visit: Payer: Commercial Managed Care - PPO | Attending: Physician Assistant | Admitting: Physician Assistant

## 2022-12-04 VITALS — BP 124/66 | HR 89 | Ht 73.0 in | Wt 254.8 lb

## 2022-12-04 DIAGNOSIS — I10 Essential (primary) hypertension: Secondary | ICD-10-CM | POA: Diagnosis not present

## 2022-12-04 DIAGNOSIS — E785 Hyperlipidemia, unspecified: Secondary | ICD-10-CM

## 2022-12-04 DIAGNOSIS — Z950 Presence of cardiac pacemaker: Secondary | ICD-10-CM

## 2022-12-04 DIAGNOSIS — I2510 Atherosclerotic heart disease of native coronary artery without angina pectoris: Secondary | ICD-10-CM

## 2022-12-04 DIAGNOSIS — R079 Chest pain, unspecified: Secondary | ICD-10-CM

## 2022-12-04 MED ORDER — ISOSORBIDE MONONITRATE ER 30 MG PO TB24: 30.0000 mg | ORAL_TABLET | Freq: Every day | ORAL | 3 refills | Status: DC
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
## 2022-12-04 NOTE — Patient Instructions (Signed)
Medication Instructions:   Start Taking Imdur 30 mg Daily   *If you need a refill on your cardiac medications before your next appointment, please call your pharmacy*   Lab Work: NONE   If you have labs (blood work) drawn today and your tests are completely normal, you will receive your results only by: [place] (if you have MyChart) OR A paper copy in the mail If you have any lab test that is abnormal or we need to change your treatment, we will call you to review the results.   Testing/Procedures: NONE   Follow-Up: At [facility], you and your health needs are our priority.  As part of our continuing mission to provide you with exceptional heart care, we have created designated Provider Care Teams.  These Care Teams include your primary Cardiologist (physician) and Advanced Practice Providers (APPs -  Physician Assistants and Nurse Practitioners) who all work together to provide you with the care you need, when you need it.  We recommend signing up for the patient portal called "MyChart".  Sign up information is provided on this After Visit Summary.  MyChart is used to connect with patients for Virtual Visits (Telemedicine).  Patients are able to view lab/test results, encounter notes, upcoming appointments, etc.  Non-urgent messages can be sent to your provider as well.   To learn more about what you can do with MyChart, go to [email or web address].    Your next appointment:    April   The format for your next appointment:   In Person  Provider:   Carlyle Dolly, MD    Other Instructions Thank you for choosing [facility]!    Important Information About Sugar

## 2022-12-08 ENCOUNTER — Telehealth: Payer: Self-pay | Admitting: Cardiology

## 2022-12-08 NOTE — Telephone Encounter (Signed)
Patient would like for Dr. Harl Bowie to follow up on his appt and also to look at [facility] message that was sent. Please call back to discuss

## 2022-12-08 NOTE — Telephone Encounter (Signed)
I will forward to Dr.Branch for review. 

## 2022-12-10 ENCOUNTER — Encounter: Payer: Self-pay | Admitting: Cardiology

## 2022-12-11 ENCOUNTER — Telehealth: Payer: Commercial Managed Care - PPO | Admitting: Cardiology

## 2022-12-11 NOTE — Telephone Encounter (Signed)
Can we do a 220 virtual visit tomorrow to discuss, I think we can decide on what we need to just based on phone Cheryll Dessert MD

## 2022-12-11 NOTE — Telephone Encounter (Signed)
Message sent to patient

## 2022-12-12 ENCOUNTER — Ambulatory Visit: Payer: Commercial Managed Care - PPO | Attending: Cardiology | Admitting: Cardiology

## 2022-12-12 ENCOUNTER — Encounter: Payer: Self-pay | Admitting: Cardiology

## 2022-12-12 VITALS — Ht 73.0 in | Wt 253.0 lb

## 2022-12-12 DIAGNOSIS — R0789 Other chest pain: Secondary | ICD-10-CM | POA: Diagnosis not present

## 2022-12-12 NOTE — Patient Instructions (Signed)
Medication Instructions:  Your physician has recommended you make the following change in your medication:  - Stop Imdur   Labwork: None  Testing/Procedures: None  Follow-Up: Keep April 2024 follow up appointment with Dr. Harl Bowie.   Any Other Special Instructions Will Be Listed Below (If Applicable).     If you need a refill on your cardiac medications before your next appointment, please call your pharmacy.
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
## 2022-12-12 NOTE — Addendum Note (Signed)
Addended by: Christella Scheuermann C on: 12/12/2022 12:59 PM   Modules accepted: Orders

## 2022-12-20 NOTE — Progress Notes (Signed)
Remote pacemaker transmission.   

## 2023-03-05 ENCOUNTER — Ambulatory Visit (INDEPENDENT_AMBULATORY_CARE_PROVIDER_SITE_OTHER): Payer: Commercial Managed Care - PPO

## 2023-03-05 DIAGNOSIS — I442 Atrioventricular block, complete: Secondary | ICD-10-CM

## 2023-03-05 LAB — CUP PACEART REMOTE DEVICE CHECK
Battery Remaining Longevity: 121 mo
Battery Voltage: 2.93 V
Brady Statistic AP VP Percent: 0.04 %
Brady Statistic AP VS Percent: 0.23 %
Brady Statistic AS VP Percent: 0.03 %
Brady Statistic AS VS Percent: 99.7 %
Brady Statistic RA Percent Paced: 0.3 %
Brady Statistic RV Percent Paced: 0.07 %
Date Time Interrogation Session: 20240408003918
Implantable Lead Connection Status: 753985
Implantable Lead Connection Status: 753985
Implantable Lead Implant Date: 20190123
Implantable Lead Implant Date: 20190123
Implantable Lead Location: 753859
Implantable Lead Location: 753860
Implantable Lead Model: 5076
Implantable Lead Model: 5076
Implantable Pulse Generator Implant Date: 20190123
Lead Channel Impedance Value: 323 Ohm
Lead Channel Impedance Value: 342 Ohm
Lead Channel Impedance Value: 418 Ohm
Lead Channel Impedance Value: 418 Ohm
Lead Channel Pacing Threshold Amplitude: 0.625 V
Lead Channel Pacing Threshold Amplitude: 1.125 V
Lead Channel Pacing Threshold Pulse Width: 0.4 ms
Lead Channel Pacing Threshold Pulse Width: 0.4 ms
Lead Channel Sensing Intrinsic Amplitude: 11.375 mV
Lead Channel Sensing Intrinsic Amplitude: 11.375 mV
Lead Channel Sensing Intrinsic Amplitude: 3 mV
Lead Channel Sensing Intrinsic Amplitude: 3 mV
Lead Channel Setting Pacing Amplitude: 2 V
Lead Channel Setting Pacing Amplitude: 2.5 V
Lead Channel Setting Pacing Pulse Width: 0.4 ms
Lead Channel Setting Sensing Sensitivity: 1.2 mV
Zone Setting Status: 755011
Zone Setting Status: 755011

## 2023-03-12 ENCOUNTER — Ambulatory Visit: Payer: Commercial Managed Care - PPO | Attending: Cardiology | Admitting: Cardiology

## 2023-03-12 ENCOUNTER — Encounter: Payer: Self-pay | Admitting: Cardiology

## 2023-03-12 VITALS — BP 138/78 | HR 97 | Ht 73.0 in | Wt 265.0 lb

## 2023-03-12 DIAGNOSIS — R0789 Other chest pain: Secondary | ICD-10-CM

## 2023-03-12 DIAGNOSIS — I10 Essential (primary) hypertension: Secondary | ICD-10-CM

## 2023-03-12 DIAGNOSIS — Z950 Presence of cardiac pacemaker: Secondary | ICD-10-CM

## 2023-03-12 DIAGNOSIS — I2510 Atherosclerotic heart disease of native coronary artery without angina pectoris: Secondary | ICD-10-CM

## 2023-03-12 DIAGNOSIS — E782 Mixed hyperlipidemia: Secondary | ICD-10-CM | POA: Diagnosis not present

## 2023-03-12 DIAGNOSIS — R002 Palpitations: Secondary | ICD-10-CM
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
## 2023-03-12 NOTE — Patient Instructions (Signed)
Medication Instructions:  Your physician recommends that you continue on your current medications as directed. Please refer to the Current Medication list given to you today.  *If you need a refill on your cardiac medications before your next appointment, please call your pharmacy*   Lab Work: None If you have labs (blood work) drawn today and your tests are completely normal, you will receive your results only by: MyChart Message (if you have MyChart) OR A paper copy in the mail If you have any lab test that is abnormal or we need to change your treatment, we will call you to review the results.   Testing/Procedures: None   Follow-Up: At [facility], you and your health needs are our priority.  As part of our continuing mission to provide you with exceptional heart care, we have created designated Provider Care Teams.  These Care Teams include your primary Cardiologist (physician) and Advanced Practice Providers (APPs -  Physician Assistants and Nurse Practitioners) who all work together to provide you with the care you need, when you need it.  We recommend signing up for the patient portal called "MyChart".  Sign up information is provided on this After Visit Summary.  MyChart is used to connect with patients for Virtual Visits (Telemedicine).  Patients are able to view lab/test results, encounter notes, upcoming appointments, etc.  Non-urgent messages can be sent to your provider as well.   To learn more about what you can do with MyChart, go to [email or web address].    Your next appointment:   1-2 Weeks Nurse Visit- BP check- please bring your blood pressure cuff with you to your appointment.   6 month follow up with Dr. Wyline Mood   Other Instructions

## 2023-03-30 ENCOUNTER — Encounter: Payer: Self-pay | Admitting: Cardiology

## 2023-03-30 ENCOUNTER — Ambulatory Visit: Payer: Commercial Managed Care - PPO | Attending: Cardiology

## 2023-03-30 VITALS — BP 144/92 | HR 84 | Ht 73.0 in | Wt 265.0 lb

## 2023-03-30 DIAGNOSIS — I10 Essential (primary) hypertension: Secondary | ICD-10-CM | POA: Diagnosis not present

## 2023-03-30 NOTE — Progress Notes (Signed)
Patient presents today for BP check.  Patient presented bp log- will have it scanned to provider.  Patient brought bp cuff to be compared to [facility] reading.   Patient stated that he believes that his bp is higher when he is stressed out- pt is currently dealing with issues surrounding work and closing of his parents estate. Pt stated he can usually tell that his bp is higher due to his face feeling hot/turning red.   Pt stated that he takes his medication early in the morning and checks his bp right before bed at night. Advise patient to take bp 2-3 hours after taking medication after sitting quietly for at least 5 minutes with fee flat on the floor, ankles uncrossed.   Omron cuff- 146/91 hr 85  Pt stated that he had stopped by [employer] this afternoon and had an espresso as he forgot he had a nursing appointment this afternoon.

## 2023-04-03 NOTE — Progress Notes (Signed)
Home numbers overall look ok. Looks like his home cuff did test similar to ours. I would stick with current medication regimen. Normal for bp to vary to some degree particularly with stress. If home bp's consisttenly 140s or higher on top number let us know  Dominga Ferry MD

## 2023-04-09 NOTE — Progress Notes (Signed)
Remote pacemaker transmission.   

## 2023-06-04 ENCOUNTER — Ambulatory Visit: Payer: Commercial Managed Care - PPO

## 2023-06-04 DIAGNOSIS — I442 Atrioventricular block, complete: Secondary | ICD-10-CM | POA: Diagnosis not present

## 2023-06-04 LAB — CUP PACEART REMOTE DEVICE CHECK
Battery Remaining Longevity: 118 mo
Battery Voltage: 2.93 V
Brady Statistic AP VP Percent: 0.99 %
Brady Statistic AP VS Percent: 1.29 %
Brady Statistic AS VP Percent: 0.03 %
Brady Statistic AS VS Percent: 97.69 %
Brady Statistic RA Percent Paced: 2.3 %
Brady Statistic RV Percent Paced: 1.02 %
Date Time Interrogation Session: 20240708004020
Implantable Lead Connection Status: 753985
Implantable Lead Connection Status: 753985
Implantable Lead Implant Date: 20190123
Implantable Lead Implant Date: 20190123
Implantable Lead Location: 753859
Implantable Lead Location: 753860
Implantable Lead Model: 5076
Implantable Lead Model: 5076
Implantable Pulse Generator Implant Date: 20190123
Lead Channel Impedance Value: 323 Ohm
Lead Channel Impedance Value: 342 Ohm
Lead Channel Impedance Value: 399 Ohm
Lead Channel Impedance Value: 418 Ohm
Lead Channel Pacing Threshold Amplitude: 0.875 V
Lead Channel Pacing Threshold Amplitude: 1 V
Lead Channel Pacing Threshold Pulse Width: 0.4 ms
Lead Channel Pacing Threshold Pulse Width: 0.4 ms
Lead Channel Sensing Intrinsic Amplitude: 11.125 mV
Lead Channel Sensing Intrinsic Amplitude: 11.125 mV
Lead Channel Sensing Intrinsic Amplitude: 2.75 mV
Lead Channel Sensing Intrinsic Amplitude: 2.75 mV
Lead Channel Setting Pacing Amplitude: 2 V
Lead Channel Setting Pacing Amplitude: 2.5 V
Lead Channel Setting Pacing Pulse Width: 0.4 ms
Lead Channel Setting Sensing Sensitivity: 1.2 mV
Zone Setting Status: 755011
Zone Setting Status: 755011

## 2023-06-13 ENCOUNTER — Encounter: Payer: Self-pay | Admitting: Cardiology

## 2023-06-14 ENCOUNTER — Encounter: Payer: Self-pay | Admitting: Cardiology

## 2023-06-15 NOTE — Telephone Encounter (Signed)
I would agree home sleep studies are good tests and I see them used often to diagnose sleep apnea even by sleep medicine specialists. I agree he needs to start cpap. PCP's have different comfort levels in managing sleep apnea, I would defer to Dr Sherwood Gambler to discuss there office's usual protocol. If is is something they have trouble managing then yes there is a Dr Craige Cotta and Dr Vassie Loll with [facility] in [place] that are available, he is correct that Dr Sherene Sires there partner does not do sleep medicine.   I would start the cpap and discuss with Dr Sherwood Gambler there office's normal protcol for managing sleep apnea patients.   Dominga Ferry MD

## 2023-06-19 NOTE — Progress Notes (Signed)
Remote pacemaker transmission.   

## 2023-07-18 ENCOUNTER — Encounter: Payer: Self-pay | Admitting: Cardiology

## 2023-07-19 ENCOUNTER — Encounter: Payer: Self-pay | Admitting: Internal Medicine

## 2023-07-20 ENCOUNTER — Encounter: Payer: Self-pay | Admitting: Cardiology

## 2023-07-20 ENCOUNTER — Ambulatory Visit: Payer: Commercial Managed Care - PPO | Admitting: Cardiology

## 2023-07-20 VITALS — BP 132/82 | HR 80 | Ht 73.0 in | Wt 270.0 lb

## 2023-07-20 DIAGNOSIS — I442 Atrioventricular block, complete: Secondary | ICD-10-CM | POA: Diagnosis not present

## 2023-07-20 DIAGNOSIS — R079 Chest pain, unspecified: Secondary | ICD-10-CM | POA: Diagnosis not present

## 2023-07-20 NOTE — Patient Instructions (Signed)
Medication Instructions:  Your physician recommends that you continue on your current medications as directed. Please refer to the Current Medication list given to you today.]  *If you need a refill on your cardiac medications before your next appointment, please call your pharmacy*   Lab Work: None If you have labs (blood work) drawn today and your tests are completely normal, you will receive your results only by: MyChart Message (if you have MyChart) OR A paper copy in the mail If you have any lab test that is abnormal or we need to change your treatment, we will call you to review the results.   Testing/Procedures: Your physician has requested that you have a lexiscan myoview. For further information please visit [email or web address] Please follow instruction sheet, as given.    Follow-Up: At [facility], you and your health needs are our priority.  As part of our continuing mission to provide you with exceptional heart care, we have created designated Provider Care Teams.  These Care Teams include your primary Cardiologist (physician) and Advanced Practice Providers (APPs -  Physician Assistants and Nurse Practitioners) who all work together to provide you with the care you need, when you need it.  We recommend signing up for the patient portal called "MyChart".  Sign up information is provided on this After Visit Summary.  MyChart is used to connect with patients for Virtual Visits (Telemedicine).  Patients are able to view lab/test results, encounter notes, upcoming appointments, etc.  Non-urgent messages can be sent to your provider as well.   To learn more about what you can do with MyChart, go to [email or web address].    Your next appointment:    Keep your scheduled follow up   Provider:   Randall An, PA-C    Other Instructions
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
## 2023-07-23 ENCOUNTER — Telehealth: Payer: Self-pay

## 2023-07-23 NOTE — Telephone Encounter (Signed)
The pt had some work around his pacemaker. He wants to make sure his ppm is working fine. I asked him to send a transmission when he get home.

## 2023-07-23 NOTE — Telephone Encounter (Signed)
Remote transmission received and reviewed. No events. Patient appreciative of call back.

## 2023-07-23 NOTE — Telephone Encounter (Signed)
Transmission received. The patient would like for the nurse to call him with the results.

## 2023-07-26 ENCOUNTER — Telehealth (HOSPITAL_COMMUNITY): Payer: Self-pay | Admitting: Emergency Medicine

## 2023-07-27 ENCOUNTER — Encounter (HOSPITAL_COMMUNITY): Payer: Self-pay

## 2023-07-27 ENCOUNTER — Encounter (HOSPITAL_BASED_OUTPATIENT_CLINIC_OR_DEPARTMENT_OTHER): Admission: RE | Admit: 2023-07-27 | Payer: Commercial Managed Care - PPO | Source: Ambulatory Visit

## 2023-07-27 ENCOUNTER — Ambulatory Visit (HOSPITAL_COMMUNITY)
Admission: RE | Admit: 2023-07-27 | Discharge: 2023-07-27 | Disposition: A | Discharge status: Home / Self Care | Payer: Commercial Managed Care - PPO | Source: Ambulatory Visit | Attending: Cardiology | Admitting: Cardiology

## 2023-07-27 DIAGNOSIS — R079 Chest pain, unspecified: Secondary | ICD-10-CM

## 2023-07-27 LAB — NM MYOCAR MULTI W/SPECT W/WALL MOTION / EF
LV dias vol: 100 mL (ref 62–150)
LV sys vol: 35 mL
Nuc Stress EF: 65 %
Peak HR: 85 {beats}/min
RATE: 0.4
Rest HR: 66 {beats}/min
Rest Nuclear Isotope Dose: 10.5 mCi
SDS: 1
SRS: 0
SSS: 1
ST Depression (mm): 0 mm
Stress Nuclear Isotope Dose: 31 mCi
TID: 1.22

## 2023-07-27 MED ORDER — TECHNETIUM TC 99M TETROFOSMIN IV KIT
10.0000 | PACK | Freq: Once | INTRAVENOUS | Status: AC | PRN
  Administered 2023-07-27: 10.5 via INTRAVENOUS

## 2023-07-27 MED ORDER — REGADENOSON 0.4 MG/5ML IV SOLN
INTRAVENOUS | Status: AC
  Administered 2023-07-27: 0.4 mg via INTRAVENOUS
  Filled 2023-07-27: qty 5

## 2023-07-27 MED ORDER — SODIUM CHLORIDE FLUSH 0.9 % IV SOLN
INTRAVENOUS | Status: AC
  Administered 2023-07-27: 10 mL via INTRAVENOUS
  Filled 2023-07-27: qty 10

## 2023-07-27 MED ORDER — TECHNETIUM TC 99M TETROFOSMIN IV KIT
30.0000 | PACK | Freq: Once | INTRAVENOUS | Status: AC | PRN
  Administered 2023-07-27: 31 via INTRAVENOUS

## 2023-07-31 ENCOUNTER — Encounter: Payer: Self-pay | Admitting: Gastroenterology

## 2023-07-31 ENCOUNTER — Ambulatory Visit (INDEPENDENT_AMBULATORY_CARE_PROVIDER_SITE_OTHER): Payer: Commercial Managed Care - PPO | Admitting: Gastroenterology

## 2023-07-31 ENCOUNTER — Other Ambulatory Visit: Payer: Self-pay | Admitting: Gastroenterology

## 2023-07-31 VITALS — BP 127/83 | HR 86 | Temp 97.5°F | Ht 73.5 in | Wt 264.2 lb

## 2023-07-31 DIAGNOSIS — R109 Unspecified abdominal pain: Secondary | ICD-10-CM | POA: Insufficient documentation

## 2023-07-31 DIAGNOSIS — R1084 Generalized abdominal pain: Secondary | ICD-10-CM

## 2023-07-31 DIAGNOSIS — R110 Nausea: Secondary | ICD-10-CM | POA: Diagnosis not present

## 2023-07-31 DIAGNOSIS — R195 Other fecal abnormalities: Secondary | ICD-10-CM | POA: Diagnosis not present

## 2023-07-31 DIAGNOSIS — K219 Gastro-esophageal reflux disease without esophagitis: Secondary | ICD-10-CM | POA: Diagnosis not present

## 2023-07-31 MED ORDER — DICYCLOMINE HCL 10 MG PO CAPS: ORAL_CAPSULE | ORAL | 0 refills | Status: DC

## 2023-07-31 MED ORDER — PANTOPRAZOLE SODIUM 40 MG PO TBEC: 40.0000 mg | DELAYED_RELEASE_TABLET | Freq: Two times a day (BID) | ORAL | 5 refills | Status: DC

## 2023-07-31 NOTE — Patient Instructions (Signed)
Increase pantoprazole to twice daily 30 minutes before breakfast time and 30 minutes before supper. Trial of dicyclomine 10mg  up to four times daily as needed for abdominal discomfort and loose stools.  Complete labs, we will be in touch with results as available. Return office visit with me in two weeks.

## 2023-07-31 NOTE — Progress Notes (Signed)
GI Office Note    Referring Provider: Assunta Found, MD Primary Care Physician:  Elfredia Nevins, MD  Primary Gastroenterologist: Roetta Sessions, MD   Chief Complaint   Chief Complaint  Patient presents with   Nausea    Been having issues with nausea, abdominal pain, and loose stools for the past couple of weeks.     History of Present Illness   Bill Castro is a 62 y.o. male presenting today for same-day office visit.  Last seen in August 2023.  History of GERD, constipation, fatty liver, abdominal pain.  At time of last office visit he was seen for abdominal pain in the right upper quadrant associated with diarrhea, indigestion.  Somewhat postprandial symptoms.  CT abdomen pelvis reassuring.  Completed gallbladder workup with abdominal ultrasound August 2023, gallbladder unremarkable.  HIDA unremarkable as well. CTA chest/abdomen/pelvis January 2024 unremarkable.  Has been having a lot of back issues, thoracic region. Followed by Dr. Danielle Dess. Saw Dr. Sherwood Gambler recently for pain. Received shot of decadron or toradol per patient. Given vicodin 10/325mg . He had been taking one at end of day. Two weeks ago, noted epigastric pain/burning after taking Vicodin, usually about one hour later. Feeling nauseated. Started having loose stools. Typically at baseline has constipation. Stomach makes loud gugrling. No vomiting. Has been taking mylanta and sometimes alternating with pepto. He restarted pantoprazole. Stools black on pepto. Complains of feeling full, loss of appetite. Trying to eat lighter. No brbpr.   Stress test recently normal.  Colonoscopy February 2022: -Diverticulosis -Repeat colonoscopy in 10 years  EGD February 2022: -Normal esophagus status post dilation -Erosive gastropathy status post biopsy (mild chronic gastritis, no H. pylori).   Medications   Current Outpatient Medications  Medication Sig Dispense Refill   acetaminophen (TYLENOL) 500 MG tablet Take 1,000 mg by  mouth every 6 (six) hours as needed (back pain).      ALPRAZolam (XANAX) 1 MG tablet Take 1 mg by mouth 2 (two) times daily as needed. Usually takes 1 tablet at bedtime  2   aspirin EC 81 MG tablet Take 81 mg by mouth daily with lunch.      atorvastatin (LIPITOR) 20 MG tablet TAKE 1 TABLET(20 MG) BY MOUTH DAILY 90 tablet 3   cholecalciferol (VITAMIN D3) 25 MCG (1000 UNIT) tablet Take 1,000 Units by mouth daily.     cyclobenzaprine (FLEXERIL) 10 MG tablet Take 10 mg by mouth 3 (three) times daily.     levothyroxine (SYNTHROID) 100 MCG tablet Take 100 mcg by mouth daily before breakfast.     lisinopril (ZESTRIL) 5 MG tablet TAKE 1 TABLET(5 MG) BY MOUTH DAILY 90 tablet 3   metoprolol succinate (TOPROL-XL) 50 MG 24 hr tablet TAKE 1 AND 1/2 TABLETS(75 MG) BY MOUTH DAILY WITH OR IMMEDIATELY FOLLOWING A MEAL 135 tablet 3   Omega-3 Fatty Acids (FISH OIL) 1200 MG CAPS Take 1,200 mg by mouth daily.     pantoprazole (PROTONIX) 40 MG tablet Take 40 mg by mouth as needed.     nitroGLYCERIN (NITROSTAT) 0.4 MG SL tablet Place 1 tablet (0.4 mg total) under the tongue every 5 (five) minutes as needed for chest pain. (Patient not taking: Reported on 07/20/2023) 25 tablet 3   No current facility-administered medications for this visit.    Allergies   Allergies as of 07/31/2023 - Review Complete 07/31/2023  Allergen Reaction Noted   Erythromycin Hives and Swelling 01/19/2012   Penicillins Other (See Comments) 01/19/2012  Review of Systems   General: Negative for weight loss, fever, chills, fatigue, weakness. See hpi ENT: Negative for hoarseness, difficulty swallowing , nasal congestion. CV: Negative for chest pain, angina, palpitations, dyspnea on exertion, peripheral edema.  Respiratory: Negative for dyspnea at rest, dyspnea on exertion, cough, sputum, wheezing.  GI: See history of present illness. GU:  Negative for dysuria, hematuria, urinary incontinence, urinary frequency, nocturnal urination.   Endo: Negative for unusual weight change.     Physical Exam   BP 127/83 (BP Location: Right Arm, Patient Position: Sitting, Cuff Size: Large)   Pulse 86   Temp (!) 97.5 F (36.4 C) (Oral)   Ht 6' 1.5" (1.867 m)   Wt 264 lb 3.2 oz (119.8 kg)   SpO2 97%   BMI 34.38 kg/m    General: Well-nourished, well-developed in no acute distress.  Eyes: No icterus. Mouth: Oropharyngeal mucosa moist and pink  Lungs: Clear to auscultation bilaterally.  Heart: Regular rate and rhythm, no murmurs rubs or gallops.  Abdomen: Bowel sounds are normal, nontender, nondistended, no hepatosplenomegaly or masses,  no abdominal bruits or hernia , no rebound or guarding.  Rectal: not performed Extremities: No lower extremity edema. No clubbing or deformities. Neuro: Alert and oriented x 4   Skin: Warm and dry, no jaundice.   Psych: Alert and cooperative, normal mood and affect.  Labs   Lab Results  Component Value Date   NA 138 12/02/2022   CL 107 12/02/2022   K 4.2 12/02/2022   CO2 24 12/02/2022   BUN 10 12/02/2022   CREATININE 1.05 12/02/2022   GFRNONAA >60 12/02/2022   CALCIUM 8.4 (L) 12/02/2022   ALBUMIN 4.8 06/26/2022   GLUCOSE 97 12/02/2022   Lab Results  Component Value Date   ALT 19 06/26/2022   AST 17 06/26/2022   ALKPHOS 54 06/26/2022   BILITOT 1.0 06/26/2022   Lab Results  Component Value Date   WBC 8.3 12/02/2022   HGB 14.6 12/02/2022   HCT 43.7 12/02/2022   MCV 98.6 12/02/2022   PLT 278 12/02/2022    Imaging Studies   NM Myocar Multi W/Spect W/Wall Motion / EF  Result Date: 07/27/2023   Findings are consistent with no ischemia. The study is low risk.   No ST deviation was noted. The ECG was negative for ischemia.   LV perfusion is equivocal.  Small, moderate intensity, mid to apical inferior defect that is fixed and most likely indicative of diaphragmatic attenuation rather than scar.  No significant anterior ischemia.   Left ventricular function is normal. Nuclear  stress EF: 65%. Low risk study with suspected diaphragmatic attenuation resulting in fixed inferior wall defect, no anterior distribution ischemia.  LVEF 65%.    Assessment   *GERD *Abdominal pain *Loose stools *Nausea  Recent onset upper GI symptoms with epigastric pain/burning, nausea in setting of ongoing back pain, use of narcotics. ?worsening of GERD type symptoms off PPI with likely delayed gastric emptying on opioids. Cannot rule out gastritis/PUD. He also noted change from baseline constipation to loose stools. No recent antibiotic use. ?baseline IBS with flare of symptoms in setting of anxiety. No specific food triggers noted. We will obtain labs to screen for celiac, IBD, infection/inflammation. Check TSH. CTA Chest/abd/pelvis in 11/2022 reassuring from GI standpoint. EGD/colonoscopy in 2022 as outlined. Gallbladder work up negative last year.   PLAN   Increase pantoprazole to 40mg  BID. Trial of bentyl 10mg  up to qid prn. Complete labs.  Return ov in 2 weeks.  Leanna Battles. Melvyn Neth, MHS, PA-C [facility]

## 2023-08-02 LAB — CBC WITH DIFFERENTIAL/PLATELET
Basophils Absolute: 0.1 10*3/uL (ref 0.0–0.2)
Basos: 1 %
EOS (ABSOLUTE): 0.3 10*3/uL (ref 0.0–0.4)
Eos: 4 %
Hematocrit: 43.8 % (ref 37.5–51.0)
Hemoglobin: 15 g/dL (ref 13.0–17.7)
Immature Grans (Abs): 0 10*3/uL (ref 0.0–0.1)
Immature Granulocytes: 0 %
Lymphocytes Absolute: 2.4 10*3/uL (ref 0.7–3.1)
Lymphs: 32 %
MCH: 32.5 pg (ref 26.6–33.0)
MCHC: 34.2 g/dL (ref 31.5–35.7)
MCV: 95 fL (ref 79–97)
Monocytes Absolute: 0.6 10*3/uL (ref 0.1–0.9)
Monocytes: 8 %
Neutrophils Absolute: 4.1 10*3/uL (ref 1.4–7.0)
Neutrophils: 55 %
Platelets: 286 10*3/uL (ref 150–450)
RBC: 4.61 x10E6/uL (ref 4.14–5.80)
RDW: 12.5 % (ref 11.6–15.4)
WBC: 7.5 10*3/uL (ref 3.4–10.8)

## 2023-08-02 LAB — COMPREHENSIVE METABOLIC PANEL
ALT: 26 IU/L (ref 0–44)
AST: 27 IU/L (ref 0–40)
Albumin: 4.3 g/dL (ref 3.9–4.9)
Alkaline Phosphatase: 65 IU/L (ref 44–121)
BUN/Creatinine Ratio: 5 — ABNORMAL LOW (ref 10–24)
BUN: 5 mg/dL — ABNORMAL LOW (ref 8–27)
Bilirubin Total: 0.4 mg/dL (ref 0.0–1.2)
CO2: 20 mmol/L (ref 20–29)
Calcium: 9.5 mg/dL (ref 8.6–10.2)
Chloride: 104 mmol/L (ref 96–106)
Creatinine, Ser: 1.06 mg/dL (ref 0.76–1.27)
Globulin, Total: 2.2 g/dL (ref 1.5–4.5)
Glucose: 103 mg/dL — ABNORMAL HIGH (ref 70–99)
Potassium: 4.7 mmol/L (ref 3.5–5.2)
Sodium: 143 mmol/L (ref 134–144)
Total Protein: 6.5 g/dL (ref 6.0–8.5)
eGFR: 80 mL/min/{1.73_m2} (ref >59)

## 2023-08-02 LAB — IGA: IgA/Immunoglobulin A, Serum: 170 mg/dL (ref 61–437)

## 2023-08-02 LAB — LIPASE: Lipase: 31 U/L (ref 13–78)

## 2023-08-02 LAB — SEDIMENTATION RATE: Sed Rate: 8 mm/h (ref 0–30)

## 2023-08-02 LAB — TISSUE TRANSGLUTAMINASE, IGA: Transglutaminase IgA: <2 U/mL (ref 0–3)

## 2023-08-02 LAB — C-REACTIVE PROTEIN: CRP: <1 mg/L (ref 0–10)

## 2023-08-02 LAB — TSH: TSH: 0.905 u[IU]/mL (ref 0.450–4.500)

## 2023-08-04 ENCOUNTER — Encounter: Payer: Self-pay | Admitting: Gastroenterology

## 2023-08-14 ENCOUNTER — Ambulatory Visit (INDEPENDENT_AMBULATORY_CARE_PROVIDER_SITE_OTHER): Payer: Commercial Managed Care - PPO | Admitting: Gastroenterology

## 2023-08-14 ENCOUNTER — Encounter: Payer: Self-pay | Admitting: Gastroenterology

## 2023-08-14 VITALS — BP 125/77 | HR 85 | Temp 98.5°F | Ht 73.5 in | Wt 260.2 lb

## 2023-08-14 DIAGNOSIS — K6289 Other specified diseases of anus and rectum: Secondary | ICD-10-CM

## 2023-08-14 DIAGNOSIS — R109 Unspecified abdominal pain: Secondary | ICD-10-CM | POA: Diagnosis not present

## 2023-08-14 DIAGNOSIS — K219 Gastro-esophageal reflux disease without esophagitis: Secondary | ICD-10-CM | POA: Diagnosis not present
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
## 2023-08-14 NOTE — Patient Instructions (Signed)
I will discuss possible anoscope with Lewie Loron, NP here at our office and request records from your dermatologist. Further recommendations to follow.  Continue pantoprazole 40mg  daily. Let me know if your abdominal pressure worsens or persists.

## 2023-08-15 DIAGNOSIS — K6289 Other specified diseases of anus and rectum: Secondary | ICD-10-CM | POA: Insufficient documentation

## 2023-08-31 ENCOUNTER — Other Ambulatory Visit: Payer: Self-pay | Admitting: Cardiology

## 2023-09-11 ENCOUNTER — Ambulatory Visit: Payer: Commercial Managed Care - PPO | Attending: Internal Medicine | Admitting: Internal Medicine

## 2023-09-11 ENCOUNTER — Encounter: Payer: Self-pay | Admitting: Internal Medicine

## 2023-09-11 VITALS — BP 124/86 | HR 78 | Ht 73.5 in | Wt 259.4 lb

## 2023-09-11 DIAGNOSIS — I442 Atrioventricular block, complete: Secondary | ICD-10-CM

## 2023-09-11 NOTE — Patient Instructions (Signed)
Medication Instructions:  Your physician recommends that you continue on your current medications as directed. Please refer to the Current Medication list given to you today.  *If you need a refill on your cardiac medications before your next appointment, please call your pharmacy*   Lab Work: NONE   If you have labs (blood work) drawn today and your tests are completely normal, you will receive your results only by: MyChart Message (if you have MyChart) OR A paper copy in the mail If you have any lab test that is abnormal or we need to change your treatment, we will call you to review the results.   Testing/Procedures: NONE    Follow-Up: At [facility], you and your health needs are our priority.  As part of our continuing mission to provide you with exceptional heart care, we have created designated Provider Care Teams.  These Care Teams include your primary Cardiologist (physician) and Advanced Practice Providers (APPs -  Physician Assistants and Nurse Practitioners) who all work together to provide you with the care you need, when you need it.  We recommend signing up for the patient portal called "MyChart".  Sign up information is provided on this After Visit Summary.  MyChart is used to connect with patients for Virtual Visits (Telemedicine).  Patients are able to view lab/test results, encounter notes, upcoming appointments, etc.  Non-urgent messages can be sent to your provider as well.   To learn more about what you can do with MyChart, go to [email or web address].    Your next appointment:   1 year(s)  Provider:   Gregg Taylor, MD    Other Instructions Thank you for choosing [facility]!    
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
## 2023-09-12 LAB — CUP PACEART INCLINIC DEVICE CHECK
Date Time Interrogation Session: 20241015221947
Eval Rhythm: NORMAL
Implantable Lead Connection Status: 753985
Implantable Lead Connection Status: 753985
Implantable Lead Implant Date: 20190123
Implantable Lead Implant Date: 20190123
Implantable Lead Location: 753859
Implantable Lead Location: 753860
Implantable Lead Model: 5076
Implantable Lead Model: 5076
Implantable Pulse Generator Implant Date: 20190123

## 2023-09-13 ENCOUNTER — Ambulatory Visit: Payer: Commercial Managed Care - PPO | Admitting: Gastroenterology

## 2023-09-13 ENCOUNTER — Encounter: Payer: Self-pay | Admitting: Gastroenterology

## 2023-09-13 VITALS — BP 108/76 | HR 97 | Temp 98.7°F | Ht 73.5 in | Wt 259.8 lb

## 2023-09-13 DIAGNOSIS — K641 Second degree hemorrhoids: Secondary | ICD-10-CM

## 2023-09-13 NOTE — Progress Notes (Signed)
      [facility] BANDING PROCEDURE NOTE  Bill Castro is a 62 y.o. male presenting today for consideration of hemorrhoid banding. Last colonoscopy In Feb 2022 with diverticulosis. Constipated a few months ago, had hard stool and bleeding. No bleeding now. No recent prolapsing but had this in the past. Some itching and burning with improvements using OTC suppositories. Being treated for genital condyloma. Dermatology wanted anoscopy completed. He is curious about hemorrhoid banding. I did discuss with him he may need more extensive evaluation with high-resolution anoscopy. Regarding hemorrhoids, we could band today if able.    The patient presents with symptomatic grade 2 hemorrhoids, unresponsive to maximal medical therapy, requesting rubber band ligation of his hemorrhoidal disease. All risks, benefits, and alternative forms of therapy were described and informed consent was obtained.  In the left lateral decubitus position, anoscopic examination revealed grade 2 hemorrhoids in all columns. From this limited view, possible very small condylomas below dentate line just before anal verge.   The decision was made to band the left lateral internal hemorrhoid, and the [facility] O'Regan System was used to perform band ligation without complication. Digital anorectal examination was then performed to assure proper positioning of the band, and to adjust the banded tissue as required. The patient was discharged home without pain or other issues. Dietary and behavioral recommendations were given, along with follow-up instructions. The patient will return in several weeks for followup and possible additional banding as required. We will also refer him to colorectal surgeon for high resolution anoscopy and treatment as appropriate.   No complications were encountered and the patient tolerated the procedure well.   Gelene Mink, PhD, ANP-BC [facility]

## 2023-09-13 NOTE — Patient Instructions (Signed)
  Please avoid straining.  You should limit your toilet time to 2-3 minutes at the most.   I recommend Benefiber 2 teaspoons each morning in the beverage of your choice!  Please call me with any concerns or issues!  I will see you in follow-up for additional banding in several weeks.  I will be in touch on MyChart about further steps!  It was a pleasure to see you today. I want to create trusting relationships with patients and provide genuine, compassionate, and quality care. I truly value your feedback, so please be on the lookout for a survey regarding your visit with me today. I appreciate your time in completing this!         Gelene Mink, PhD, ANP-BC [facility]

## 2023-09-14 MED ORDER — HYDROCORTISONE (PERIANAL) 2.5 % EX CREA: 1.0000 | TOPICAL_CREAM | Freq: Two times a day (BID) | CUTANEOUS | 1 refills | Status: DC

## 2023-09-14 NOTE — Telephone Encounter (Signed)
noted 

## 2023-09-16 ENCOUNTER — Other Ambulatory Visit: Payer: Self-pay | Admitting: Cardiology

## 2023-09-21 ENCOUNTER — Encounter: Payer: Self-pay | Admitting: Student

## 2023-09-21 ENCOUNTER — Ambulatory Visit: Payer: Commercial Managed Care - PPO | Attending: Cardiology | Admitting: Student

## 2023-09-21 VITALS — BP 120/82 | HR 83 | Ht 73.5 in | Wt 259.2 lb

## 2023-09-21 DIAGNOSIS — E785 Hyperlipidemia, unspecified: Secondary | ICD-10-CM | POA: Diagnosis not present

## 2023-09-21 DIAGNOSIS — I442 Atrioventricular block, complete: Secondary | ICD-10-CM

## 2023-09-21 DIAGNOSIS — Z131 Encounter for screening for diabetes mellitus: Secondary | ICD-10-CM

## 2023-09-21 DIAGNOSIS — G4733 Obstructive sleep apnea (adult) (pediatric): Secondary | ICD-10-CM

## 2023-09-21 DIAGNOSIS — I25118 Atherosclerotic heart disease of native coronary artery with other forms of angina pectoris: Secondary | ICD-10-CM

## 2023-09-21 DIAGNOSIS — I10 Essential (primary) hypertension: Secondary | ICD-10-CM

## 2023-09-21 NOTE — Progress Notes (Signed)
Cardiology Office Note    Date:  09/21/2023  ID:  Bill Castro, DOB January 20, 1961, MRN [id number] Cardiologist: Dina Rich, MD   EP: Dr. Ladona Ridgel  History of Present Illness:    Bill Castro is a 62 y.o. 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  He was examined by Dr. Wyline Mood in 06/2023 and had been intolerant to Imdur given headaches and reported having episodes of chest pain and fullness which could occur at rest or with activity. A Lexiscan Myoview was recommended for further assessment. This was performed later that month and showed no evidence of ischemia and was overall a low-risk study. He did see Dr. Ladona Ridgel on 09/11/2023 for follow-up of his PPM and was overall doing well and his device was working normally at that time.   In talking with the patient today, he reports still having frequent back pain and is being followed by Neurosurgery but is considering seeking a second opinion. Reports his blood pressure was elevated the time of his office visit earlier this week but it was not rechecked. Blood pressure was also elevated yesterday but this was in the setting of his dog going to the veterinarian clinic and having a seizure. Reports his back pain sometimes radiates into his pectoral region bilaterally and can occur at rest or with activity. He does take muscle relaxers with improvement. Has not had to utilize sublingual nitroglycerin. Breathing has been stable with no specific dyspnea on exertion, orthopnea, PND or pitting edema. Reports occasional palpitations. He does have a CPAP and tries to use  this on a nightly basis but sometimes removes it during the night.  Studies Reviewed:   EKG: EKG is not ordered today.  NST: 06/2023   Findings are consistent with no ischemia. The study is low risk.   No ST deviation was noted. The ECG was negative for ischemia.   LV perfusion is equivocal.  Small, moderate intensity, mid to apical inferior defect that is fixed and most likely indicative of diaphragmatic attenuation rather than scar.  No significant anterior ischemia.   Left ventricular function is normal. Nuclear stress EF: 65%.   Low risk study with suspected diaphragmatic attenuation resulting in fixed inferior wall defect, no anterior distribution ischemia.  LVEF 65%.   Physical Exam:   VS:  BP 120/82   Pulse 83   Ht 6' 1.5" (1.867 m)   Wt 259 lb 3.2 oz (117.6 kg)   SpO2 98%   BMI 33.73 kg/m    Wt Readings from Last 3 Encounters:  09/21/23 259 lb 3.2 oz (117.6 kg)  09/13/23 259 lb 12.8 oz (117.8 kg)  09/11/23 259 lb 6.4 oz (117.7 kg)     GEN: Pleasant male appearing in no acute distress NECK: No JVD; No carotid bruits CARDIAC: RRR, no murmurs, rubs, gallops RESPIRATORY:  Clear to auscultation without rales, wheezing or rhonchi  ABDOMEN: Appears non-distended. No obvious abdominal masses. EXTREMITIES: No clubbing or cyanosis. No pitting edema.  Distal pedal pulses are 2+ bilaterally.   Assessment and Plan:   1. Chest Pain/CAD - He has known CAD with prior catheterization 09/2019 showing  two-vessel disease as outlined above. Recent NST in 06/2023 showed no evidence of ischemia. - We reviewed warning signs to monitor for which would be more concerning for a cardiac etiology. At this time, his biggest issue is thoracic back pain and he is being followed by Neurosurgery. - Continue current medical therapy with ASA 81 mg daily, Atorvastatin 20 mg daily and Toprol-XL 75 mg daily. Will provide an updated Rx for SL NTG. Previously intolerant to Imdur due to headaches.   2.  Complete Heart Block  - He is s/p PPM placement in 11/2017 which is followed by Dr. Ladona Ridgel. This was functioning normally by device interrogation earlier this month.   3. HTN - His blood pressure is well-controlled at 120/82 during today's visit has been elevated when checked earlier this week. We reviewed that stress and/or pain could be contributing to this. He was provided with a blood pressure log and encouraged to let us know what his readings look like over the next several weeks. If BP remains above goal, can further titrate Lisinopril from 5 mg daily to 10 mg daily.  For now, continue current medical therapy with Lisinopril 5 mg daily and Toprol-XL 75 mg daily.  4. HLD - LDL was at 75 in 2023 and I am unable to locate a repeat FLP in the interim. LFT's WNL in 07/2023. Continue Atorvastatin 20 mg daily for now. We reviewed the importance of dietary modifications as well. If LDL remains above goal, could attempt to titrate Atorvastatin to 40 mg daily (did have muscle aches with this in the past upon initiation). Adding Zetia 10 mg daily would also be an option.  5. Screening for Type 2 DM - No recent Hgb A1c on file and he is concerned if this is elevated as he previously had prediabetes. Will recheck with upcoming labs.   6. OSA - Compliance with CPAP encouraged. We reviewed this could be playing a role in his variable BP readings.    Signed, Ellsworth Lennox, PA-C

## 2023-09-21 NOTE — Patient Instructions (Signed)
Medication Instructions:  Your physician recommends that you continue on your current medications as directed. Please refer to the Current Medication list given to you today.   Please monitor blood pressure and return or send by MyChart in 3-4 weeks.   *If you need a refill on your cardiac medications before your next appointment, please call your pharmacy*   Lab Work: Your physician recommends that you return for lab work in: 1 Month ( Lipid, A1C)   If you have labs (blood work) drawn today and your tests are completely normal, you will receive your results only by: MyChart Message (if you have MyChart) OR A paper copy in the mail If you have any lab test that is abnormal or we need to change your treatment, we will call you to review the results.   Testing/Procedures: NONE    Follow-Up: At [facility], you and your health needs are our priority.  As part of our continuing mission to provide you with exceptional heart care, we have created designated Provider Care Teams.  These Care Teams include your primary Cardiologist (physician) and Advanced Practice Providers (APPs -  Physician Assistants and Nurse Practitioners) who all work together to provide you with the care you need, when you need it.  We recommend signing up for the patient portal called "MyChart".  Sign up information is provided on this After Visit Summary.  MyChart is used to connect with patients for Virtual Visits (Telemedicine).  Patients are able to view lab/test results, encounter notes, upcoming appointments, etc.  Non-urgent messages can be sent to your provider as well.   To learn more about what you can do with MyChart, go to [email or web address].    Your next appointment:   6 month(s)  Provider:   You may see Dina Rich, MD or one of the following Advanced Practice Providers on your designated Care Team:   Randall An, PA-C  Jacolyn Reedy, [place]     Other Instructions Thank you for  choosing [facility]!

## 2023-09-25 ENCOUNTER — Encounter: Payer: Self-pay | Admitting: Internal Medicine

## 2023-09-25 ENCOUNTER — Telehealth: Payer: Self-pay

## 2023-09-25 NOTE — Telephone Encounter (Signed)
Pt called in he went to the dentist and got some xrays and he thinks his ppm moved when they were doing the xray. Pt has sent in a transmission and would like a call back today to reassure that everything is ok

## 2023-09-25 NOTE — Telephone Encounter (Signed)
Patient states that he was at dentist and during final xray he felt a "click" and an "electrical movement" directly under his left shoulder over the pacemaker site.  Has felt fine since no other issues.   Reviewed patient's transmission.  No episodes, (except 1 (4 beats of NSVT on 10/25) patient had extra coffee that day, no concern.  Does not correlate with event today.  Impedance and Sensing trends are within normal limits.  Cannot assess pacing thresholds due to auto check feature being programmed off.  Patient only paces A/V < 0.3% of the time.   Device function is normal. If patient concerned or has any symptoms, offered to add on in [place] for device nurse to check who will be with Dr. Ladona Ridgel that day.  That is the only way I can 100% confirm thresholds and appropriate lead function.   Patient says he is doing well and currently denies needing an appt for Friday for device check.  He will call back if anything changes.  He has our contact number if any further concerns.

## 2023-09-26 ENCOUNTER — Telehealth: Payer: Self-pay | Admitting: Gastroenterology

## 2023-09-26 DIAGNOSIS — A630 Anogenital (venereal) warts: Secondary | ICD-10-CM

## 2023-09-26 NOTE — Telephone Encounter (Signed)
Oh that's right! Who would be the colorectal surgery place to refer to in that case?

## 2023-09-26 NOTE — Telephone Encounter (Signed)
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 I performed anoscopy here and am banding internal hemorrhoids, but I do not have capabilities of doing high resolution or treatment of that here. I want to make sure he is fully evaluated.

## 2023-09-27 NOTE — Telephone Encounter (Signed)
Great. Please refer there. Thanks!

## 2023-09-27 NOTE — Addendum Note (Signed)
Addended by: Armstead Peaks on: 09/27/2023 12:26 PM   Modules accepted: Orders

## 2023-09-27 NOTE — Telephone Encounter (Signed)
Referral placed.

## 2023-09-28 LAB — LIPID PANEL
Chol/HDL Ratio: 3.8 ratio (ref 0.0–5.0)
Cholesterol, Total: 126 mg/dL (ref 100–199)
HDL: 33 mg/dL — ABNORMAL LOW (ref >39)
LDL Chol Calc (NIH): 62 mg/dL (ref 0–99)
Triglycerides: 188 mg/dL — ABNORMAL HIGH (ref 0–149)
VLDL Cholesterol Cal: 31 mg/dL (ref 5–40)

## 2023-09-28 LAB — HEMOGLOBIN A1C
Est. average glucose Bld gHb Est-mCnc: 123 mg/dL
Hgb A1c MFr Bld: 5.9 % — ABNORMAL HIGH (ref 4.8–5.6)

## 2023-10-02 ENCOUNTER — Ambulatory Visit (INDEPENDENT_AMBULATORY_CARE_PROVIDER_SITE_OTHER): Payer: Commercial Managed Care - PPO | Admitting: Gastroenterology

## 2023-10-02 ENCOUNTER — Encounter: Payer: Self-pay | Admitting: Gastroenterology

## 2023-10-02 VITALS — BP 119/85 | HR 106 | Temp 97.9°F | Ht 73.5 in | Wt 258.0 lb

## 2023-10-02 DIAGNOSIS — K641 Second degree hemorrhoids: Secondary | ICD-10-CM | POA: Insufficient documentation

## 2023-10-02 NOTE — Patient Instructions (Signed)
  Please avoid straining.  You should limit your toilet time to 2-3 minutes at the most.   I recommend Benefiber 2 teaspoons each morning in the beverage of your choice!  Please call me with any concerns or issues!  We will see you in 3 months!  I enjoyed seeing you again today! I value our relationship and want to provide genuine, compassionate, and quality care. You may receive a survey regarding your visit with me, and I welcome your feedback! Thanks so much for taking the time to complete this. I look forward to seeing you again.      Gelene Mink, PhD, ANP-BC [facility]

## 2023-10-02 NOTE — Progress Notes (Signed)
    [facility] BANDING PROCEDURE NOTE  Bill Castro is a 62 y.o. male presenting today for consideration of hemorrhoid banding. Last colonoscopy Feb 2022 with diverticulosis. First banding Oct 2024. He has been referred to [facility] due to need for high resolution anoscopy to evaluate for any anal condylomas.    The patient presents with symptomatic grade 2 hemorrhoids, unresponsive to maximal medical therapy, requesting rubber band ligation of his hemorrhoidal disease. All risks, benefits, and alternative forms of therapy were described and informed consent was obtained.  In the left lateral decubitus position, anoscopic examination revealed grade 2 hemorrhoids in the right anterior and posterior position (s). 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   The decision was made to band the right anterior and posterior internal hemorrhoid, and the [facility] O'Regan System was used to perform band ligation without complication. Digital anorectal examination was then performed to assure proper positioning of the band, and to adjust the banded tissue as required. The patient was discharged home without pain or other issues. Dietary and behavioral recommendations were given, along with follow-up instructions. The patient will return in several weeks for followup and possible additional banding as required.  No complications were encountered and the patient tolerated the procedure well.   Gelene Mink, PhD, ANP-BC [facility]

## 2023-10-04 ENCOUNTER — Telehealth: Payer: Self-pay | Admitting: Gastroenterology

## 2023-10-04 NOTE — Telephone Encounter (Signed)
Patient left a message that he has been fine since his followup appoitnment until today and it feels as tho "something has popped out".  He doesn't know what to do.  He said that he left a message on Dena's voice mail but she is out today and I transferred the voice mail to [facility] since [place] was not in the office to get advice on what to do.

## 2023-10-05 ENCOUNTER — Encounter: Payer: Self-pay | Admitting: General Surgery

## 2023-10-05 ENCOUNTER — Ambulatory Visit (INDEPENDENT_AMBULATORY_CARE_PROVIDER_SITE_OTHER): Payer: Commercial Managed Care - PPO | Admitting: Internal Medicine

## 2023-10-05 ENCOUNTER — Ambulatory Visit (INDEPENDENT_AMBULATORY_CARE_PROVIDER_SITE_OTHER): Payer: Commercial Managed Care - PPO | Admitting: General Surgery

## 2023-10-05 ENCOUNTER — Encounter: Payer: Self-pay | Admitting: Internal Medicine

## 2023-10-05 ENCOUNTER — Ambulatory Visit: Payer: Self-pay

## 2023-10-05 VITALS — BP 122/85 | HR 98 | Temp 99.1°F | Ht 73.5 in | Wt 260.6 lb

## 2023-10-05 VITALS — BP 130/93 | HR 99 | Temp 98.6°F | Resp 14 | Ht 73.5 in | Wt 260.0 lb

## 2023-10-05 DIAGNOSIS — K645 Perianal venous thrombosis: Secondary | ICD-10-CM

## 2023-10-05 DIAGNOSIS — K5909 Other constipation: Secondary | ICD-10-CM

## 2023-10-05 NOTE — Patient Instructions (Signed)
Take Miralax daily to every 4 hours until BM and your metamucil. Take Tylenol and ibuprofen for pain. Expect some bleeding on the pads. If you saturated 4-5 pads an hour then go to the ED.  Use pads daily until not bleeding.  Take a sitz bath at least 4-5 a day for the next 3 days. Drink 100 ounces of water a day.    How to Take a [employer] A sitz bath is a warm water bath that may be used to care for your rectum, genital area, or the area between your rectum and genitals (perineum). In a sitz bath, the water only comes up to your hips and covers your buttocks. A sitz bath may be done in a bathtub or with a portable sitz bath that fits over the toilet. Your health care provider may recommend a sitz bath to help: Relieve pain and discomfort after delivering a baby. Relieve pain and itching from hemorrhoids or anal fissures. Relieve pain after certain surgeries. Relax muscles that are sore or tight. How to take a sitz bath Take 2-4 sitz baths a day, or as many as told by your health care provider. Bathtub sitz bath To take a sitz bath in a bathtub: Partially fill a bathtub with warm water. The water should be deep enough to cover your hips and buttocks when you are sitting in the bathtub. Follow your health care provider's instructions if you are told to put medicine in the water. Sit in the water. Open the bathtub drain a little, and leave it open during your bath. Turn on the warm water again, enough to replace the water that is draining out. Keep the water running throughout your bath. This helps keep the water at the right level and temperature. Soak in the water for 15-20 minutes, or as long as told by your health care provider. When you are done, be careful when you stand up. You may feel dizzy. After the sitz bath, pat yourself dry. Do not rub your skin to dry it.  Over-the-toilet sitz bath To take a sitz bath with an over-the-toilet basin: Follow the manufacturer's  instructions. Fill the basin with warm water. Follow your health care provider's instructions if you were told to put medicine in the water. Sit on the seat. Make sure the water covers your buttocks and perineum. Soak in the water for 15-20 minutes, or as long as told by your health care provider. After the sitz bath, pat yourself dry. Do not rub your skin to dry it. Clean and dry the basin between uses. Discard the basin if it cracks, or according to the manufacturer's instructions.  Contact a health care provider if: Your pain or itching gets worse. Stop doing sitz baths if your symptoms get worse. You have new symptoms. Stop doing sitz baths until you talk with your health care provider. Summary A sitz bath is a warm water bath in which the water only comes up to your hips and covers your buttocks. Your health care provider may recommend a sitz bath to help relieve pain and discomfort after delivering a baby, relieve pain and itching from hemorrhoids or anal fissures, relieve pain after certain surgeries, or help to relax muscles that are sore or tight. Take 2-4 sitz baths a day, or as many as told by your health care provider. Soak in the water for 15-20 minutes. Stop doing sitz baths if your symptoms get worse. This information is not intended to replace advice given to you  by your health care provider. Make sure you discuss any questions you have with your health care provider. Document Revised: 02/14/2022 Document Reviewed: 02/14/2022 Elsevier Patient Education  2024 [employer].
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
## 2023-10-05 NOTE — Telephone Encounter (Signed)
  Chief Complaint: Swelling after sx Symptoms: Swollen Hemorrhoids  Frequency: tonight Pertinent Negatives: Patient denies Bleeding increased pain Disposition: [] ED /[] Urgent Care (no appt availability in office) / [] Appointment(In office/virtual)/ []  [facility]/ [] Home Care/ [] Refused Recommended Disposition /[] [facility]/ [x]  Follow-up with PCP Additional Notes: Pt had a thrombosed Hemorrhoid which was removed today. Pt notes that Hemorrhoid is again very swollen, but not painful. No bleeding. Pt has called surgeon, but was unable to reach them. Pt will continue to monitor and if there are alarming changes pt will call back or go to ED. Pt wll continue with sitz baths. Pt will call surgeons office Monday morning.    Reason for Disposition  Mild rectal pain  Answer Assessment - Initial Assessment Questions 1. SYMPTOM:  "What's the main symptom you're concerned about?" (e.g., pain, itching, swelling, rash)     Pt has a swollen  2. ONSET: "When did the *No Answer*  start?"     *No Answer* 3. RECTAL PAIN: "Do you have any pain around your rectum?" "How bad is the pain?"  (Scale 0-10; or mild, moderate, severe)   - NONE (0): no pain   - MILD (1-3): doesn't interfere with normal activities    - MODERATE (4-7): interferes with normal activities or awakens from sleep, limping    - SEVERE (8-10): excruciating pain, unable to have a bowel movement      moderate 4. RECTAL ITCHING: "Do you have any itching in this area?" "How bad is the itching?"  (Scale 0-10; or mild, moderate, severe)   - NONE: no itching   - MILD: doesn't interfere with normal activities    - MODERATE-SEVERE: interferes with normal activities or awakens from sleep     no 5. CONSTIPATION: "Do you have constipation?" If Yes, ask: "How bad is it?"     no 6. CAUSE: "What do you think is causing the anus symptoms?"     sx 7. OTHER SYMPTOMS: "Do you have any other symptoms?"  (e.g., abdomen pain, fever,  rectal bleeding, vomiting)     Swelling  Protocols used: Rectal Symptoms-A-AH

## 2023-10-05 NOTE — Patient Instructions (Signed)
On examination today you appeared to have a thrombosed hemorrhoid.  I do not detect any residual bands.  This may or may not be related to recent banding.  I have spoken Dr. Larae Grooms  He has agreed to see you right away.  Please go to our office.  I do recommend stool softener 100 mg of Colace twice daily and liberal use of MiraLAX to prevent constipation.  Further recommendations to follow.

## 2023-10-22 ENCOUNTER — Telehealth: Payer: Self-pay

## 2023-10-22 DIAGNOSIS — A630 Anogenital (venereal) warts: Secondary | ICD-10-CM | POA: Insufficient documentation

## 2023-10-22 NOTE — Telephone Encounter (Signed)
Discussed with patient and made him aware.  He notes that his resting heart rates have been at 100 or higher for some time. He also feels like he has had some "extra beats" he feels.  Again reassured nothing on device of note but resting heart rate is 101 on presenting.  Suggested we may want to consider increasing his Metoprolol.   He wants to discuss Metoprolol increase with [place], PA-C  in [place].  States he will send her a message now and thanks me for the call.

## 2023-10-22 NOTE — Telephone Encounter (Signed)
LM ret. Patients call.   Patient's transmission is normal. No NSVT or other episodes noted. Normal device function. Presenting is AS/VS 101 bpm.   *There have only been 2 short events of NSVT in the entire life of the device. (2019) Patient may need reassurance that past NSVT were benign and not concerning.

## 2023-10-22 NOTE — Telephone Encounter (Signed)
Pt called in stating he has sent in a transmission 11/24 and would like a nurse to look at it because he was told that he was in NSVT and would like a call back

## 2023-10-23 NOTE — Telephone Encounter (Signed)
His NSVT is benign. Watchful waiting.

## 2023-11-02 ENCOUNTER — Ambulatory Visit: Payer: Commercial Managed Care - PPO | Admitting: Cardiology

## 2023-11-19 ENCOUNTER — Encounter: Payer: Self-pay | Admitting: Gastroenterology

## 2023-11-19 ENCOUNTER — Ambulatory Visit (INDEPENDENT_AMBULATORY_CARE_PROVIDER_SITE_OTHER): Payer: Commercial Managed Care - PPO | Admitting: Gastroenterology

## 2023-11-19 VITALS — BP 137/87 | HR 94 | Temp 98.2°F | Ht 73.5 in | Wt 275.2 lb

## 2023-11-19 DIAGNOSIS — Z8719 Personal history of other diseases of the digestive system: Secondary | ICD-10-CM

## 2023-11-19 DIAGNOSIS — K5900 Constipation, unspecified: Secondary | ICD-10-CM

## 2023-11-19 DIAGNOSIS — K625 Hemorrhage of anus and rectum: Secondary | ICD-10-CM

## 2023-11-19 DIAGNOSIS — A630 Anogenital (venereal) warts: Secondary | ICD-10-CM

## 2023-11-19 DIAGNOSIS — K602 Anal fissure, unspecified: Secondary | ICD-10-CM

## 2023-11-19 DIAGNOSIS — K641 Second degree hemorrhoids: Secondary | ICD-10-CM
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
## 2023-11-19 NOTE — Patient Instructions (Addendum)
Compounded [employer] cream with lidocaine and 0.2% nifedipine 3 times a day for 2 weeks.  Please call the office if pain begins to worsen after treatment for 2-3 weeks. Increase Colace to 3 times a day or switch to senna/Colace combination twice daily. Continue Metamucil  If you have any ongoing rectal bleeding, more than 3-4 episodes in a day and feeling of a tolerable then should go to the ED.  Altamese Cabal Christmas and a happy new year!  It was a pleasure to see you today. I want to create trusting relationships with patients. If you receive a survey regarding your visit,  I greatly appreciate you taking time to fill this out on paper or through your MyChart. I value your feedback.  Brooke Bonito, MSN, FNP-BC, AGACNP-BC [facility]

## 2023-11-19 NOTE — Telephone Encounter (Signed)
Pt has been scheduled to see Brooke Bonito NP on 11/19/2023

## 2023-12-04 ENCOUNTER — Telehealth: Payer: Self-pay

## 2023-12-04 ENCOUNTER — Ambulatory Visit (INDEPENDENT_AMBULATORY_CARE_PROVIDER_SITE_OTHER): Payer: Commercial Managed Care - PPO

## 2023-12-04 DIAGNOSIS — I442 Atrioventricular block, complete: Secondary | ICD-10-CM | POA: Diagnosis not present

## 2023-12-04 NOTE — Telephone Encounter (Signed)
 Pt wants the nurse to review his 12/03/2023 transmission and call him back to let him know how it is.

## 2023-12-04 NOTE — Telephone Encounter (Signed)
 PT advised remote transmission appears normal and no alerts. Pt was appreciative of call.

## 2023-12-05 LAB — CUP PACEART REMOTE DEVICE CHECK
Battery Remaining Longevity: 112 mo
Battery Voltage: 2.93 V
Brady Statistic AP VP Percent: 0.01 %
Brady Statistic AP VS Percent: 0.13 %
Brady Statistic AS VP Percent: 0.01 %
Brady Statistic AS VS Percent: 99.86 %
Brady Statistic RA Percent Paced: 0.17 %
Brady Statistic RV Percent Paced: 0.02 %
Date Time Interrogation Session: 20250106234241
Implantable Lead Connection Status: 753985
Implantable Lead Connection Status: 753985
Implantable Lead Implant Date: 20190123
Implantable Lead Implant Date: 20190123
Implantable Lead Location: 753859
Implantable Lead Location: 753860
Implantable Lead Model: 5076
Implantable Lead Model: 5076
Implantable Pulse Generator Implant Date: 20190123
Lead Channel Impedance Value: 304 Ohm
Lead Channel Impedance Value: 323 Ohm
Lead Channel Impedance Value: 380 Ohm
Lead Channel Impedance Value: 418 Ohm
Lead Channel Pacing Threshold Amplitude: 0.875 V
Lead Channel Pacing Threshold Amplitude: 1 V
Lead Channel Pacing Threshold Pulse Width: 0.4 ms
Lead Channel Pacing Threshold Pulse Width: 0.4 ms
Lead Channel Sensing Intrinsic Amplitude: 2.375 mV
Lead Channel Sensing Intrinsic Amplitude: 2.375 mV
Lead Channel Sensing Intrinsic Amplitude: 8.75 mV
Lead Channel Sensing Intrinsic Amplitude: 8.75 mV
Lead Channel Setting Pacing Amplitude: 2 V
Lead Channel Setting Pacing Amplitude: 2.5 V
Lead Channel Setting Pacing Pulse Width: 0.4 ms
Lead Channel Setting Sensing Sensitivity: 1.2 mV
Zone Setting Status: 755011
Zone Setting Status: 755011

## 2023-12-12 ENCOUNTER — Telehealth (INDEPENDENT_AMBULATORY_CARE_PROVIDER_SITE_OTHER): Payer: Self-pay | Admitting: Otolaryngology

## 2023-12-12 NOTE — Telephone Encounter (Signed)
 LVM to confirm appt & location [phone] [id number]

## 2023-12-13 ENCOUNTER — Encounter (INDEPENDENT_AMBULATORY_CARE_PROVIDER_SITE_OTHER): Payer: Self-pay

## 2023-12-13 ENCOUNTER — Ambulatory Visit (INDEPENDENT_AMBULATORY_CARE_PROVIDER_SITE_OTHER): Payer: Commercial Managed Care - PPO | Admitting: Otolaryngology

## 2023-12-13 VITALS — HR 83 | Ht 73.0 in | Wt 269.0 lb

## 2023-12-13 DIAGNOSIS — R0981 Nasal congestion: Secondary | ICD-10-CM

## 2023-12-13 DIAGNOSIS — J310 Chronic rhinitis: Secondary | ICD-10-CM | POA: Diagnosis not present

## 2023-12-13 DIAGNOSIS — J343 Hypertrophy of nasal turbinates: Secondary | ICD-10-CM

## 2023-12-15 DIAGNOSIS — J343 Hypertrophy of nasal turbinates: Secondary | ICD-10-CM | POA: Insufficient documentation

## 2023-12-15 DIAGNOSIS — J310 Chronic rhinitis: Secondary | ICD-10-CM | POA: Insufficient documentation

## 2023-12-15 NOTE — Progress Notes (Signed)
Patient ID: Bill Castro, male   DOB: 1961/10/07, 63 y.o.   MRN: [id number]  Follow-up: Recurrent sinusitis, chronic nasal congestion  HPI: The patient is a 63 year old male who returns today for his follow-up evaluation.  He was last seen in August 2023.  At that time, he was experiencing recurrent sinusitis and chronic nasal congestion.  He was noted to have nasal mucosal congestion and bilateral inferior turbinate hypertrophy.  He was treated with Flonase nasal spray.  The patient returns today complaining of intermittent right nasal pain.  The pain started after he was fitted with a CPAP machine.  He denies any fever or visual change.  His nasal congestion is currently under control.  Exam: General: Communicates without difficulty, well nourished, no acute distress. Head: Normocephalic, no evidence injury, no tenderness, facial buttresses intact without stepoff. Face/sinus: No tenderness to palpation and percussion. Facial movement is normal and symmetric. Eyes: PERRL, EOMI. No scleral icterus, conjunctivae clear. Neuro: CN II exam reveals vision grossly intact.  No nystagmus at any point of gaze. Ears: Auricles well formed without lesions.  Ear canals are intact without mass or lesion.  No erythema or edema is appreciated.  The TMs are intact without fluid. Nose: External evaluation reveals an area of abrasion over the right nasal dorsum.  Anterior rhinoscopy reveals congested mucosa over anterior aspect of inferior turbinates and intact septum.  No purulence noted. Oral:  Oral cavity and oropharynx are intact, symmetric, without erythema or edema.  Mucosa is moist without lesions. Neck: Full range of motion without pain.  There is no significant lymphadenopathy.  No masses palpable.  Thyroid bed within normal limits to palpation.  Parotid glands and submandibular glands equal bilaterally without mass.  Trachea is midline. Neuro:  CN 2-12 grossly intact.   Assessment: 1.  Chronic rhinitis with nasal  mucosal congestion and bilateral inferior turbinate hypertrophy. 2.  His right nasal pain may be secondary to pressure from his CPAP mask.  No acute infection is noted today.  Plan: 1.  The physical exam findings are reviewed with the patient. 2.  The patient is reassured that no acute infection is noted today. 2.  Continue Flonase nasal spray as needed. 4.  The patient may benefit from trying different masks for his CPAP machine.

## 2024-01-02 ENCOUNTER — Ambulatory Visit: Payer: Commercial Managed Care - PPO | Admitting: Gastroenterology

## 2024-01-15 NOTE — Addendum Note (Signed)
Addended by: Geralyn Flash D on: 01/15/2024 11:46 AM   Modules accepted: Orders

## 2024-01-15 NOTE — Progress Notes (Signed)
 Remote pacemaker transmission.

## 2024-03-04 ENCOUNTER — Ambulatory Visit (INDEPENDENT_AMBULATORY_CARE_PROVIDER_SITE_OTHER): Payer: Commercial Managed Care - PPO

## 2024-03-04 DIAGNOSIS — I442 Atrioventricular block, complete: Secondary | ICD-10-CM

## 2024-03-05 ENCOUNTER — Encounter: Payer: Self-pay | Admitting: Internal Medicine

## 2024-03-05 LAB — CUP PACEART REMOTE DEVICE CHECK
Battery Remaining Longevity: 109 mo
Battery Voltage: 2.92 V
Brady Statistic AP VP Percent: 0.21 %
Brady Statistic AP VS Percent: 0.39 %
Brady Statistic AS VP Percent: 0 %
Brady Statistic AS VS Percent: 99.4 %
Brady Statistic RA Percent Paced: 0.6 %
Brady Statistic RV Percent Paced: 0.21 %
Date Time Interrogation Session: 20250408004400
Implantable Lead Connection Status: 753985
Implantable Lead Connection Status: 753985
Implantable Lead Implant Date: 20190123
Implantable Lead Implant Date: 20190123
Implantable Lead Location: 753859
Implantable Lead Location: 753860
Implantable Lead Model: 5076
Implantable Lead Model: 5076
Implantable Pulse Generator Implant Date: 20190123
Lead Channel Impedance Value: 304 Ohm
Lead Channel Impedance Value: 323 Ohm
Lead Channel Impedance Value: 380 Ohm
Lead Channel Impedance Value: 418 Ohm
Lead Channel Pacing Threshold Amplitude: 0.875 V
Lead Channel Pacing Threshold Amplitude: 1 V
Lead Channel Pacing Threshold Pulse Width: 0.4 ms
Lead Channel Pacing Threshold Pulse Width: 0.4 ms
Lead Channel Sensing Intrinsic Amplitude: 2.625 mV
Lead Channel Sensing Intrinsic Amplitude: 2.625 mV
Lead Channel Sensing Intrinsic Amplitude: 8.875 mV
Lead Channel Sensing Intrinsic Amplitude: 8.875 mV
Lead Channel Setting Pacing Amplitude: 2 V
Lead Channel Setting Pacing Amplitude: 2.5 V
Lead Channel Setting Pacing Pulse Width: 0.4 ms
Lead Channel Setting Sensing Sensitivity: 1.2 mV
Zone Setting Status: 755011
Zone Setting Status: 755011

## 2024-03-05 NOTE — Telephone Encounter (Signed)
 Spoke with patient.  Reassured him transmission was normal and explained what AS/VS means.  Patient thanks me for call.

## 2024-03-07 ENCOUNTER — Ambulatory Visit: Attending: Cardiology | Admitting: Cardiology

## 2024-03-07 ENCOUNTER — Encounter: Payer: Self-pay | Admitting: Cardiology

## 2024-03-07 VITALS — BP 130/82 | HR 81 | Ht 73.0 in | Wt 271.8 lb

## 2024-03-07 DIAGNOSIS — I25118 Atherosclerotic heart disease of native coronary artery with other forms of angina pectoris: Secondary | ICD-10-CM | POA: Diagnosis not present

## 2024-03-07 DIAGNOSIS — I10 Essential (primary) hypertension: Secondary | ICD-10-CM | POA: Diagnosis not present

## 2024-03-07 DIAGNOSIS — E782 Mixed hyperlipidemia: Secondary | ICD-10-CM

## 2024-03-07 NOTE — Patient Instructions (Signed)
 Medication Instructions:  Your physician recommends that you continue on your current medications as directed. Please refer to the Current Medication list given to you today.  *If you need a refill on your cardiac medications before your next appointment, please call your pharmacy*  Lab Work: None If you have labs (blood work) drawn today and your tests are completely normal, you will receive your results only by: MyChart Message (if you have MyChart) OR A paper copy in the mail If you have any lab test that is abnormal or we need to change your treatment, we will call you to review the results.  Testing/Procedures: Noen  Follow-Up: At [facility], you and your health needs are our priority.  As part of our continuing mission to provide you with exceptional heart care, our providers are all part of one team.  This team includes your primary Cardiologist (physician) and Advanced Practice Providers or APPs (Physician Assistants and Nurse Practitioners) who all work together to provide you with the care you need, when you need it.  Your next appointment:   6 month(s)  Provider:   You may see Dina Rich, MD or one of the following Advanced Practice Providers on your designated Care Team:   Randall An, PA-C  [place], [place] Jacolyn Reedy, [place]     We recommend signing up for the patient portal called "MyChart".  Sign up information is provided on this After Visit Summary.  MyChart is used to connect with patients for Virtual Visits (Telemedicine).  Patients are able to view lab/test results, encounter notes, upcoming appointments, etc.  Non-urgent messages can be sent to your provider as well.   To learn more about what you can do with MyChart, go to [email or web address].   Other Instructions
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
## 2024-03-15 ENCOUNTER — Other Ambulatory Visit: Payer: Self-pay | Admitting: Cardiology

## 2024-03-24 ENCOUNTER — Ambulatory Visit: Admitting: Physician Assistant

## 2024-04-22 NOTE — Addendum Note (Signed)
 Addended by: Lott Rouleau A on: 04/22/2024 09:55 AM   Modules accepted: Orders

## 2024-04-22 NOTE — Progress Notes (Signed)
 Remote pacemaker transmission.

## 2024-05-06 ENCOUNTER — Ambulatory Visit: Admitting: Student

## 2024-05-07 ENCOUNTER — Ambulatory Visit (HOSPITAL_COMMUNITY)
Admission: RE | Admit: 2024-05-07 | Discharge: 2024-05-07 | Disposition: A | Discharge status: Home / Self Care | Source: Ambulatory Visit | Attending: Internal Medicine | Admitting: Internal Medicine

## 2024-05-07 ENCOUNTER — Other Ambulatory Visit (HOSPITAL_COMMUNITY): Payer: Self-pay | Admitting: Internal Medicine

## 2024-05-07 DIAGNOSIS — M546 Pain in thoracic spine: Secondary | ICD-10-CM

## 2024-05-08 ENCOUNTER — Encounter: Payer: Self-pay | Admitting: Cardiology

## 2024-05-28 NOTE — Telephone Encounter (Signed)
 Do not suspect any recent cardiac changes, stress test within the last year was benign. Continue to monitor  JINNY Ross MD

## 2024-06-03 ENCOUNTER — Ambulatory Visit (INDEPENDENT_AMBULATORY_CARE_PROVIDER_SITE_OTHER): Payer: Commercial Managed Care - PPO

## 2024-06-03 DIAGNOSIS — I442 Atrioventricular block, complete: Secondary | ICD-10-CM

## 2024-06-04 LAB — CUP PACEART REMOTE DEVICE CHECK
Battery Remaining Longevity: 106 mo
Battery Voltage: 2.92 V
Brady Statistic AP VP Percent: 1.24 %
Brady Statistic AP VS Percent: 1.17 %
Brady Statistic AS VP Percent: 0 %
Brady Statistic AS VS Percent: 97.59 %
Brady Statistic RA Percent Paced: 2.41 %
Brady Statistic RV Percent Paced: 1.24 %
Date Time Interrogation Session: 20250708004241
Implantable Lead Connection Status: 753985
Implantable Lead Connection Status: 753985
Implantable Lead Implant Date: 20190123
Implantable Lead Implant Date: 20190123
Implantable Lead Location: 753859
Implantable Lead Location: 753860
Implantable Lead Model: 5076
Implantable Lead Model: 5076
Implantable Pulse Generator Implant Date: 20190123
Lead Channel Impedance Value: 304 Ohm
Lead Channel Impedance Value: 323 Ohm
Lead Channel Impedance Value: 361 Ohm
Lead Channel Impedance Value: 399 Ohm
Lead Channel Pacing Threshold Amplitude: 0.875 V
Lead Channel Pacing Threshold Amplitude: 1 V
Lead Channel Pacing Threshold Pulse Width: 0.4 ms
Lead Channel Pacing Threshold Pulse Width: 0.4 ms
Lead Channel Sensing Intrinsic Amplitude: 10.25 mV
Lead Channel Sensing Intrinsic Amplitude: 10.25 mV
Lead Channel Sensing Intrinsic Amplitude: 2.125 mV
Lead Channel Sensing Intrinsic Amplitude: 2.125 mV
Lead Channel Setting Pacing Amplitude: 2 V
Lead Channel Setting Pacing Amplitude: 2.5 V
Lead Channel Setting Pacing Pulse Width: 0.4 ms
Lead Channel Setting Sensing Sensitivity: 1.2 mV
Zone Setting Status: 755011
Zone Setting Status: 755011

## 2024-06-05 ENCOUNTER — Ambulatory Visit: Payer: Self-pay | Admitting: Internal Medicine

## 2024-07-23 ENCOUNTER — Encounter: Payer: Self-pay | Admitting: Cardiology

## 2024-07-24 ENCOUNTER — Other Ambulatory Visit: Payer: Self-pay | Admitting: *Deleted

## 2024-07-24 MED ORDER — ATORVASTATIN CALCIUM 40 MG PO TABS: 40.0000 mg | ORAL_TABLET | Freq: Every day | ORAL | 3 refills | Status: DC

## 2024-07-24 NOTE — Telephone Encounter (Signed)
 I would defer the blood sugar and eosinophilia to his pcp. Cholesterol is too high in general. His goal LDL would be <70, would increase his atorvastatin  to 40mg  daily. Triglycerides have increased, there are medications for this but very often they improve with diet changes and weight loss. There are several foods high in triglycerides that generally are red meats, fried foods, sweets/cookies/pastries/ice creams, sodas, complex cabrs like pasta/white rice/potatos, beers/liquors. I would suggest doing an internet search as there are extensive lists that are provided. HDL typically also improves with exercise and weight loss. For now would increase the atorvastatin  to lower the LDL, HDL and TGs will improve with dietary changes and weight loss that will also help his prediabetes  JINNY Ross MD

## 2024-07-25 ENCOUNTER — Telehealth: Payer: Self-pay | Admitting: Cardiology

## 2024-07-25 ENCOUNTER — Other Ambulatory Visit: Payer: Self-pay | Admitting: Gastroenterology

## 2024-07-25 DIAGNOSIS — R1011 Right upper quadrant pain: Secondary | ICD-10-CM

## 2024-07-25 DIAGNOSIS — K219 Gastro-esophageal reflux disease without esophagitis: Secondary | ICD-10-CM

## 2024-07-25 DIAGNOSIS — I714 Abdominal aortic aneurysm, without rupture, unspecified: Secondary | ICD-10-CM

## 2024-07-25 NOTE — Telephone Encounter (Signed)
 Atorvastatin  was increased yesterday, patient has taken on pill.  Please advise.

## 2024-07-25 NOTE — Telephone Encounter (Signed)
 Called number provided by pt: No prior authorization is required [phone]

## 2024-07-25 NOTE — Telephone Encounter (Signed)
 Pt c/o medication issue:  1. Name of Medication:   atorvastatin  (LIPITOR) 40 MG tablet   2. How are you currently taking this medication (dosage and times per day)?   As prescribed  3. Are you having a reaction (difficulty breathing--STAT)?   4. What is your medication issue?     Patient stated he started on the increased dosage of this medication and his muscles have been very sore.  Patient wants advice on how to address this.

## 2024-07-30 NOTE — Telephone Encounter (Signed)
 Sent MD response back to patient via MyChart

## 2024-08-04 ENCOUNTER — Ambulatory Visit (HOSPITAL_COMMUNITY)
Admission: RE | Admit: 2024-08-04 | Discharge: 2024-08-04 | Disposition: A | Discharge status: Home / Self Care | Source: Ambulatory Visit | Attending: Gastroenterology | Admitting: Gastroenterology

## 2024-08-04 DIAGNOSIS — R1011 Right upper quadrant pain: Secondary | ICD-10-CM | POA: Diagnosis present

## 2024-08-04 DIAGNOSIS — K219 Gastro-esophageal reflux disease without esophagitis: Secondary | ICD-10-CM | POA: Diagnosis present

## 2024-08-04 DIAGNOSIS — I714 Abdominal aortic aneurysm, without rupture, unspecified: Secondary | ICD-10-CM | POA: Diagnosis present

## 2024-08-08 ENCOUNTER — Ambulatory Visit: Payer: Self-pay | Admitting: Gastroenterology

## 2024-08-17 ENCOUNTER — Emergency Department (HOSPITAL_COMMUNITY)

## 2024-08-17 ENCOUNTER — Encounter (HOSPITAL_COMMUNITY): Payer: Self-pay

## 2024-08-17 ENCOUNTER — Other Ambulatory Visit: Payer: Self-pay

## 2024-08-17 ENCOUNTER — Emergency Department (HOSPITAL_COMMUNITY)
Admission: EM | Admit: 2024-08-17 | Discharge: 2024-08-17 | Disposition: A | Discharge status: Home / Self Care | Attending: Emergency Medicine | Admitting: Emergency Medicine

## 2024-08-17 ENCOUNTER — Encounter: Payer: Self-pay | Admitting: Cardiology

## 2024-08-17 DIAGNOSIS — Z79899 Other long term (current) drug therapy: Secondary | ICD-10-CM | POA: Insufficient documentation

## 2024-08-17 DIAGNOSIS — M25512 Pain in left shoulder: Secondary | ICD-10-CM | POA: Insufficient documentation

## 2024-08-17 DIAGNOSIS — R1011 Right upper quadrant pain: Secondary | ICD-10-CM | POA: Insufficient documentation

## 2024-08-17 DIAGNOSIS — I2510 Atherosclerotic heart disease of native coronary artery without angina pectoris: Secondary | ICD-10-CM | POA: Diagnosis not present

## 2024-08-17 DIAGNOSIS — Z7982 Long term (current) use of aspirin: Secondary | ICD-10-CM | POA: Diagnosis not present

## 2024-08-17 DIAGNOSIS — R079 Chest pain, unspecified: Secondary | ICD-10-CM | POA: Insufficient documentation

## 2024-08-17 DIAGNOSIS — E039 Hypothyroidism, unspecified: Secondary | ICD-10-CM | POA: Diagnosis not present

## 2024-08-17 DIAGNOSIS — R0781 Pleurodynia: Secondary | ICD-10-CM | POA: Diagnosis not present

## 2024-08-17 DIAGNOSIS — Z950 Presence of cardiac pacemaker: Secondary | ICD-10-CM | POA: Diagnosis not present

## 2024-08-17 DIAGNOSIS — M25511 Pain in right shoulder: Secondary | ICD-10-CM | POA: Insufficient documentation

## 2024-08-17 DIAGNOSIS — Z85828 Personal history of other malignant neoplasm of skin: Secondary | ICD-10-CM | POA: Insufficient documentation

## 2024-08-17 DIAGNOSIS — I10 Essential (primary) hypertension: Secondary | ICD-10-CM | POA: Insufficient documentation

## 2024-08-17 LAB — CBC
HCT: 45.5 % (ref 39.0–52.0)
Hemoglobin: 15.4 g/dL (ref 13.0–17.0)
MCH: 32.6 pg (ref 26.0–34.0)
MCHC: 33.8 g/dL (ref 30.0–36.0)
MCV: 96.4 fL (ref 80.0–100.0)
Platelets: 282 K/uL (ref 150–400)
RBC: 4.72 MIL/uL (ref 4.22–5.81)
RDW: 12.5 % (ref 11.5–15.5)
WBC: 7.9 K/uL (ref 4.0–10.5)
nRBC: 0 % (ref 0.0–0.2)

## 2024-08-17 LAB — HEPATIC FUNCTION PANEL
ALT: 27 U/L (ref 0–44)
AST: 31 U/L (ref 15–41)
Albumin: 4.1 g/dL (ref 3.5–5.0)
Alkaline Phosphatase: 75 U/L (ref 38–126)
Bilirubin, Direct: <0.1 mg/dL (ref 0.0–0.2)
Total Bilirubin: 0.5 mg/dL (ref 0.0–1.2)
Total Protein: 6.9 g/dL (ref 6.5–8.1)

## 2024-08-17 LAB — BASIC METABOLIC PANEL WITH GFR
Anion gap: 12 (ref 5–15)
BUN: 8 mg/dL (ref 8–23)
CO2: 23 mmol/L (ref 22–32)
Calcium: 9.2 mg/dL (ref 8.9–10.3)
Chloride: 106 mmol/L (ref 98–111)
Creatinine, Ser: 0.93 mg/dL (ref 0.61–1.24)
GFR, Estimated: >60 mL/min (ref >60)
Glucose, Bld: 140 mg/dL — ABNORMAL HIGH (ref 70–99)
Potassium: 3.8 mmol/L (ref 3.5–5.1)
Sodium: 141 mmol/L (ref 135–145)

## 2024-08-17 LAB — TROPONIN I (HIGH SENSITIVITY)
Troponin I (High Sensitivity): 7 ng/L (ref <18)
Troponin I (High Sensitivity): 7 ng/L (ref <18)

## 2024-08-17 NOTE — ED Notes (Signed)
 Patient concerned about his pet and would like to go home/not be admitted if at all possible.

## 2024-08-17 NOTE — Discharge Instructions (Signed)
 Decrease your atorvastatin  back down to 20 mg and keep your appt with Dr. Alvan on Wed.

## 2024-08-17 NOTE — ED Triage Notes (Addendum)
 Pt stated that he is having rib pain for the last couple weeks. Pt also complaining of arm heaviness in both arms, back pain and getting easily winded for the last couple of weeks as well. Pt does have pacemaker

## 2024-08-17 NOTE — ED Provider Notes (Signed)
 [place] EMERGENCY DEPARTMENT AT [facility] Provider Note   CSN: [id number] Arrival date & time: 08/17/24  1124     Patient presents with: Chest Pain   Bill Castro is a 63 y.o. male.   Pt is a 63 yo male with pmhx significant for CAD, HLD, cardiomyopathy, hypothyroidism, complete HB s/p pacemaker placement, skin cancer, HTN, and GERD.  Pt comes in today with heaviness and aching to his shoulders and ribs.  He has some RUQ pain for which he's seen GI (neg US  and HIDA).  His cardiologist, Dr. Alvan recently increased his atorvastatin  dose to 40 mg to help bring down his TGs.  Pt said sx started after this medication change.       Prior to Admission medications   Medication Sig Start Date End Date Taking? Authorizing Provider  acetaminophen  (TYLENOL ) 500 MG tablet Take 1,000 mg by mouth every 6 (six) hours as needed (back pain).     [provider]  ALPRAZolam SHEFFIELD) 1 MG tablet Take 1 mg by mouth 2 (two) times daily as needed. Usually takes 1 tablet at bedtime 12/28/17   [provider]  aspirin  EC 81 MG tablet Take 81 mg by mouth daily with lunch.     [provider]  atorvastatin  (LIPITOR) 40 MG tablet Take 1 tablet (40 mg total) by mouth daily. 07/24/24 10/22/24  Alvan Dorn FALCON, MD  cholecalciferol (VITAMIN D3) 25 MCG (1000 UNIT) tablet Take 1,000 Units by mouth daily.    [provider]  cyclobenzaprine (FLEXERIL) 10 MG tablet Take by mouth. 09/19/23   [provider]  dicyclomine  (BENTYL ) 10 MG capsule TAKE 1 CAPSULE BY MOUTH UP TO FOUR TIMES DAILY FOR LOOSE STOOLS OR ABDOMINAL PAIN 08/01/23   Ezzard Sonny RAMAN, PA-C  hydrocortisone  (ANUSOL -HC) 2.5 % rectal cream Place 1 Application rectally 2 (two) times daily. 09/14/23   Shirlean Therisa ORN, NP  levothyroxine  (SYNTHROID ) 100 MCG tablet Take 100 mcg by mouth daily before breakfast.    [provider]  lisinopril  (ZESTRIL ) 5 MG tablet TAKE 1 TABLET(5 MG) BY MOUTH DAILY  03/17/24   Alvan Dorn FALCON, MD  metoprolol  succinate (TOPROL -XL) 50 MG 24 hr tablet TAKE 1 AND 1/2 TABLETS(75 MG) BY MOUTH DAILY WITH OR IMMEDIATELY FOLLOWING A MEAL 03/17/24   Alvan Dorn FALCON, MD  Multiple Vitamin (MULTIVITAMIN) tablet Take 1 tablet by mouth daily.    [provider]  nitroGLYCERIN  (NITROSTAT ) 0.4 MG SL tablet Place 1 tablet (0.4 mg total) under the tongue every 5 (five) minutes as needed for chest pain. 10/14/19   Strader, Laymon HERO, PA-C  Omega-3 Fatty Acids (FISH OIL) 1200 MG CAPS Take 1,200 mg by mouth daily.    [provider]  pantoprazole  (PROTONIX ) 40 MG tablet Take 1 tablet (40 mg total) by mouth 2 (two) times daily before a meal. Patient taking differently: Take 40 mg by mouth daily. 07/31/23   Ezzard Sonny RAMAN, PA-C  traMADol (ULTRAM) 50 MG tablet Take 50 mg by mouth as needed. 08/02/23   [provider]    Allergies: Erythromycin, Imdur  [isosorbide  nitrate], Penicillins, and Prednisone     Review of Systems  Musculoskeletal:  Positive for myalgias.  All other systems reviewed and are negative.   Updated Vital Signs BP 101/72 (BP Location: Left Arm)   Pulse 62   Temp 97.8 F (36.6 C) (Oral)   Resp 17   Ht 6' 1 (1.854 m)   Wt 115.7 kg   SpO2  95%   BMI 33.64 kg/m   Physical Exam Vitals and nursing note reviewed.  Constitutional:      Appearance: He is well-developed. He is obese.  HENT:     Head: Normocephalic and atraumatic.  Eyes:     Extraocular Movements: Extraocular movements intact.     Pupils: Pupils are equal, round, and reactive to light.  Cardiovascular:     Rate and Rhythm: Normal rate and regular rhythm.     Heart sounds: Normal heart sounds.  Pulmonary:     Effort: Pulmonary effort is normal.     Breath sounds: Normal breath sounds.  Abdominal:     General: Bowel sounds are normal.     Palpations: Abdomen is soft.  Musculoskeletal:        General: Normal range of motion.     Cervical back: Normal range  of motion and neck supple.  Skin:    General: Skin is warm.     Capillary Refill: Capillary refill takes less than 2 seconds.  Neurological:     General: No focal deficit present.     Mental Status: He is alert and oriented to person, place, and time.  Psychiatric:        Mood and Affect: Mood normal.        Behavior: Behavior normal.     (all labs ordered are listed, but only abnormal results are displayed) Labs Reviewed  BASIC METABOLIC PANEL WITH GFR - Abnormal; Notable for the following components:      Result Value   Glucose, Bld 140 (*)    All other components within normal limits  CBC  HEPATIC FUNCTION PANEL  TROPONIN I (HIGH SENSITIVITY)  TROPONIN I (HIGH SENSITIVITY)    EKG: EKG Interpretation Date/Time:  Sunday August 17 2024 11:35:13 EDT Ventricular Rate:  94 PR Interval:  184 QRS Duration:  86 QT Interval:  348 QTC Calculation: 435 R Axis:   0  Text Interpretation: Normal sinus rhythm Normal ECG When compared with ECG of 20-Jul-2023 13:03, PR interval has decreased No significant change since last tracing Confirmed by Dean Clarity [phone]) on 08/17/2024 12:11:01 PM  Radiology: DG Chest 2 View Result Date: 08/17/2024 CLINICAL DATA:  Chest/rib pain past couple of weeks with associated heaviness in both arms and back pain. EXAM: CHEST - 2 VIEW COMPARISON:  05/07/2024 FINDINGS: Dual lead left-sided pacemaker unchanged. Lungs are adequately inflated without focal airspace consolidation or effusion. Cardiomediastinal silhouette and remainder of the exam is unchanged. IMPRESSION: No active cardiopulmonary disease. Electronically Signed   By: Toribio Agreste M.D.   On: 08/17/2024 12:08     Procedures   Medications Ordered in the ED - No data to display                                  Medical Decision Making Amount and/or Complexity of Data Reviewed Labs: ordered. Radiology: ordered.   This patient presents to the ED for concern of cp, this involves an  extensive number of treatment options, and is a complaint that carries with it a high risk of complications and morbidity.  The differential diagnosis includes cardiac, msk, gi, statin, pulm   Co morbidities that complicate the patient evaluation  CAD, HLD, cardiomyopathy, hypothyroidism, complete HB s/p pacemaker placement, skin cancer, HTN, and GERD   Additional history obtained:  Additional history obtained from epic chart review  Lab Tests:  I Ordered, and personally interpreted  labs.  The pertinent results include:  cbc nl, bmp nl, lfts nl, trop nl   Imaging Studies ordered:  I ordered imaging studies including cxr  I independently visualized and interpreted imaging which showed No active cardiopulmonary disease.  I agree with the radiologist interpretation   Cardiac Monitoring:  The patient was maintained on a cardiac monitor.  I personally viewed and interpreted the cardiac monitored which showed an underlying rhythm of: nsr   Medicines ordered and prescription drug management:   I have reviewed the patients home medicines and have made adjustments as needed   Problem List / ED Course:  Atypical cp:  cardiac eval neg.  No suspicion for PE.  I told pt to decrease his atorvastatin  down to 20 to see if that helps.  Pt has an appt with his cardiologist on 9/24 with Dr. Alvan.  He is told to discuss this med change when he goes to that appt.  Pt knows to return if worse.     Reevaluation:  After the interventions noted above, I reevaluated the patient and found that they have :improved   Social Determinants of Health:  Lives at home   Dispostion:  After consideration of the diagnostic results and the patients response to treatment, I feel that the patent would benefit from discharge with outpatient f/u.       Final diagnoses:  Nonspecific chest pain    ED Discharge Orders     None          Dean Clarity, MD 08/17/24 [phone]

## 2024-08-20 ENCOUNTER — Encounter: Payer: Self-pay | Admitting: Cardiology

## 2024-08-20 ENCOUNTER — Ambulatory Visit: Attending: Cardiology | Admitting: Cardiology

## 2024-08-20 VITALS — BP 118/84 | HR 83 | Ht 73.0 in | Wt 255.8 lb

## 2024-08-20 DIAGNOSIS — R0789 Other chest pain: Secondary | ICD-10-CM | POA: Diagnosis not present

## 2024-08-20 DIAGNOSIS — I10 Essential (primary) hypertension: Secondary | ICD-10-CM

## 2024-08-20 DIAGNOSIS — I25118 Atherosclerotic heart disease of native coronary artery with other forms of angina pectoris: Secondary | ICD-10-CM | POA: Diagnosis not present

## 2024-08-20 DIAGNOSIS — E782 Mixed hyperlipidemia: Secondary | ICD-10-CM | POA: Diagnosis not present

## 2024-08-20 NOTE — Patient Instructions (Signed)
 Medication Instructions:  Your physician recommends that you continue on your current medications as directed. Please refer to the Current Medication list given to you today.  *If you need a refill on your cardiac medications before your next appointment, please call your pharmacy*  Lab Work: None If you have labs (blood work) drawn today and your tests are completely normal, you will receive your results only by: MyChart Message (if you have MyChart) OR A paper copy in the mail If you have any lab test that is abnormal or we need to change your treatment, we will call you to review the results.  Testing/Procedures: None  Follow-Up: At [facility], you and your health needs are our priority.  As part of our continuing mission to provide you with exceptional heart care, our providers are all part of one team.  This team includes your primary Cardiologist (physician) and Advanced Practice Providers or APPs (Physician Assistants and Nurse Practitioners) who all work together to provide you with the care you need, when you need it.  Your next appointment:   3 month(s)  Provider:   You may see Alvan Carrier, MD or one of the following Advanced Practice Providers on your designated Care Team:   Laymon Qua, PA-C  [place], [place] Olivia [place], [place]     We recommend signing up for the patient portal called MyChart.  Sign up information is provided on this After Visit Summary.  MyChart is used to connect with patients for Virtual Visits (Telemedicine).  Patients are able to view lab/test results, encounter notes, upcoming appointments, etc.  Non-urgent messages can be sent to your provider as well.   To learn more about what you can do with MyChart, go to [email or web address].   Other Instructions
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
## 2024-09-02 ENCOUNTER — Ambulatory Visit: Payer: Commercial Managed Care - PPO

## 2024-09-02 ENCOUNTER — Ambulatory Visit: Admitting: Internal Medicine

## 2024-09-02 DIAGNOSIS — R002 Palpitations: Secondary | ICD-10-CM | POA: Diagnosis not present

## 2024-09-03 LAB — CUP PACEART REMOTE DEVICE CHECK
Battery Remaining Longevity: 96 mo
Battery Voltage: 2.91 V
Brady Statistic AP VP Percent: 9.39 %
Brady Statistic AP VS Percent: 3.21 %
Brady Statistic AS VP Percent: 0 %
Brady Statistic AS VS Percent: 87.4 %
Brady Statistic RA Percent Paced: 12.6 %
Brady Statistic RV Percent Paced: 9.39 %
Date Time Interrogation Session: 20251007004222
Implantable Lead Connection Status: 753985
Implantable Lead Connection Status: 753985
Implantable Lead Implant Date: 20190123
Implantable Lead Implant Date: 20190123
Implantable Lead Location: 753859
Implantable Lead Location: 753860
Implantable Lead Model: 5076
Implantable Lead Model: 5076
Implantable Pulse Generator Implant Date: 20190123
Lead Channel Impedance Value: 304 Ohm
Lead Channel Impedance Value: 323 Ohm
Lead Channel Impedance Value: 380 Ohm
Lead Channel Impedance Value: 418 Ohm
Lead Channel Pacing Threshold Amplitude: 0.875 V
Lead Channel Pacing Threshold Amplitude: 1 V
Lead Channel Pacing Threshold Pulse Width: 0.4 ms
Lead Channel Pacing Threshold Pulse Width: 0.4 ms
Lead Channel Sensing Intrinsic Amplitude: 1.5 mV
Lead Channel Sensing Intrinsic Amplitude: 1.5 mV
Lead Channel Sensing Intrinsic Amplitude: 10.5 mV
Lead Channel Sensing Intrinsic Amplitude: 10.5 mV
Lead Channel Setting Pacing Amplitude: 2 V
Lead Channel Setting Pacing Amplitude: 2.5 V
Lead Channel Setting Pacing Pulse Width: 0.4 ms
Lead Channel Setting Sensing Sensitivity: 1.2 mV
Zone Setting Status: 755011
Zone Setting Status: 755011

## 2024-09-04 ENCOUNTER — Other Ambulatory Visit: Payer: Self-pay

## 2024-09-04 ENCOUNTER — Ambulatory Visit: Payer: Self-pay | Admitting: Internal Medicine

## 2024-09-04 MED ORDER — ESOMEPRAZOLE MAGNESIUM 40 MG PO CPDR: 40.0000 mg | DELAYED_RELEASE_CAPSULE | Freq: Two times a day (BID) | ORAL | 3 refills | Status: DC

## 2024-09-05 NOTE — Progress Notes (Signed)
 Remote PPM Transmission

## 2024-09-12 ENCOUNTER — Other Ambulatory Visit: Payer: Self-pay | Admitting: Cardiology

## 2024-09-16 ENCOUNTER — Encounter: Payer: Self-pay | Admitting: General Surgery

## 2024-09-16 ENCOUNTER — Ambulatory Visit: Admitting: General Surgery

## 2024-09-16 VITALS — BP 117/84 | HR 88 | Temp 98.0°F | Resp 16 | Ht 73.0 in | Wt 249.0 lb

## 2024-09-16 DIAGNOSIS — S31809A Unspecified open wound of unspecified buttock, initial encounter: Secondary | ICD-10-CM | POA: Insufficient documentation

## 2024-09-16 DIAGNOSIS — A630 Anogenital (venereal) warts: Secondary | ICD-10-CM

## 2024-09-16 MED ORDER — HYDROCORTISONE 2.5 % EX CREA: TOPICAL_CREAM | Freq: Two times a day (BID) | CUTANEOUS | 0 refills | Status: AC

## 2024-09-16 NOTE — Patient Instructions (Addendum)
 Small right posterior hemorrhoid noted today but was soft. Would keep doing sitz baths and hydrocortisone  cream (I have reordered this) and keep your stools regular and soft. Follow up with Dr. Royston, Colorectal with regards to the perianal lesions.   No obvious pilonidal cyst/ sinus noted on the exam today. Keep an eye on the area. It may be that the skin of the gluteal cleft got irritated and bled. Here is information on pilonidals in case something develops. It may be that I need to see it inflamed to really get a better idea of what is going on in that area.  Pilonidal Cyst Information   Pilonidal cysts/ sinuses are abnormal tissue under the skin that is prone to infections. It is more common in patients that are overweight, have a family history of a pilonidal cyst, have a deep gluteal cleft (butt crack) and thicker body hair, especially thicker hair in the region of the gluteal cleft. The exact cause of the disease is unknown but it could be related to ingrown hairs and inflammation in the area of the gluteal cleft coupled with mechanical and shear forces (butt crack rubbing together).  These cyst and sinuses are often associated with pits (small openings) that have hair coming out of them.  These cysts and sinuses can be surgically removed but they often recur and the recurrence can be has high as 50% of the the time.    Prior to surgery, options for management include keeping the area clean.  You should remove hair from the gluteal cleft area to decrease the bacterial load and prevent trapping of feces in the area as well as cleaning the area after bowel movements (showering/ washing off), and performing daily to twice daily showering. Removing the hair from the pits on a regular basis with tweezers can also prevent the sinus from getting clogged and infected.  In addition, keeping the area dry and wicking away moisture from the area if needed with dry gauze that is replaced often is also an  option.    Any surgical procedure for the pilonidal cyst/ sinus has a high risk of recurrence (as high as 50%). Surgery is reserved for symptomatic people who fail medical management (above regimen). Surgery historically has consisted of removing large areas of tissue from the gluteal cleft region, which creates large wounds to heal or pack. The healing of a wound in this area can take months and the need for a reoperation is possible.  Newer techniques for pilonidal cyst removal including the minimally invasive, GIPS Procedure, which results in better cosmesis and decreased wound issues.  You would have to flush the biopsy sites with peroxide after this surgery. You can google GIPS procedure, and see Youtube videos.   The risk of any surgery for a pilonidal cyst includes but is not limited to bleeding, infection, re-operation, and recurrence of the cyst.
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
## 2024-09-17 DIAGNOSIS — A630 Anogenital (venereal) warts: Secondary | ICD-10-CM | POA: Insufficient documentation

## 2024-09-19 ENCOUNTER — Encounter: Payer: Self-pay | Admitting: Cardiology

## 2024-09-25 ENCOUNTER — Telehealth: Payer: Self-pay

## 2024-09-25 NOTE — Telephone Encounter (Signed)
 There is a letter and urgent care visit notes that were received via fax from the pt. Pt has an upcoming ov with you on 09/29/24. Please review and advise. Thanks.

## 2024-09-25 NOTE — Telephone Encounter (Signed)
-----   Message from Alan DELENA Lunger sent at 09/25/2024  8:40 AM EDT ----- There is a note to you along with notes from [facility]

## 2024-09-26 ENCOUNTER — Ambulatory Visit: Attending: Internal Medicine | Admitting: Internal Medicine

## 2024-09-26 ENCOUNTER — Encounter: Payer: Self-pay | Admitting: Internal Medicine

## 2024-09-26 VITALS — BP 122/84 | HR 90 | Ht 73.5 in | Wt 247.6 lb

## 2024-09-26 DIAGNOSIS — I442 Atrioventricular block, complete: Secondary | ICD-10-CM

## 2024-09-26 LAB — CUP PACEART INCLINIC DEVICE CHECK
Battery Remaining Longevity: 91 mo
Battery Voltage: 2.91 V
Brady Statistic AP VP Percent: 3.8 %
Brady Statistic AP VS Percent: 1.47 %
Brady Statistic AS VP Percent: 0.01 %
Brady Statistic AS VS Percent: 94.72 %
Brady Statistic RA Percent Paced: 5.28 %
Brady Statistic RV Percent Paced: 3.81 %
Date Time Interrogation Session: 20251031135522
Implantable Lead Connection Status: 753985
Implantable Lead Connection Status: 753985
Implantable Lead Implant Date: 20190123
Implantable Lead Implant Date: 20190123
Implantable Lead Location: 753859
Implantable Lead Location: 753860
Implantable Lead Model: 5076
Implantable Lead Model: 5076
Implantable Pulse Generator Implant Date: 20190123
Lead Channel Impedance Value: 342 Ohm
Lead Channel Impedance Value: 361 Ohm
Lead Channel Impedance Value: 437 Ohm
Lead Channel Impedance Value: 456 Ohm
Lead Channel Pacing Threshold Amplitude: 0.5 V
Lead Channel Pacing Threshold Amplitude: 0.875 V
Lead Channel Pacing Threshold Amplitude: 1 V
Lead Channel Pacing Threshold Amplitude: 1 V
Lead Channel Pacing Threshold Pulse Width: 0.4 ms
Lead Channel Pacing Threshold Pulse Width: 0.4 ms
Lead Channel Pacing Threshold Pulse Width: 0.4 ms
Lead Channel Pacing Threshold Pulse Width: 0.6 ms
Lead Channel Sensing Intrinsic Amplitude: 12.125 mV
Lead Channel Sensing Intrinsic Amplitude: 3.125 mV
Lead Channel Sensing Intrinsic Amplitude: 3.25 mV
Lead Channel Sensing Intrinsic Amplitude: 9.125 mV
Lead Channel Setting Pacing Amplitude: 2 V
Lead Channel Setting Pacing Amplitude: 2.5 V
Lead Channel Setting Pacing Pulse Width: 0.6 ms
Lead Channel Setting Sensing Sensitivity: 1.2 mV
Zone Setting Status: 755011
Zone Setting Status: 755011

## 2024-09-26 MED ORDER — METOPROLOL SUCCINATE ER 50 MG PO TB24: 50.0000 mg | ORAL_TABLET | Freq: Every day | ORAL | 3 refills | Status: DC
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
## 2024-09-26 NOTE — Patient Instructions (Signed)
 Medication Instructions:  Your physician has recommended you make the following change in your medication:   Decrease Toprol  XL to 50 mg Daily    *If you need a refill on your cardiac medications before your next appointment, please call your pharmacy*  Lab Work: NONE   If you have labs (blood work) drawn today and your tests are completely normal, you will receive your results only by: MyChart Message (if you have MyChart) OR A paper copy in the mail If you have any lab test that is abnormal or we need to change your treatment, we will call you to review the results.  Testing/Procedures: NONE   Follow-Up: At [facility], you and your health needs are our priority.  As part of our continuing mission to provide you with exceptional heart care, our providers are all part of one team.  This team includes your primary Cardiologist (physician) and Advanced Practice Providers or APPs (Physician Assistants and Nurse Practitioners) who all work together to provide you with the care you need, when you need it.  Your next appointment:   1 year(s)  Provider:   Donnice Primus, MD    We recommend signing up for the patient portal called MyChart.  Sign up information is provided on this After Visit Summary.  MyChart is used to connect with patients for Virtual Visits (Telemedicine).  Patients are able to view lab/test results, encounter notes, upcoming appointments, etc.  Non-urgent messages can be sent to your provider as well.   To learn more about what you can do with MyChart, go to [email or web address].   Other Instructions Thank you for choosing [facility]!

## 2024-09-29 ENCOUNTER — Ambulatory Visit: Admitting: Internal Medicine

## 2024-09-29 ENCOUNTER — Telehealth: Payer: Self-pay | Admitting: Internal Medicine

## 2024-09-29 ENCOUNTER — Other Ambulatory Visit: Payer: Self-pay | Admitting: *Deleted

## 2024-09-29 ENCOUNTER — Encounter: Payer: Self-pay | Admitting: Internal Medicine

## 2024-09-29 VITALS — BP 111/74 | HR 80 | Temp 99.2°F | Ht 73.5 in | Wt 249.2 lb

## 2024-09-29 DIAGNOSIS — R7989 Other specified abnormal findings of blood chemistry: Secondary | ICD-10-CM

## 2024-09-29 DIAGNOSIS — R1011 Right upper quadrant pain: Secondary | ICD-10-CM
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
## 2024-09-29 NOTE — Patient Instructions (Signed)
 It was good to see you again today!  As you know we had a long discussion today about the cause of your right upper quadrant abdominal pain.  It does not seem to be due to a pinched nerve,  or any upper GI pathology such as peptic ulcer disease or reflux.  Moreover, it does not seem to be coming from your chest.  Even though the ultrasound of your gallbladder and the HIDA came back within normal limits I am suspicious that your gallbladder may be contributing to your symptoms  Therefore, I recommend you go back to see Dr. Morna Pander in consultation and let her assess the cause of  the right upper quadrant abdominal pain.  As far as your fatty liver goes, I feel it is incidental finding.  We we will do something called an ELF which is a blood test that stratifies the risk of fatty liver progressing to scarring and cirrhosis.   As far as fatty liver goes: I recommend the following:  - lose another 25 pounds in the next 12 months  -  aerobic exercise 3 times a week  -  Continue drinking 1 to 2 cups of black coffee daily - is good for your liver  -  follow-up with Dr. Alvan and Dr. Marvine regarding your elevated lipids  -  ask Dr. Marvine  about the timing of your next screening chest CT  -  we will arrange follow-up first of the year here.  Once the ELF is back I will be in touch.  -  we will arrange follow-up the first of the year

## 2024-09-29 NOTE — Telephone Encounter (Signed)
 noted

## 2024-09-29 NOTE — Telephone Encounter (Signed)
 Labs from  [facility] 8/27.  LFTs completely normal triglycerides elevated LDL up to 80 from 60 last year HDL 34 CBC look good hemoglobin 5.9.  Will review with him at next office visit.

## 2024-10-01 ENCOUNTER — Ambulatory Visit: Payer: Self-pay | Admitting: Internal Medicine

## 2024-10-01 ENCOUNTER — Telehealth: Payer: Self-pay

## 2024-10-01 LAB — ENHANCED LIVER FIBROSIS (ELF): ELF(TM) Score: 8.9 (ref <9.80)

## 2024-10-01 NOTE — Telephone Encounter (Signed)
 Pt called stating that he missed your call and is requesting that you call him back. Pt states that Dr. Kallie can not get him in until the first week of December and he is worried.

## 2024-10-08 ENCOUNTER — Ambulatory Visit: Admitting: General Surgery

## 2024-10-08 ENCOUNTER — Encounter: Payer: Self-pay | Admitting: General Surgery

## 2024-10-08 VITALS — BP 100/66 | HR 70 | Temp 98.5°F | Resp 16 | Ht 73.5 in | Wt 246.0 lb

## 2024-10-08 DIAGNOSIS — K811 Chronic cholecystitis: Secondary | ICD-10-CM | POA: Diagnosis not present

## 2024-10-08 DIAGNOSIS — K760 Fatty (change of) liver, not elsewhere classified: Secondary | ICD-10-CM
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
## 2024-10-08 NOTE — Patient Instructions (Signed)
 Will plan for 11/05/24 for surgery. Will get Cardiology to ensure that they they you are safe for surgery given your history.

## 2024-10-09 ENCOUNTER — Encounter: Payer: Self-pay | Admitting: *Deleted

## 2024-10-09 ENCOUNTER — Telehealth (HOSPITAL_BASED_OUTPATIENT_CLINIC_OR_DEPARTMENT_OTHER): Payer: Self-pay

## 2024-10-09 DIAGNOSIS — G4733 Obstructive sleep apnea (adult) (pediatric): Secondary | ICD-10-CM | POA: Insufficient documentation

## 2024-10-09 DIAGNOSIS — E785 Hyperlipidemia, unspecified: Secondary | ICD-10-CM | POA: Insufficient documentation

## 2024-10-09 DIAGNOSIS — E039 Hypothyroidism, unspecified: Secondary | ICD-10-CM | POA: Insufficient documentation

## 2024-10-09 DIAGNOSIS — I2510 Atherosclerotic heart disease of native coronary artery without angina pectoris: Secondary | ICD-10-CM | POA: Insufficient documentation

## 2024-10-09 NOTE — Telephone Encounter (Signed)
   Pre-operative Risk Assessment    Patient Name: Bill Castro  DOB: 07-30-1961 MRN: [id number]   Date of last office visit: 09/26/24 with Dr. Waddell Date of next office visit: 12/04/24 with Dr. Alvan    Request for Surgical Clearance    Procedure:  XI Robotic Assisted Laparoscopic Cholecystectomy liver biopsy, wedge   Date of Surgery:  Clearance 10/29/24                                 Surgeon:  Dr. Kallie Socks Group or Practice Name:  [facility] Phone number:  [phone] Fax number:  [phone]   Type of Clearance Requested:   - Medical  - Pharmacy:  Hold Aspirin  not indicated   Type of Anesthesia:  General    Additional requests/questions:    SignedAugustin JONETTA Daring   10/09/2024, 11:12 AM

## 2024-10-10 NOTE — Telephone Encounter (Signed)
 Hi Dr. Waddell. You recently saw this patient in clinic on 09/26/2024. Are you able to comment on surgical clearance for upcoming laparoscopic cholecystectomy with liver biopsy scheduled for 10/29/2024? Please route your response to P CV DIV PREOP. Thank you!  ~Kaelyn Nauta
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
## 2024-10-13 ENCOUNTER — Telehealth (HOSPITAL_BASED_OUTPATIENT_CLINIC_OR_DEPARTMENT_OTHER): Payer: Self-pay | Admitting: *Deleted

## 2024-10-13 NOTE — Telephone Encounter (Signed)
-----   Message from Bill Castro [place] sent at 10/13/2024  3:32 PM EST ----- Routing to preop to move to the pool.  Bill Castro. Cleaver NP-C  [image]   10/13/2024, 3:32 PM [facility] [address] [place], [place] [address] Office [phone] ----- Message ----- From: Bill Castro, CMA Sent: 10/13/2024   2:35 PM EST To: Cv Div Preop   ----- Message ----- From: Bill Dorn FALCON, MD Sent: 10/13/2024   1:28 PM EST To: [name] Bill, CMA  Can you forward this to preop clinic please  [name] ----- Message ----- From: [name], LPN Sent: 88/86/7974   9:23 AM EST To: [name] Alvan, MD

## 2024-10-13 NOTE — Telephone Encounter (Signed)
   Pre-operative Risk Assessment    Patient Name: Bill Castro  DOB: 02/01/1961 MRN: [id number]   Date of last office visit: 09/26/24  DR. TAYLOR Date of next office visit: 12/04/24 DR. BRANCH  Request for Surgical Clearance    Procedure:  XI ROBOTIC ASSISTED LAPAROSCOPIC CHOLECYSTECTOMY ,Liver Biopsy, Wedge  Date of Surgery:  Clearance 10/29/24                                Surgeon:  DR. MANUELITA BRIDGES Surgeon's Group or Practice Name:  [facility] Phone number:   [phone]    Fax number:   [phone]   Type of Clearance Requested:   - Medical  - Pharmacy:  Hold Aspirin  NOT LISTED ON PREOP FORM; HOWEVER IS ON MED LIST    Type of Anesthesia:  General    Additional requests/questions:    Signed, Lagretta Loseke   10/13/2024, 3:46 PM   Message Received: Today Cleaver, Josefa HERO, NP  Wynetta Niels HERO, CMA Routing to preop to move to the pool.  Josefa HERO. Cleaver NP-C  [image]   10/13/2024, 3:32 PM [facility] [address] [place], [place] [address] Office [phone]       Previous Messages  Letter  Six, Tawni DEL, LPN on 88/86/7974     [facility] [address] [place], [place]  [address] Phone:  [phone]   Fax:  [phone] October 09, 2024    Patient: Bill Castro  Date of Birth: May 18, 1961  Date of Visit: 10/08/2024    Cardiologist: Dorn Ross, MD/ Danelle Birmingham, MD Last OV: 09/26/2024     [facility] Pre-Operative Risk Assessment Fax: [phone]     General Surgeon:  Manuelita Pander, MD   Type of surgery being performed: XI ROBOTIC ASSISTED LAPAROSCOPIC CHOLECYSTECTOMY  Liver Biopsy, Wedge   Anesthesia Type: (none, local, MAC, general)  General   Scheduled Date:  10/29/2024   Type of Clearance Required:  (medical vs pharmacology)  Medical:  CAD (coronary artery disease) Complete heart block   Pacemaker in Place  **CONFIDENTIALITY NOTICE** This message is intended only for the use of the individual or entity to which it is addressed, and may contain information that is privileged, confidential, and exempt from disclosure under applicable law. If the reader of this message is not the intended recipient, you are hereby notified that any dissemination, distribution, or copying of this communication is strictly prohibited. If you have received this communication in error, please notify our Compliance & Privacy Helpline at [phone].    [facility] [address] [phone]

## 2024-10-13 NOTE — Telephone Encounter (Signed)
 Dr. Waddell,  [name] is requesting preoperative cardiac evaluation for robotic assisted lap chole.  He was recently seen by you in clinic on 09/26/2024.  He was doing well at that time.  He presented for PPM follow-up.  He remained stable from a cardiac standpoint.  Please advise on cardiac risk for his upcoming procedure.  May his aspirin  be held prior to his procedure?  Any special requirements related to his device?  Thank you for your help.  Please direct her response to CV DIV preop pool.  Bill HERO. [name] NP-C     10/13/2024, 4:00 PM [facility] [address] [place], [place] [address] Office [phone]

## 2024-10-14 ENCOUNTER — Encounter: Payer: Self-pay | Admitting: Cardiology

## 2024-10-19 NOTE — Telephone Encounter (Signed)
 He may proceed with Lap cholecystectomy. He is an acceptable risk. GT

## 2024-10-20 NOTE — Telephone Encounter (Signed)
   Patient Name: Bill Castro  DOB: 20-Oct-1961 MRN: [id number]  Primary Cardiologist: Alvan Carrier, MD  Chart reviewed as part of pre-operative protocol coverage. Given past medical history and time since last visit, based on ACC/AHA guidelines, Bill Castro is at acceptable risk for the planned procedure without further cardiovascular testing.   Per Dr. Waddell who saw patient in clinic on 09/26/2024: He may proceed with Lap cholecystectomy. He is an acceptable risk.  He may hold aspirin  for 5-7 days prior to procedure. Please resume aspirin  as soon as possible postprocedure, at the discretion of the surgeon.    I will route this recommendation to the requesting party via Epic fax function and remove from pre-op pool.  Please call with questions.  Lum LITTIE Louis, NP 10/20/2024, 9:44 AM

## 2024-10-21 ENCOUNTER — Telehealth: Payer: Self-pay | Admitting: Cardiology

## 2024-10-21 NOTE — Telephone Encounter (Signed)
*  STAT* If patient is at the pharmacy, call can be transferred to refill team.   1. Which medications need to be refilled? (please list name of each medication and dose if known)   atorvastatin  (LIPITOR) 20 MG tablet     2. Would you like to learn more about the convenience, safety, & potential cost savings by using the [facility]? No    3. Are you open to using the [facility] (Type [facility]. No   4. Which pharmacy/location (including street and city if local pharmacy) is medication to be sent to? [id number] - [place], [place] - [address]     5. Do they need a 30 day or 90 day supply? 90 day    Pt dosage was reduced to 20mg , but new Rx was not sent in. Pt requesting Rx to reflect dosage change. Please advise.

## 2024-10-22 ENCOUNTER — Other Ambulatory Visit: Payer: Self-pay

## 2024-10-22 MED ORDER — ESOMEPRAZOLE MAGNESIUM 40 MG PO CPDR: 40.0000 mg | DELAYED_RELEASE_CAPSULE | Freq: Every day | ORAL | 11 refills | Status: AC

## 2024-10-22 MED ORDER — ESOMEPRAZOLE MAGNESIUM 40 MG PO CPDR: 40.0000 mg | DELAYED_RELEASE_CAPSULE | Freq: Two times a day (BID) | ORAL | 11 refills | Status: DC

## 2024-10-22 MED ORDER — ATORVASTATIN CALCIUM 20 MG PO TABS: 20.0000 mg | ORAL_TABLET | Freq: Every day | ORAL | 1 refills | Status: AC

## 2024-10-22 NOTE — Telephone Encounter (Signed)
 Refill sent.

## 2024-10-27 ENCOUNTER — Encounter (HOSPITAL_COMMUNITY)
Admission: RE | Admit: 2024-10-27 | Discharge: 2024-10-27 | Disposition: A | Discharge status: Home / Self Care | Source: Ambulatory Visit | Attending: General Surgery | Admitting: General Surgery

## 2024-10-27 ENCOUNTER — Encounter (HOSPITAL_COMMUNITY): Payer: Self-pay

## 2024-10-27 ENCOUNTER — Other Ambulatory Visit: Payer: Self-pay

## 2024-10-29 ENCOUNTER — Inpatient Hospital Stay (HOSPITAL_COMMUNITY)
Admission: RE | Admit: 2024-10-29 | Discharge: 2024-10-29 | Disposition: A | Discharge status: Home / Self Care | Attending: General Surgery | Admitting: General Surgery

## 2024-10-29 ENCOUNTER — Encounter (HOSPITAL_COMMUNITY): Payer: Self-pay | Admitting: General Surgery

## 2024-10-29 ENCOUNTER — Encounter (HOSPITAL_COMMUNITY)
Admission: RE | Disposition: A | Discharge status: Home / Self Care | Payer: Self-pay | Source: Home / Self Care | Attending: General Surgery

## 2024-10-29 ENCOUNTER — Encounter (HOSPITAL_COMMUNITY): Payer: Self-pay | Admitting: Anesthesiology

## 2024-10-29 ENCOUNTER — Inpatient Hospital Stay (HOSPITAL_COMMUNITY): Payer: Self-pay | Admitting: Anesthesiology

## 2024-10-29 ENCOUNTER — Other Ambulatory Visit: Payer: Self-pay

## 2024-10-29 DIAGNOSIS — F32A Depression, unspecified: Secondary | ICD-10-CM | POA: Insufficient documentation

## 2024-10-29 DIAGNOSIS — F17210 Nicotine dependence, cigarettes, uncomplicated: Secondary | ICD-10-CM

## 2024-10-29 DIAGNOSIS — I2510 Atherosclerotic heart disease of native coronary artery without angina pectoris: Secondary | ICD-10-CM

## 2024-10-29 DIAGNOSIS — K219 Gastro-esophageal reflux disease without esophagitis: Secondary | ICD-10-CM | POA: Insufficient documentation

## 2024-10-29 DIAGNOSIS — K811 Chronic cholecystitis: Secondary | ICD-10-CM

## 2024-10-29 DIAGNOSIS — K759 Inflammatory liver disease, unspecified: Secondary | ICD-10-CM | POA: Insufficient documentation

## 2024-10-29 DIAGNOSIS — K760 Fatty (change of) liver, not elsewhere classified: Secondary | ICD-10-CM | POA: Diagnosis present

## 2024-10-29 DIAGNOSIS — I10 Essential (primary) hypertension: Secondary | ICD-10-CM | POA: Insufficient documentation

## 2024-10-29 DIAGNOSIS — K660 Peritoneal adhesions (postprocedural) (postinfection): Secondary | ICD-10-CM | POA: Insufficient documentation

## 2024-10-29 DIAGNOSIS — E039 Hypothyroidism, unspecified: Secondary | ICD-10-CM | POA: Diagnosis not present

## 2024-10-29 DIAGNOSIS — G4730 Sleep apnea, unspecified: Secondary | ICD-10-CM | POA: Diagnosis not present

## 2024-10-29 DIAGNOSIS — Z950 Presence of cardiac pacemaker: Secondary | ICD-10-CM | POA: Diagnosis not present

## 2024-10-29 HISTORY — PX: ROBOTIC ASSISTED LAPAROSCOPIC LYSIS OF ADHESION: SHX6080

## 2024-10-29 HISTORY — PX: LIVER BIOPSY: SHX301

## 2024-10-29 SURGERY — CHOLECYSTECTOMY, ROBOT-ASSISTED, LAPAROSCOPIC
Anesthesia: General | Site: Abdomen

## 2024-10-29 MED ORDER — ORAL CARE MOUTH RINSE: 15.0000 mL | Freq: Once | OROMUCOSAL | Status: AC

## 2024-10-29 MED ORDER — FENTANYL CITRATE (PF) 100 MCG/2ML IJ SOLN
INTRAMUSCULAR | Status: AC
  Filled 2024-10-29: qty 2

## 2024-10-29 MED ORDER — CIPROFLOXACIN IN D5W 400 MG/200ML IV SOLN
INTRAVENOUS | Status: AC
  Filled 2024-10-29: qty 200

## 2024-10-29 MED ORDER — OXYCODONE HCL 5 MG/5ML PO SOLN: 5.0000 mg | Freq: Once | ORAL | Status: AC | PRN

## 2024-10-29 MED ORDER — FENTANYL CITRATE (PF) 100 MCG/2ML IJ SOLN
INTRAMUSCULAR | Status: DC | PRN
  Administered 2024-10-29: 75 ug via INTRAVENOUS
  Administered 2024-10-29 (×2): 50 ug via INTRAVENOUS
  Administered 2024-10-29: 25 ug via INTRAVENOUS

## 2024-10-29 MED ORDER — CHLORHEXIDINE GLUCONATE CLOTH 2 % EX PADS: 6.0000 | MEDICATED_PAD | Freq: Once | CUTANEOUS | Status: DC

## 2024-10-29 MED ORDER — ROCURONIUM BROMIDE 100 MG/10ML IV SOLN
INTRAVENOUS | Status: DC | PRN
  Administered 2024-10-29: 10 mg via INTRAVENOUS
  Administered 2024-10-29: 50 mg via INTRAVENOUS

## 2024-10-29 MED ORDER — ONDANSETRON HCL 4 MG/2ML IJ SOLN
INTRAMUSCULAR | Status: DC | PRN
  Administered 2024-10-29: 4 mg via INTRAVENOUS

## 2024-10-29 MED ORDER — CIPROFLOXACIN IN D5W 400 MG/200ML IV SOLN
400.0000 mg | INTRAVENOUS | Status: AC
  Administered 2024-10-29: 400 mg via INTRAVENOUS

## 2024-10-29 MED ORDER — INDOCYANINE GREEN 25 MG IJ SOLR
INTRAMUSCULAR | Status: DC
  Filled 2024-10-29: qty 10

## 2024-10-29 MED ORDER — PROPOFOL 10 MG/ML IV BOLUS
INTRAVENOUS | Status: DC | PRN
  Administered 2024-10-29: 200 mg via INTRAVENOUS

## 2024-10-29 MED ORDER — LACTATED RINGERS IV SOLN: INTRAVENOUS | Status: DC

## 2024-10-29 MED ORDER — MIDAZOLAM HCL 2 MG/2ML IJ SOLN
INTRAMUSCULAR | Status: AC
  Filled 2024-10-29: qty 2

## 2024-10-29 MED ORDER — INDOCYANINE GREEN 25 MG IV SOLR
2.5000 mg | Freq: Once | INTRAVENOUS | Status: AC
  Administered 2024-10-29: 2.5 mg via INTRAVENOUS
  Filled 2024-10-29: qty 10

## 2024-10-29 MED ORDER — CHLORHEXIDINE GLUCONATE 0.12 % MT SOLN
15.0000 mL | Freq: Once | OROMUCOSAL | Status: AC
  Administered 2024-10-29: 15 mL via OROMUCOSAL

## 2024-10-29 MED ORDER — BUPIVACAINE HCL (PF) 0.5 % IJ SOLN
INTRAMUSCULAR | Status: AC
  Filled 2024-10-29: qty 30

## 2024-10-29 MED ORDER — OXYCODONE HCL 5 MG PO TABS
5.0000 mg | ORAL_TABLET | Freq: Once | ORAL | Status: AC | PRN
  Administered 2024-10-29: 5 mg via ORAL
  Filled 2024-10-29: qty 1

## 2024-10-29 MED ORDER — SIMETHICONE 40 MG/0.6ML PO SUSP
ORAL | Status: AC
  Filled 2024-10-29: qty 0.6

## 2024-10-29 MED ORDER — HYDROMORPHONE HCL 1 MG/ML IJ SOLN
0.2500 mg | INTRAMUSCULAR | Status: DC | PRN
  Administered 2024-10-29 (×2): 0.5 mg via INTRAVENOUS
  Filled 2024-10-29 (×2): qty 0.5

## 2024-10-29 MED ORDER — OXYCODONE HCL 5 MG PO TABS: 5.0000 mg | ORAL_TABLET | ORAL | 0 refills | Status: AC | PRN

## 2024-10-29 MED ORDER — ONDANSETRON HCL 4 MG PO TABS: 4.0000 mg | ORAL_TABLET | Freq: Three times a day (TID) | ORAL | 1 refills | Status: DC | PRN

## 2024-10-29 MED ORDER — LIDOCAINE 2% (20 MG/ML) 5 ML SYRINGE
INTRAMUSCULAR | Status: DC | PRN
  Administered 2024-10-29: 75 mg via INTRAVENOUS

## 2024-10-29 MED ORDER — STERILE WATER FOR IRRIGATION IR SOLN
Status: DC | PRN
  Administered 2024-10-29: 1000 mL

## 2024-10-29 MED ORDER — CHLORHEXIDINE GLUCONATE CLOTH 2 % EX PADS
6.0000 | MEDICATED_PAD | Freq: Once | CUTANEOUS | Status: AC
  Administered 2024-10-29: 6 via TOPICAL

## 2024-10-29 MED ORDER — SUGAMMADEX SODIUM 200 MG/2ML IV SOLN
INTRAVENOUS | Status: DC | PRN
  Administered 2024-10-29: 200 mg via INTRAVENOUS

## 2024-10-29 MED ORDER — BUPIVACAINE HCL (PF) 0.5 % IJ SOLN
INTRAMUSCULAR | Status: DC | PRN
  Administered 2024-10-29: 30 mL

## 2024-10-29 SURGICAL SUPPLY — 37 items
APPLICATOR COTTON TIP 6 STRL (MISCELLANEOUS) IMPLANT
BLADE SURG 15 STRL LF DISP TIS (BLADE) ×1 IMPLANT
CAUTERY HOOK MNPLR 1.6 DVNC XI (INSTRUMENTS) ×1 IMPLANT
CHLORAPREP W/TINT 26 (MISCELLANEOUS) ×1 IMPLANT
CLIP LIGATING HEM O LOK PURPLE (MISCELLANEOUS) ×1 IMPLANT
COVER LIGHT HANDLE STERIS (MISCELLANEOUS) ×2 IMPLANT
DERMABOND ADVANCED .7 DNX12 (GAUZE/BANDAGES/DRESSINGS) ×1 IMPLANT
DRAPE ARM DVNC X/XI (DISPOSABLE) ×4 IMPLANT
DRAPE COLUMN DVNC XI (DISPOSABLE) ×1 IMPLANT
ELECTRODE REM PT RTRN 9FT ADLT (ELECTROSURGICAL) ×1 IMPLANT
FORCEPS BPLR R/ABLATION 8 DVNC (INSTRUMENTS) ×1 IMPLANT
FORCEPS PROGRASP DVNC XI (FORCEP) ×1 IMPLANT
GLOVE BIO SURGEON STRL SZ 6.5 (GLOVE) ×2 IMPLANT
GLOVE BIOGEL PI IND STRL 6.5 (GLOVE) ×2 IMPLANT
GLOVE BIOGEL PI IND STRL 7.0 (GLOVE) ×2 IMPLANT
GOWN STRL REUS W/TWL LRG LVL3 (GOWN DISPOSABLE) ×3 IMPLANT
GRASPER SUT TROCAR 14GX15 (MISCELLANEOUS) ×1 IMPLANT
IRRIGATOR SUCT 8 DISP DVNC XI (IRRIGATION / IRRIGATOR) IMPLANT
KIT TURNOVER KIT A (KITS) ×1 IMPLANT
LHOOK LAP DISP 36CM (ELECTROSURGICAL) IMPLANT
NDL HYPO 21X1.5 SAFETY (NEEDLE) ×1 IMPLANT
NDL INSUFFLATION 14GA 120MM (NEEDLE) ×1 IMPLANT
NEEDLE HYPO 21X1.5 SAFETY (NEEDLE) ×1 IMPLANT
NEEDLE INSUFFLATION 14GA 120MM (NEEDLE) ×1 IMPLANT
OBTURATOR OPTICALSTD 8 DVNC (TROCAR) ×1 IMPLANT
PACK LAP CHOLE LZT030E (CUSTOM PROCEDURE TRAY) ×1 IMPLANT
PAD ARMBOARD POSITIONER FOAM (MISCELLANEOUS) ×1 IMPLANT
PENCIL HANDSWITCHING (ELECTRODE) ×1 IMPLANT
POSITIONER HEAD 8X9X4 ADT (SOFTGOODS) ×1 IMPLANT
SEAL UNIV 5-12 XI (MISCELLANEOUS) ×4 IMPLANT
SET BASIN LINEN APH (SET/KITS/TRAYS/PACK) ×1 IMPLANT
SET TUBE DA VINCI INSUFFLATOR (TUBING) IMPLANT
SUT MNCRL AB 4-0 PS2 18 (SUTURE) ×2 IMPLANT
SUT VICRYL 0 UR6 27IN ABS (SUTURE) ×1 IMPLANT
SYR 30ML LL (SYRINGE) ×1 IMPLANT
SYSTEM RETRIEVL 5MM INZII UNIV (BASKET) ×1 IMPLANT
WATER STERILE IRR 500ML POUR (IV SOLUTION) ×1 IMPLANT

## 2024-10-29 NOTE — Anesthesia Postprocedure Evaluation (Signed)
 Anesthesia Post Note  Patient: Bill Castro  Procedure(s) Performed: CHOLECYSTECTOMY, ROBOT-ASSISTED, LAPAROSCOPIC (Abdomen) BIOPSY, LIVER (Abdomen) LYSIS, ADHESIONS, ROBOT-ASSISTED, LAPAROSCOPIC (Abdomen)  Patient location during evaluation: PACU Anesthesia Type: General Level of consciousness: awake and alert Pain management: pain level controlled Vital Signs Assessment: post-procedure vital signs reviewed and stable Respiratory status: spontaneous breathing, nonlabored ventilation, respiratory function stable and patient connected to nasal cannula oxygen Cardiovascular status: blood pressure returned to baseline and stable Postop Assessment: no apparent nausea or vomiting Anesthetic complications: no   No notable events documented.   Last Vitals:  Vitals:   10/29/24 1114 10/29/24 1115  BP: (!) 165/96 (!) 165/96  Pulse: 85 83  Resp: (!) 22 (!) 25  Temp:    SpO2: 96% 96%    Last Pain:  Vitals:   10/29/24 1114  PainSc: 7                  Andrea Limes

## 2024-10-29 NOTE — Op Note (Signed)
 [facility] Operative Note  10/29/24  Preoperative Diagnosis: Chronic cholecystitis and fatty liver   Postoperative Diagnosis: Same   Procedure(s) Performed: Robotic Assisted Laparoscopic Cholecystectomy and liver biopsy (1cm piece), lysis of adhesions    Surgeon: Manuelita BROCKS. Kallie, MD   Assistants: No qualified resident was available    Anesthesia: General endotracheal   Anesthesiologist: Herschell Hollering, MD    Specimens: Gallbladder   Estimated Blood Loss: Minimal   Blood Replacement: None    Complications: None   Wound Class: Clean contaminated   Operative Indications: The patient was found to have no stones on imaging but was symptomatic and GI felt it was his gallbladder.  We discussed the risk of the procedure including but not limited to bleeding, infection, injury to the common bile duct, bile leak, need for further procedures, chance of subtotal cholecystectomy. He also had evidence of fatty liver and we discussed a liver biopsy to have the gold standard to diagnosis him with any fibrosis.   Findings:  Critical view of safety noted All clips intact at the end of the case Adequate hemostasis Adhesion of omentum to the liver and anterior abdominal wall.   Procedure: Firefly was given in the preoperative area. The patient was taken to the operating room and placed supine. General endotracheal anesthesia was induced. Intravenous antibiotics were  administered per protocol.  An orogastric tube positioned to decompress the stomach. The abdomen was prepared and draped in the usual sterile fashion.   Veress needle was placed at Palmer's point and insufflation was started after confirming a positive saline drop test and no immediate increase in abdominal pressure.  After reaching 15 mm, a 8 mm port was placed via optiview technique supraumbilical, measuring 20 mm away from the suspected position of the gallbladder.  The abdomen was inspected and no  abnormalities or injuries were found under the port or veress which was removed.  Under direct vision, ports were placed in the following locations in a semi curvilinear position around the target of the gallbladder. A 8 mm port placed on the patient's left 8 cm from the umbilical port. There were adhesions I had to clear on the anterior abdominal wall on the right side with hook cautery before placing the right sided ports. Two 8 mm ports on the patient's right each having 8cm clearance to the adjacent ports. Once ports were placed, the table was placed in the reverse Trendelenburg position with the right side up. The robot platform was brought into the operative field and docked to the ports successfully.  An endoscope was placed through the umbilical port, prograsper through the most lateral right port, forced bipolar to the port just right of the umbilicus, and then a hook cautery in the left port.  There was omentum adherent to the liver and I had to take this down with cautery to get the gallbladder exposed.  The dome of the gallbladder was grasped with prograsp and retracted over the dome of the liver. Adhesions between the gallbladder and omentum, duodenum and transverse colon were lysed via hook cautery. The infundibulum was grasped with the forced bipolar and retracted toward the right lower quadrant. This maneuver exposed Calot's triangle. Firefly was used throughout the dissection to ensure safe visualization of the cystic duct.The peritoneum overlying the gallbladder infundibulum was then dissected and the cystic duct and cystic artery identified.  Critical view of safety with the liver bed clearly visible behind the duct and artery with no additional structures noted.  The cystic duct and cystic artery were doubly clipped and divided close to the gallbladder.    The gallbladder was then dissected from its peritoneal and liver bed attachments by electrocautery. A 1 cm piece of liver was biopsied  with cautery. Hemostasis was checked prior to removing the hook cautery.  A 5mm Endo Catch bag was then placed through the left side port. The robot was undocked and moved out of the field,  and the gallbladder and liver biopsy were removed in the bag.  The gallbladder was passed off the table as a specimen. There was no evidence of bleeding from the gallbladder fossa or cystic artery or leakage of the bile from the cystic duct stump. The left port site closed with a 0 vicryl and PMI due to dilation from removing the gallbladder.The abdomen was desufflated and secondary trocars were removed under direct vision.   No bleeding was noted. All skin incisions were closed with subcuticular sutures of 4-0 monocryl and dermabond.   Final inspection revealed acceptable hemostasis. All counts were correct at the end of the case. The patient was awakened from anesthesia and extubated without complication. The OG tube was removed.  The patient went to the PACU in stable condition.   Manuelita Pander, MD [facility] [address] [place], [place] [address] [phone] (office)

## 2024-10-29 NOTE — Anesthesia Preprocedure Evaluation (Signed)
 Anesthesia Evaluation  Patient identified by MRN, date of birth, ID band Patient awake    Reviewed: Allergy & Precautions, H&P , NPO status , Patient's Chart, lab work & pertinent test results  History of Anesthesia Complications (+) Family history of anesthesia reaction  Airway Mallampati: II  TM Distance: >3 FB Neck ROM: Full    Dental no notable dental hx.    Pulmonary sleep apnea , Current Smoker and Patient abstained from smoking.   Pulmonary exam normal breath sounds clear to auscultation       Cardiovascular hypertension, + CAD and + DOE  Normal cardiovascular exam+ dysrhythmias + pacemaker  Rhythm:Regular Rate:Normal  Permanent pacemaker secondary to CHB 2003 2019 echo ef 65-70%   Neuro/Psych  PSYCHIATRIC DISORDERS  Depression    negative neurological ROS     GI/Hepatic Neg liver ROS,GERD  ,,  Endo/Other  Hypothyroidism    Renal/GU negative Renal ROS  negative genitourinary   Musculoskeletal negative musculoskeletal ROS (+)    Abdominal   Peds negative pediatric ROS (+)  Hematology negative hematology ROS (+)   Anesthesia Other Findings   Reproductive/Obstetrics negative OB ROS                              Anesthesia Physical Anesthesia Plan  ASA: 3  Anesthesia Plan: General   Post-op Pain Management:    Induction: Intravenous  PONV Risk Score and Plan:   Airway Management Planned: Oral ETT  Additional Equipment:   Intra-op Plan:   Post-operative Plan: Extubation in OR  Informed Consent: I have reviewed the patients History and Physical, chart, labs and discussed the procedure including the risks, benefits and alternatives for the proposed anesthesia with the patient or authorized representative who has indicated his/her understanding and acceptance.     Dental advisory given  Plan Discussed with: CRNA  Anesthesia Plan Comments:         Anesthesia  Quick Evaluation

## 2024-10-29 NOTE — Progress Notes (Signed)
 [facility]  Updated his family. Plan to go home. Rx sent to pharmacy.  Manuelita Pander, MD [facility] [address] [place], [place] [address] [phone] (office)

## 2024-10-29 NOTE — Discharge Instructions (Signed)
 Discharge Robotic Assisted Laparoscopic Surgery Instructions:  Common Complaints: Right shoulder pain is common after laparoscopic surgery.  This is secondary to the gas used in the surgery being trapped under the diaphragm.  Walk to help your body absorb the gas. This will improve in a few days. Pain at the port sites are common, especially the larger port sites. This will improve with time.  Some nausea is common and poor appetite. The main goal is to stay hydrated the first few days after surgery.   Diet/ Activity: Diet as tolerated. You may not have an appetite, but it is important to stay hydrated.  Drink 64 ounces of water  a day. Your appetite will return with time.  Shower per your regular routine daily.  Do not take hot showers. Take warm showers that are less than 10 minutes. Rest and listen to your body, but do not remain in bed all day.  Walk everyday for at least 15-20 minutes. Deep cough and move around every 1-2 hours in the first few days after surgery.  Do not lift > 10 lbs, perform excessive bending, pushing, pulling, squatting for 1-2 weeks after surgery.  Do not pick at the dermabond glue on your incision sites.  This glue film will remain in place for 1-2 weeks and will start to peel off.  Do not place lotions or balms on your incision unless instructed to specifically by Dr. Kallie.   Pain Expectations and Narcotics: -After surgery you will have pain associated with your incisions and this is normal. The pain is muscular and nerve pain, and will get better with time. -You are encouraged and expected to take non narcotic medications like tylenol  and ibuprofen  (when able) to treat pain as multiple modalities can aid with pain treatment. -Narcotics are only used when pain is severe or there is breakthrough pain. -You are not expected to have a pain score of 0 after surgery, as we cannot prevent pain. A pain score of 3-4 that allows you to be functional, move, walk, and  tolerate some activity is the goal. The pain will continue to improve over the days after surgery and is dependent on your surgery. -Due to [place] law, we are only able to give a certain amount of pain medication to treat post operative pain, and we only give additional narcotics on a patient by patient basis.  -For most laparoscopic surgery, studies have shown that the majority of patients only need 10-15 narcotic pills, and for open surgeries most patients only need 15-20.   -Having appropriate expectations of pain and knowledge of pain management with non narcotics is important as we do not want anyone to become addicted to narcotic pain medication.  -Using ice packs in the first 48 hours and heating pads after 48 hours, wearing an abdominal binder (when recommended), and using over the counter medications are all ways to help with pain management.   -Simple acts like meditation and mindfulness practices after surgery can also help with pain control and research has proven the benefit of these practices.  Medication: Take tylenol  and ibuprofen  as needed for pain control, alternating every 4-6 hours.  Example:  Tylenol  1000mg  @ 6am, 12noon, 6pm, (Do not exceed 4000mg  of tylenol  a day). Ibuprofen  800mg  @ 9am, 3pm, 9pm, 3am (Do not exceed 3600mg  of ibuprofen  a day).  Take Roxicodone  for breakthrough pain every 4 hours.  Take Colace for constipation related to narcotic pain medication. If you do not have a bowel movement in  2 days, take Miralax over the counter.  Drink plenty of water  to also prevent constipation.   Contact Information: If you have questions or concerns, please call our office, [phone], Monday- Thursday 8AM-5PM and Friday 8AM-12Noon.  If it is after hours or on the weekend, please call [facility] Main [facility], [phone], [phone], and ask to speak to the surgeon on call for Dr. Kallie at [facility].

## 2024-10-29 NOTE — Transfer of Care (Signed)
 Immediate Anesthesia Transfer of Care Note  Patient: Bill Castro  Procedure(s) Performed: CHOLECYSTECTOMY, ROBOT-ASSISTED, LAPAROSCOPIC (Abdomen) BIOPSY, LIVER (Abdomen) LYSIS, ADHESIONS, ROBOT-ASSISTED, LAPAROSCOPIC (Abdomen)  Patient Location: PACU  Anesthesia Type:General  Level of Consciousness: awake  Airway & Oxygen Therapy: Patient Spontanous Breathing  Post-op Assessment: Report given to RN  Post vital signs: Reviewed and stable  Last Vitals:  Vitals Value Taken Time  BP 144/87 10/29/24 11:00  Temp 36.5 C 10/29/24 10:58  Pulse 88 10/29/24 11:02  Resp 17 10/29/24 11:02  SpO2 95 % 10/29/24 11:02  Vitals shown include unfiled device data.  Last Pain: There were no vitals filed for this visit.       Complications: No notable events documented.

## 2024-10-29 NOTE — Progress Notes (Signed)
 [facility]  Patient is worried about needing admission for the liver biopsy. Medicare has said he would need admission for the liver biopsy. I have discussed with patient. I am getting the office to check again and verify why this is the case, and make sure that he will not need to stay.   If I have verified with the office that I can do the liver biopsy and him not require admission. If I am doing the laparoscopic liver biopsy during the cholecystectomy, the [place] be 47[address].   The office is verifying that he should be able to go home.   Manuelita Pander, MD [facility] [address] [place], [place] [address] [phone] (office)

## 2024-10-29 NOTE — Interval H&P Note (Signed)
 History and Physical Interval Note:  10/29/2024 8:41 AM  Bill Castro  has presented today for surgery, with the diagnosis of CHRONIC CHOLECYSTITIS  FATTY LIVER.  The various methods of treatment have been discussed with the patient and family. After consideration of risks, benefits and other options for treatment, the patient has consented to  Procedure(s): CHOLECYSTECTOMY, ROBOT-ASSISTED, LAPAROSCOPIC (N/A) BIOPSY, LIVER (N/A) as a surgical intervention.  The patient's history has been reviewed, patient examined, no change in status, stable for surgery.  I have reviewed the patient's chart and labs.  Questions were answered to the patient's satisfaction.     Manuelita JAYSON Pander

## 2024-10-29 NOTE — Anesthesia Procedure Notes (Signed)
 Procedure Name: Intubation Date/Time: 10/29/2024 9:14 AM  Performed by: Toribio Darice BRA[place], CRNAPre-anesthesia Checklist: Patient identified, Patient being monitored, Timeout performed, Emergency Drugs available and Suction available Patient Re-evaluated:Patient Re-evaluated prior to induction Oxygen Delivery Method: Circle system utilized Preoxygenation: Pre-oxygenation with 100% oxygen Induction Type: IV induction Ventilation: Mask ventilation without difficulty Laryngoscope Size: Mac and 3 Grade View: Grade I Tube type: Oral Tube size: 7.0 mm Number of attempts: 1 Airway Equipment and Method: Stylet Placement Confirmation: ETT inserted through vocal cords under direct vision, positive ETCO2 and breath sounds checked- equal and bilateral Secured at: 21 cm Tube secured with: Tape Dental Injury: Teeth and Oropharynx as per pre-operative assessment

## 2024-10-30 ENCOUNTER — Ambulatory Visit: Admitting: General Surgery

## 2024-10-30 ENCOUNTER — Ambulatory Visit: Payer: Self-pay | Admitting: General Surgery

## 2024-10-30 ENCOUNTER — Encounter (HOSPITAL_COMMUNITY): Payer: Self-pay | Admitting: General Surgery

## 2024-10-30 LAB — SURGICAL PATHOLOGY

## 2024-10-30 NOTE — Progress Notes (Signed)
 Let patient know pathology reassuring. Benign liver biopsy and non specific mild inflammation. Chronic cholecystitis as expected.

## 2024-11-03 ENCOUNTER — Other Ambulatory Visit (HOSPITAL_COMMUNITY)

## 2024-11-04 ENCOUNTER — Ambulatory Visit: Admitting: Internal Medicine

## 2024-11-05 ENCOUNTER — Other Ambulatory Visit (HOSPITAL_COMMUNITY): Payer: Self-pay | Admitting: Family Medicine

## 2024-11-05 DIAGNOSIS — F17200 Nicotine dependence, unspecified, uncomplicated: Secondary | ICD-10-CM

## 2024-11-07 ENCOUNTER — Encounter: Payer: Self-pay | Admitting: Cardiology

## 2024-11-10 ENCOUNTER — Ambulatory Visit (HOSPITAL_COMMUNITY): Admission: RE | Admit: 2024-11-10 | Discharge: 2024-11-10 | Attending: Family Medicine | Admitting: Family Medicine

## 2024-11-10 DIAGNOSIS — Z122 Encounter for screening for malignant neoplasm of respiratory organs: Secondary | ICD-10-CM | POA: Diagnosis not present

## 2024-11-10 DIAGNOSIS — I700 Atherosclerosis of aorta: Secondary | ICD-10-CM | POA: Insufficient documentation

## 2024-11-10 DIAGNOSIS — F17210 Nicotine dependence, cigarettes, uncomplicated: Secondary | ICD-10-CM | POA: Insufficient documentation

## 2024-11-10 DIAGNOSIS — F17200 Nicotine dependence, unspecified, uncomplicated: Secondary | ICD-10-CM | POA: Insufficient documentation

## 2024-11-10 DIAGNOSIS — I2510 Atherosclerotic heart disease of native coronary artery without angina pectoris: Secondary | ICD-10-CM | POA: Insufficient documentation

## 2024-11-10 DIAGNOSIS — J439 Emphysema, unspecified: Secondary | ICD-10-CM | POA: Insufficient documentation

## 2024-11-11 ENCOUNTER — Telehealth: Payer: Self-pay | Admitting: Cardiology

## 2024-11-11 NOTE — Telephone Encounter (Signed)
 Patient calling in about labs he will need before his next visit. States orders need to be put in for it. Please advise

## 2024-11-12 ENCOUNTER — Ambulatory Visit: Admitting: General Surgery

## 2024-11-12 DIAGNOSIS — K811 Chronic cholecystitis: Secondary | ICD-10-CM

## 2024-11-12 DIAGNOSIS — K760 Fatty (change of) liver, not elsewhere classified: Secondary | ICD-10-CM

## 2024-11-12 NOTE — Telephone Encounter (Signed)
 Lipid panel entered for [facility]- MyChart message sent to patient.

## 2024-11-12 NOTE — Progress Notes (Signed)
 [facility]  I am calling the patient for post operative evaluation. This is not a billable encounter as it is under the global charges for the surgery.  The patient had a robotic cholecystectomy on 10/29/24. The patient reports that he is doing pretty good after the first few days. The are tolerating a diet, having good pain control, and having regular Bms.  The incisions are healing. There is a spot on the right side that is sore but no redness extending out. The patient has no major concerns.   Pathology: FINAL MICROSCOPIC DIAGNOSIS:   A. GALLBLADDER, CHOLECYSTECTOMY:       Chronic cholecystitis.   B. LIVER, BIOPSY:       Benign hepatic parenchyma with non-specific portal inflammation.       No evidence of advanced fibrosis.   Will see the patient PRN.   Manuelita Pander, MD [facility] [address] [place] [address] [place], [place] [address] [phone] (office)

## 2024-11-26 ENCOUNTER — Other Ambulatory Visit (HOSPITAL_COMMUNITY)
Admission: RE | Admit: 2024-11-26 | Discharge: 2024-11-26 | Disposition: A | Discharge status: Home / Self Care | Source: Ambulatory Visit | Attending: Cardiology | Admitting: Cardiology

## 2024-11-26 DIAGNOSIS — E782 Mixed hyperlipidemia: Secondary | ICD-10-CM | POA: Insufficient documentation

## 2024-11-26 LAB — LIPID PANEL
Cholesterol: 113 mg/dL (ref 0–200)
HDL: 34 mg/dL — ABNORMAL LOW
LDL Cholesterol: 45 mg/dL (ref 0–99)
Total CHOL/HDL Ratio: 3.3 ratio
Triglycerides: 172 mg/dL — ABNORMAL HIGH
VLDL: 34 mg/dL (ref 0–40)

## 2024-11-30 ENCOUNTER — Encounter: Payer: Self-pay | Admitting: Cardiology

## 2024-12-02 ENCOUNTER — Ambulatory Visit: Payer: Commercial Managed Care - PPO

## 2024-12-02 DIAGNOSIS — I442 Atrioventricular block, complete: Secondary | ICD-10-CM

## 2024-12-03 ENCOUNTER — Ambulatory Visit: Payer: Self-pay | Admitting: Student in an Organized Health Care Education/Training Program

## 2024-12-03 ENCOUNTER — Other Ambulatory Visit: Payer: Self-pay

## 2024-12-03 LAB — CUP PACEART REMOTE DEVICE CHECK
Battery Remaining Longevity: 89 mo
Battery Voltage: 2.9 V
Brady Statistic AP VP Percent: 18.56 %
Brady Statistic AP VS Percent: 3.52 %
Brady Statistic AS VP Percent: 0.04 %
Brady Statistic AS VS Percent: 77.87 %
Brady Statistic RA Percent Paced: 22.17 %
Brady Statistic RV Percent Paced: 18.61 %
Date Time Interrogation Session: 20260105234435
Implantable Lead Connection Status: 753985
Implantable Lead Connection Status: 753985
Implantable Lead Implant Date: 20190123
Implantable Lead Implant Date: 20190123
Implantable Lead Location: 753859
Implantable Lead Location: 753860
Implantable Lead Model: 5076
Implantable Lead Model: 5076
Implantable Pulse Generator Implant Date: 20190123
Lead Channel Impedance Value: 304 Ohm
Lead Channel Impedance Value: 361 Ohm
Lead Channel Impedance Value: 399 Ohm
Lead Channel Impedance Value: 437 Ohm
Lead Channel Pacing Threshold Amplitude: 0.875 V
Lead Channel Pacing Threshold Amplitude: 1 V
Lead Channel Pacing Threshold Pulse Width: 0.4 ms
Lead Channel Pacing Threshold Pulse Width: 0.4 ms
Lead Channel Sensing Intrinsic Amplitude: 1.375 mV
Lead Channel Sensing Intrinsic Amplitude: 1.375 mV
Lead Channel Sensing Intrinsic Amplitude: 8.75 mV
Lead Channel Sensing Intrinsic Amplitude: 8.75 mV
Lead Channel Setting Pacing Amplitude: 2 V
Lead Channel Setting Pacing Amplitude: 2.5 V
Lead Channel Setting Pacing Pulse Width: 0.6 ms
Lead Channel Setting Sensing Sensitivity: 1.2 mV
Zone Setting Status: 755011
Zone Setting Status: 755011

## 2024-12-03 MED ORDER — METOPROLOL SUCCINATE ER 25 MG PO TB24: 25.0000 mg | ORAL_TABLET | Freq: Every day | ORAL | 3 refills | Status: AC

## 2024-12-04 ENCOUNTER — Ambulatory Visit: Attending: Cardiology | Admitting: Cardiology

## 2024-12-04 ENCOUNTER — Encounter: Payer: Self-pay | Admitting: Cardiology

## 2024-12-04 NOTE — Patient Instructions (Signed)
 Medication Instructions:  Your physician recommends that you continue on your current medications as directed. Please refer to the Current Medication list given to you today.  *If you need a refill on your cardiac medications before your next appointment, please call your pharmacy*  Lab Work: None If you have labs (blood work) drawn today and your tests are completely normal, you will receive your results only by: MyChart Message (if you have MyChart) OR A paper copy in the mail If you have any lab test that is abnormal or we need to change your treatment, we will call you to review the results.  Testing/Procedures: None  Follow-Up: At [facility], you and your health needs are our priority.  As part of our continuing mission to provide you with exceptional heart care, our providers are all part of one team.  This team includes your primary Cardiologist (physician) and Advanced Practice Providers or APPs (Physician Assistants and Nurse Practitioners) who all work together to provide you with the care you need, when you need it.  Your next appointment:   3 month(s)  Provider:   You may see Alvan Carrier, MD or one of the following Advanced Practice Providers on your designated Care Team:   Laymon Qua, PA-C  [place], [place] Olivia [place], [place]     We recommend signing up for the patient portal called MyChart.  Sign up information is provided on this After Visit Summary.  MyChart is used to connect with patients for Virtual Visits (Telemedicine).  Patients are able to view lab/test results, encounter notes, upcoming appointments, etc.  Non-urgent messages can be sent to your provider as well.   To learn more about what you can do with MyChart, go to [email or web address].   Other Instructions Thank you for choosing [facility]!
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
## 2024-12-08 NOTE — Progress Notes (Signed)
 Remote PPM Transmission

## 2024-12-16 ENCOUNTER — Telehealth: Payer: Self-pay

## 2024-12-16 ENCOUNTER — Ambulatory Visit: Payer: Self-pay | Admitting: Cardiology

## 2024-12-16 NOTE — Telephone Encounter (Signed)
 Copied from CRM [id number]. Topic: Clinical - Lab/Test Results >> Dec 16, 2024  1:06 PM Dedra B wrote: Reason for CRM: Patient wants to inform clinic that he will be dropping off a disc with the images from his last CT chest scan.  Called to inform pt him we do not have a card reader her and that we are actually able to see his CT results. Pt confirmed understanding

## 2024-12-22 ENCOUNTER — Ambulatory Visit: Admitting: Internal Medicine

## 2025-01-19 ENCOUNTER — Ambulatory Visit: Admitting: Internal Medicine

## 2025-03-16 ENCOUNTER — Ambulatory Visit: Admitting: Cardiology
